# Patient Record
Sex: Female | Born: 1955 | Race: White | Hispanic: No | Smoking: Never smoker
Health system: Southern US, Community
[De-identification: ages and names within clinical notes are randomized; demographics above are authoritative.]

## PROBLEM LIST (undated history)

## (undated) DIAGNOSIS — G5600 Carpal tunnel syndrome, unspecified upper limb: Secondary | ICD-10-CM

## (undated) DIAGNOSIS — K760 Fatty (change of) liver, not elsewhere classified: Secondary | ICD-10-CM

## (undated) DIAGNOSIS — F32A Depression, unspecified: Secondary | ICD-10-CM

## (undated) DIAGNOSIS — K648 Other hemorrhoids: Secondary | ICD-10-CM

## (undated) DIAGNOSIS — I82409 Acute embolism and thrombosis of unspecified deep veins of unspecified lower extremity: Secondary | ICD-10-CM

## (undated) DIAGNOSIS — K219 Gastro-esophageal reflux disease without esophagitis: Secondary | ICD-10-CM

## (undated) DIAGNOSIS — E119 Type 2 diabetes mellitus without complications: Secondary | ICD-10-CM

## (undated) DIAGNOSIS — K222 Esophageal obstruction: Secondary | ICD-10-CM

## (undated) DIAGNOSIS — B029 Zoster without complications: Secondary | ICD-10-CM

## (undated) DIAGNOSIS — D126 Benign neoplasm of colon, unspecified: Secondary | ICD-10-CM

## (undated) DIAGNOSIS — F419 Anxiety disorder, unspecified: Secondary | ICD-10-CM

## (undated) DIAGNOSIS — D689 Coagulation defect, unspecified: Secondary | ICD-10-CM

## (undated) DIAGNOSIS — Z9889 Other specified postprocedural states: Secondary | ICD-10-CM

## (undated) DIAGNOSIS — K439 Ventral hernia without obstruction or gangrene: Secondary | ICD-10-CM

## (undated) DIAGNOSIS — I2510 Atherosclerotic heart disease of native coronary artery without angina pectoris: Secondary | ICD-10-CM

## (undated) DIAGNOSIS — J189 Pneumonia, unspecified organism: Secondary | ICD-10-CM

## (undated) DIAGNOSIS — K5790 Diverticulosis of intestine, part unspecified, without perforation or abscess without bleeding: Secondary | ICD-10-CM

## (undated) DIAGNOSIS — I10 Essential (primary) hypertension: Secondary | ICD-10-CM

## (undated) DIAGNOSIS — G43909 Migraine, unspecified, not intractable, without status migrainosus: Secondary | ICD-10-CM

## (undated) DIAGNOSIS — E785 Hyperlipidemia, unspecified: Secondary | ICD-10-CM

## (undated) DIAGNOSIS — M1990 Unspecified osteoarthritis, unspecified site: Secondary | ICD-10-CM

## (undated) DIAGNOSIS — N83209 Unspecified ovarian cyst, unspecified side: Secondary | ICD-10-CM

## (undated) HISTORY — DX: Migraine, unspecified, not intractable, without status migrainosus: G43.909

## (undated) HISTORY — PX: TRIGGER FINGER RELEASE: SHX641

## (undated) HISTORY — PX: OTHER SURGICAL HISTORY: SHX169

## (undated) HISTORY — DX: Benign neoplasm of colon, unspecified: D12.6

## (undated) HISTORY — DX: Gastro-esophageal reflux disease without esophagitis: K21.9

## (undated) HISTORY — PX: BREAST SURGERY: SHX581

## (undated) HISTORY — DX: Ventral hernia without obstruction or gangrene: K43.9

## (undated) HISTORY — DX: Carpal tunnel syndrome, unspecified upper limb: G56.00

## (undated) HISTORY — DX: Atherosclerotic heart disease of native coronary artery without angina pectoris: I25.10

## (undated) HISTORY — DX: Diverticulosis of intestine, part unspecified, without perforation or abscess without bleeding: K57.90

## (undated) HISTORY — PX: CHOLECYSTECTOMY: SHX55

## (undated) HISTORY — PX: ROTATOR CUFF REPAIR: SHX139

## (undated) HISTORY — DX: Zoster without complications: B02.9

## (undated) HISTORY — DX: Fatty (change of) liver, not elsewhere classified: K76.0

## (undated) HISTORY — DX: Esophageal obstruction: K22.2

## (undated) HISTORY — PX: ANKLE SURGERY: SHX546

## (undated) HISTORY — DX: Hyperlipidemia, unspecified: E78.5

## (undated) HISTORY — PX: CARDIAC CATHETERIZATION: SHX172

## (undated) HISTORY — DX: Other hemorrhoids: K64.8

## (undated) HISTORY — DX: Unspecified ovarian cyst, unspecified side: N83.209

## (undated) HISTORY — DX: Coagulation defect, unspecified: D68.9

## (undated) HISTORY — PX: CARPAL TUNNEL RELEASE: SHX101

## (undated) HISTORY — DX: Acute embolism and thrombosis of unspecified deep veins of unspecified lower extremity: I82.409

## (undated) HISTORY — DX: Anxiety disorder, unspecified: F41.9

---

## 1997-08-27 ENCOUNTER — Ambulatory Visit (HOSPITAL_BASED_OUTPATIENT_CLINIC_OR_DEPARTMENT_OTHER): Admission: RE | Admit: 1997-08-27 | Discharge: 1997-08-27 | Payer: Self-pay | Admitting: Orthopedic Surgery

## 1997-09-21 ENCOUNTER — Ambulatory Visit (HOSPITAL_COMMUNITY): Admission: RE | Admit: 1997-09-21 | Discharge: 1997-09-21 | Payer: Self-pay | Admitting: Pediatrics

## 1998-02-05 ENCOUNTER — Ambulatory Visit (HOSPITAL_COMMUNITY): Admission: RE | Admit: 1998-02-05 | Discharge: 1998-02-05 | Payer: Self-pay | Admitting: Gastroenterology

## 1998-09-14 ENCOUNTER — Ambulatory Visit (HOSPITAL_BASED_OUTPATIENT_CLINIC_OR_DEPARTMENT_OTHER): Admission: RE | Admit: 1998-09-14 | Discharge: 1998-09-14 | Payer: Self-pay | Admitting: Orthopedic Surgery

## 1998-12-05 ENCOUNTER — Inpatient Hospital Stay (HOSPITAL_COMMUNITY): Admission: EM | Admit: 1998-12-05 | Discharge: 1998-12-07 | Payer: Self-pay | Admitting: Emergency Medicine

## 1998-12-05 ENCOUNTER — Encounter: Payer: Self-pay | Admitting: Emergency Medicine

## 1999-01-01 ENCOUNTER — Encounter: Payer: Self-pay | Admitting: *Deleted

## 1999-01-01 ENCOUNTER — Emergency Department (HOSPITAL_COMMUNITY): Admission: EM | Admit: 1999-01-01 | Discharge: 1999-01-01 | Payer: Self-pay | Admitting: Emergency Medicine

## 1999-01-10 ENCOUNTER — Observation Stay (HOSPITAL_COMMUNITY): Admission: RE | Admit: 1999-01-10 | Discharge: 1999-01-12 | Payer: Self-pay | Admitting: General Surgery

## 1999-01-10 ENCOUNTER — Encounter: Payer: Self-pay | Admitting: General Surgery

## 1999-01-10 ENCOUNTER — Encounter (INDEPENDENT_AMBULATORY_CARE_PROVIDER_SITE_OTHER): Payer: Self-pay

## 1999-02-06 ENCOUNTER — Emergency Department (HOSPITAL_COMMUNITY): Admission: EM | Admit: 1999-02-06 | Discharge: 1999-02-06 | Payer: Self-pay | Admitting: Emergency Medicine

## 1999-03-11 ENCOUNTER — Ambulatory Visit (HOSPITAL_COMMUNITY): Admission: RE | Admit: 1999-03-11 | Discharge: 1999-03-11 | Payer: Self-pay | Admitting: Gastroenterology

## 1999-03-11 ENCOUNTER — Encounter: Payer: Self-pay | Admitting: Gastroenterology

## 1999-03-13 ENCOUNTER — Encounter: Payer: Self-pay | Admitting: Emergency Medicine

## 1999-03-13 ENCOUNTER — Encounter: Payer: Self-pay | Admitting: Family Medicine

## 1999-03-14 ENCOUNTER — Inpatient Hospital Stay (HOSPITAL_COMMUNITY): Admission: EM | Admit: 1999-03-14 | Discharge: 1999-03-15 | Payer: Self-pay | Admitting: Emergency Medicine

## 1999-03-15 ENCOUNTER — Encounter: Payer: Self-pay | Admitting: Internal Medicine

## 1999-03-17 ENCOUNTER — Encounter: Payer: Self-pay | Admitting: Internal Medicine

## 1999-03-17 ENCOUNTER — Ambulatory Visit (HOSPITAL_COMMUNITY): Admission: RE | Admit: 1999-03-17 | Discharge: 1999-03-17 | Payer: Self-pay | Admitting: Internal Medicine

## 1999-04-05 ENCOUNTER — Encounter: Admission: RE | Admit: 1999-04-05 | Discharge: 1999-04-05 | Payer: Self-pay | Admitting: Family Medicine

## 1999-04-05 ENCOUNTER — Encounter: Payer: Self-pay | Admitting: Family Medicine

## 1999-04-06 ENCOUNTER — Emergency Department (HOSPITAL_COMMUNITY): Admission: EM | Admit: 1999-04-06 | Discharge: 1999-04-06 | Payer: Self-pay | Admitting: Emergency Medicine

## 1999-04-07 ENCOUNTER — Encounter: Admission: RE | Admit: 1999-04-07 | Discharge: 1999-04-07 | Payer: Self-pay | Admitting: Gastroenterology

## 1999-04-07 ENCOUNTER — Encounter: Payer: Self-pay | Admitting: Gastroenterology

## 1999-06-03 ENCOUNTER — Other Ambulatory Visit: Admission: RE | Admit: 1999-06-03 | Discharge: 1999-06-03 | Payer: Self-pay | Admitting: *Deleted

## 1999-09-03 ENCOUNTER — Encounter: Payer: Self-pay | Admitting: Emergency Medicine

## 1999-09-03 ENCOUNTER — Emergency Department (HOSPITAL_COMMUNITY): Admission: EM | Admit: 1999-09-03 | Discharge: 1999-09-03 | Payer: Self-pay

## 1999-10-07 ENCOUNTER — Ambulatory Visit (HOSPITAL_COMMUNITY): Admission: RE | Admit: 1999-10-07 | Discharge: 1999-10-07 | Payer: Self-pay | Admitting: Gastroenterology

## 1999-10-07 ENCOUNTER — Encounter (INDEPENDENT_AMBULATORY_CARE_PROVIDER_SITE_OTHER): Payer: Self-pay | Admitting: *Deleted

## 1999-10-07 ENCOUNTER — Encounter: Payer: Self-pay | Admitting: Gastroenterology

## 1999-12-06 ENCOUNTER — Ambulatory Visit (HOSPITAL_BASED_OUTPATIENT_CLINIC_OR_DEPARTMENT_OTHER): Admission: RE | Admit: 1999-12-06 | Discharge: 1999-12-06 | Payer: Self-pay | Admitting: Orthopedic Surgery

## 1999-12-21 ENCOUNTER — Encounter: Admission: RE | Admit: 1999-12-21 | Discharge: 2000-03-20 | Payer: Self-pay | Admitting: Anesthesiology

## 2000-04-23 ENCOUNTER — Ambulatory Visit (HOSPITAL_COMMUNITY): Admission: RE | Admit: 2000-04-23 | Discharge: 2000-04-23 | Payer: Self-pay | Admitting: Gastroenterology

## 2000-07-25 ENCOUNTER — Other Ambulatory Visit: Admission: RE | Admit: 2000-07-25 | Discharge: 2000-07-25 | Payer: Self-pay | Admitting: Internal Medicine

## 2000-07-29 ENCOUNTER — Encounter (INDEPENDENT_AMBULATORY_CARE_PROVIDER_SITE_OTHER): Payer: Self-pay

## 2000-07-29 ENCOUNTER — Encounter: Payer: Self-pay | Admitting: Emergency Medicine

## 2000-07-29 ENCOUNTER — Observation Stay (HOSPITAL_COMMUNITY): Admission: EM | Admit: 2000-07-29 | Discharge: 2000-07-31 | Payer: Self-pay | Admitting: Emergency Medicine

## 2001-01-10 ENCOUNTER — Encounter: Payer: Self-pay | Admitting: Emergency Medicine

## 2001-01-10 ENCOUNTER — Emergency Department (HOSPITAL_COMMUNITY): Admission: EM | Admit: 2001-01-10 | Discharge: 2001-01-11 | Payer: Self-pay | Admitting: Emergency Medicine

## 2001-03-01 ENCOUNTER — Encounter: Admission: RE | Admit: 2001-03-01 | Discharge: 2001-03-01 | Payer: Self-pay | Admitting: *Deleted

## 2001-04-18 ENCOUNTER — Encounter: Payer: Self-pay | Admitting: Emergency Medicine

## 2001-04-18 ENCOUNTER — Observation Stay (HOSPITAL_COMMUNITY): Admission: EM | Admit: 2001-04-18 | Discharge: 2001-04-19 | Payer: Self-pay | Admitting: Emergency Medicine

## 2001-08-28 ENCOUNTER — Inpatient Hospital Stay (HOSPITAL_COMMUNITY): Admission: EM | Admit: 2001-08-28 | Discharge: 2001-09-07 | Payer: Self-pay | Admitting: Emergency Medicine

## 2001-08-29 ENCOUNTER — Encounter: Payer: Self-pay | Admitting: Internal Medicine

## 2001-08-30 ENCOUNTER — Encounter: Payer: Self-pay | Admitting: Internal Medicine

## 2001-09-03 ENCOUNTER — Encounter: Payer: Self-pay | Admitting: Internal Medicine

## 2001-09-09 ENCOUNTER — Other Ambulatory Visit: Admission: RE | Admit: 2001-09-09 | Discharge: 2001-09-09 | Payer: Self-pay | Admitting: *Deleted

## 2001-09-26 ENCOUNTER — Other Ambulatory Visit: Admission: RE | Admit: 2001-09-26 | Discharge: 2001-09-26 | Payer: Self-pay | Admitting: Obstetrics and Gynecology

## 2001-09-30 ENCOUNTER — Encounter: Payer: Self-pay | Admitting: *Deleted

## 2001-09-30 ENCOUNTER — Encounter: Admission: RE | Admit: 2001-09-30 | Discharge: 2001-09-30 | Payer: Self-pay | Admitting: *Deleted

## 2001-10-12 ENCOUNTER — Encounter: Payer: Self-pay | Admitting: Emergency Medicine

## 2001-10-12 ENCOUNTER — Inpatient Hospital Stay (HOSPITAL_COMMUNITY): Admission: EM | Admit: 2001-10-12 | Discharge: 2001-10-15 | Payer: Self-pay | Admitting: Internal Medicine

## 2001-10-15 ENCOUNTER — Encounter: Payer: Self-pay | Admitting: Internal Medicine

## 2001-12-25 ENCOUNTER — Ambulatory Visit (HOSPITAL_COMMUNITY): Admission: RE | Admit: 2001-12-25 | Discharge: 2001-12-25 | Payer: Self-pay | Admitting: *Deleted

## 2002-01-01 ENCOUNTER — Encounter: Payer: Self-pay | Admitting: Orthopedic Surgery

## 2002-01-07 ENCOUNTER — Inpatient Hospital Stay (HOSPITAL_COMMUNITY): Admission: RE | Admit: 2002-01-07 | Discharge: 2002-01-13 | Payer: Self-pay | Admitting: Orthopedic Surgery

## 2002-04-15 ENCOUNTER — Other Ambulatory Visit: Admission: RE | Admit: 2002-04-15 | Discharge: 2002-04-15 | Payer: Self-pay | Admitting: Obstetrics and Gynecology

## 2002-06-25 ENCOUNTER — Ambulatory Visit (HOSPITAL_COMMUNITY): Admission: RE | Admit: 2002-06-25 | Discharge: 2002-06-25 | Payer: Self-pay | Admitting: Orthopedic Surgery

## 2002-08-13 ENCOUNTER — Other Ambulatory Visit: Admission: RE | Admit: 2002-08-13 | Discharge: 2002-08-13 | Payer: Self-pay | Admitting: Obstetrics and Gynecology

## 2002-08-21 ENCOUNTER — Emergency Department (HOSPITAL_COMMUNITY): Admission: EM | Admit: 2002-08-21 | Discharge: 2002-08-21 | Payer: Self-pay | Admitting: Emergency Medicine

## 2002-08-21 ENCOUNTER — Encounter: Payer: Self-pay | Admitting: Emergency Medicine

## 2002-10-15 ENCOUNTER — Encounter: Payer: Self-pay | Admitting: Orthopedic Surgery

## 2002-10-15 ENCOUNTER — Ambulatory Visit (HOSPITAL_COMMUNITY): Admission: RE | Admit: 2002-10-15 | Discharge: 2002-10-15 | Payer: Self-pay | Admitting: Orthopedic Surgery

## 2002-11-26 ENCOUNTER — Encounter: Payer: Self-pay | Admitting: Internal Medicine

## 2002-11-26 ENCOUNTER — Encounter: Admission: RE | Admit: 2002-11-26 | Discharge: 2002-11-26 | Payer: Self-pay | Admitting: Internal Medicine

## 2002-12-03 ENCOUNTER — Encounter (INDEPENDENT_AMBULATORY_CARE_PROVIDER_SITE_OTHER): Payer: Self-pay

## 2002-12-03 ENCOUNTER — Inpatient Hospital Stay (HOSPITAL_COMMUNITY): Admission: RE | Admit: 2002-12-03 | Discharge: 2002-12-09 | Payer: Self-pay | Admitting: Orthopedic Surgery

## 2002-12-03 ENCOUNTER — Encounter: Payer: Self-pay | Admitting: Orthopedic Surgery

## 2002-12-04 ENCOUNTER — Encounter: Payer: Self-pay | Admitting: Orthopedic Surgery

## 2002-12-04 ENCOUNTER — Encounter: Payer: Self-pay | Admitting: Endocrinology

## 2002-12-05 ENCOUNTER — Encounter: Payer: Self-pay | Admitting: Orthopedic Surgery

## 2003-02-26 ENCOUNTER — Ambulatory Visit (HOSPITAL_COMMUNITY): Admission: RE | Admit: 2003-02-26 | Discharge: 2003-02-26 | Payer: Self-pay | Admitting: Orthopedic Surgery

## 2003-03-20 ENCOUNTER — Encounter: Payer: Self-pay | Admitting: Emergency Medicine

## 2003-03-20 ENCOUNTER — Emergency Department (HOSPITAL_COMMUNITY): Admission: EM | Admit: 2003-03-20 | Discharge: 2003-03-20 | Payer: Self-pay | Admitting: Emergency Medicine

## 2003-09-30 ENCOUNTER — Other Ambulatory Visit: Admission: RE | Admit: 2003-09-30 | Discharge: 2003-09-30 | Payer: Self-pay | Admitting: Obstetrics and Gynecology

## 2003-10-05 ENCOUNTER — Encounter: Admission: RE | Admit: 2003-10-05 | Discharge: 2003-10-05 | Payer: Self-pay | Admitting: Obstetrics and Gynecology

## 2003-11-02 ENCOUNTER — Emergency Department (HOSPITAL_COMMUNITY): Admission: EM | Admit: 2003-11-02 | Discharge: 2003-11-03 | Payer: Self-pay | Admitting: Emergency Medicine

## 2004-03-04 ENCOUNTER — Emergency Department (HOSPITAL_COMMUNITY): Admission: EM | Admit: 2004-03-04 | Discharge: 2004-03-04 | Payer: Self-pay | Admitting: Emergency Medicine

## 2004-03-10 ENCOUNTER — Emergency Department (HOSPITAL_COMMUNITY): Admission: EM | Admit: 2004-03-10 | Discharge: 2004-03-11 | Payer: Self-pay | Admitting: Emergency Medicine

## 2004-03-11 ENCOUNTER — Emergency Department (HOSPITAL_COMMUNITY): Admission: EM | Admit: 2004-03-11 | Discharge: 2004-03-11 | Payer: Self-pay | Admitting: Emergency Medicine

## 2004-08-27 ENCOUNTER — Emergency Department (HOSPITAL_COMMUNITY): Admission: EM | Admit: 2004-08-27 | Discharge: 2004-08-27 | Payer: Self-pay | Admitting: Emergency Medicine

## 2005-09-06 ENCOUNTER — Ambulatory Visit (HOSPITAL_COMMUNITY): Admission: RE | Admit: 2005-09-06 | Discharge: 2005-09-06 | Payer: Self-pay | Admitting: *Deleted

## 2005-09-06 ENCOUNTER — Encounter (INDEPENDENT_AMBULATORY_CARE_PROVIDER_SITE_OTHER): Payer: Self-pay | Admitting: *Deleted

## 2005-10-08 ENCOUNTER — Emergency Department (HOSPITAL_COMMUNITY): Admission: EM | Admit: 2005-10-08 | Discharge: 2005-10-08 | Payer: Self-pay | Admitting: Family Medicine

## 2005-10-14 ENCOUNTER — Ambulatory Visit (HOSPITAL_COMMUNITY): Admission: RE | Admit: 2005-10-14 | Discharge: 2005-10-14 | Payer: Self-pay | Admitting: Orthopedic Surgery

## 2005-11-23 ENCOUNTER — Ambulatory Visit (HOSPITAL_BASED_OUTPATIENT_CLINIC_OR_DEPARTMENT_OTHER): Admission: RE | Admit: 2005-11-23 | Discharge: 2005-11-23 | Payer: Self-pay | Admitting: Orthopedic Surgery

## 2006-01-31 ENCOUNTER — Emergency Department (HOSPITAL_COMMUNITY): Admission: EM | Admit: 2006-01-31 | Discharge: 2006-02-01 | Payer: Self-pay | Admitting: Emergency Medicine

## 2006-02-10 ENCOUNTER — Ambulatory Visit (HOSPITAL_COMMUNITY): Admission: RE | Admit: 2006-02-10 | Discharge: 2006-02-10 | Payer: Self-pay | Admitting: Orthopedic Surgery

## 2006-05-29 HISTORY — PX: JOINT REPLACEMENT: SHX530

## 2006-10-31 ENCOUNTER — Emergency Department (HOSPITAL_COMMUNITY): Admission: EM | Admit: 2006-10-31 | Discharge: 2006-10-31 | Payer: Self-pay | Admitting: Emergency Medicine

## 2007-06-13 ENCOUNTER — Emergency Department (HOSPITAL_COMMUNITY): Admission: EM | Admit: 2007-06-13 | Discharge: 2007-06-13 | Payer: Self-pay | Admitting: Emergency Medicine

## 2007-08-08 ENCOUNTER — Emergency Department (HOSPITAL_COMMUNITY): Admission: EM | Admit: 2007-08-08 | Discharge: 2007-08-08 | Payer: Self-pay | Admitting: Emergency Medicine

## 2007-08-15 ENCOUNTER — Ambulatory Visit (HOSPITAL_COMMUNITY): Admission: RE | Admit: 2007-08-15 | Discharge: 2007-08-15 | Payer: Self-pay | Admitting: *Deleted

## 2007-08-15 ENCOUNTER — Encounter (INDEPENDENT_AMBULATORY_CARE_PROVIDER_SITE_OTHER): Payer: Self-pay | Admitting: *Deleted

## 2007-12-18 ENCOUNTER — Emergency Department (HOSPITAL_COMMUNITY): Admission: EM | Admit: 2007-12-18 | Discharge: 2007-12-19 | Payer: Self-pay | Admitting: Family Medicine

## 2008-01-03 ENCOUNTER — Encounter: Admission: RE | Admit: 2008-01-03 | Discharge: 2008-01-03 | Payer: Self-pay | Admitting: Internal Medicine

## 2008-01-16 ENCOUNTER — Encounter (INDEPENDENT_AMBULATORY_CARE_PROVIDER_SITE_OTHER): Payer: Self-pay | Admitting: *Deleted

## 2008-01-16 ENCOUNTER — Ambulatory Visit (HOSPITAL_COMMUNITY): Admission: RE | Admit: 2008-01-16 | Discharge: 2008-01-16 | Payer: Self-pay | Admitting: *Deleted

## 2008-05-13 ENCOUNTER — Ambulatory Visit (HOSPITAL_COMMUNITY): Admission: RE | Admit: 2008-05-13 | Discharge: 2008-05-13 | Payer: Self-pay | Admitting: Internal Medicine

## 2008-05-14 ENCOUNTER — Inpatient Hospital Stay (HOSPITAL_COMMUNITY): Admission: AD | Admit: 2008-05-14 | Discharge: 2008-05-17 | Payer: Self-pay | Admitting: Internal Medicine

## 2008-05-23 ENCOUNTER — Inpatient Hospital Stay (HOSPITAL_COMMUNITY): Admission: EM | Admit: 2008-05-23 | Discharge: 2008-05-26 | Payer: Self-pay | Admitting: Emergency Medicine

## 2008-09-05 ENCOUNTER — Emergency Department (HOSPITAL_COMMUNITY): Admission: EM | Admit: 2008-09-05 | Discharge: 2008-09-05 | Payer: Self-pay | Admitting: Family Medicine

## 2008-11-25 ENCOUNTER — Ambulatory Visit (HOSPITAL_COMMUNITY): Admission: RE | Admit: 2008-11-25 | Discharge: 2008-11-25 | Payer: Self-pay | Admitting: *Deleted

## 2009-03-29 DEATH — deceased

## 2009-04-01 ENCOUNTER — Emergency Department (HOSPITAL_COMMUNITY): Admission: EM | Admit: 2009-04-01 | Discharge: 2009-04-02 | Payer: Self-pay | Admitting: Emergency Medicine

## 2009-05-06 ENCOUNTER — Inpatient Hospital Stay (HOSPITAL_COMMUNITY): Admission: EM | Admit: 2009-05-06 | Discharge: 2009-05-08 | Payer: Self-pay | Admitting: Emergency Medicine

## 2009-10-17 ENCOUNTER — Emergency Department (HOSPITAL_COMMUNITY): Admission: EM | Admit: 2009-10-17 | Discharge: 2009-10-18 | Payer: Self-pay | Admitting: Emergency Medicine

## 2010-06-19 ENCOUNTER — Encounter: Payer: Self-pay | Admitting: *Deleted

## 2010-06-19 ENCOUNTER — Encounter: Payer: Self-pay | Admitting: Sports Medicine

## 2010-08-30 LAB — DIFFERENTIAL
Eosinophils Absolute: 0.1 10*3/uL (ref 0.0–0.7)
Lymphocytes Relative: 35 % (ref 12–46)
Lymphs Abs: 2.6 10*3/uL (ref 0.7–4.0)
Monocytes Relative: 8 % (ref 3–12)
Neutro Abs: 4.3 10*3/uL (ref 1.7–7.7)
Neutrophils Relative %: 56 % (ref 43–77)

## 2010-08-30 LAB — PROTIME-INR
INR: 1.1 (ref 0.00–1.49)
Prothrombin Time: 14.1 seconds (ref 11.6–15.2)

## 2010-08-30 LAB — LIPID PANEL
Cholesterol: 81 mg/dL (ref 0–200)
HDL: 37 mg/dL — ABNORMAL LOW (ref >39)
Triglycerides: 145 mg/dL (ref <150)

## 2010-08-30 LAB — BASIC METABOLIC PANEL
CO2: 31 mEq/L (ref 19–32)
Calcium: 8.9 mg/dL (ref 8.4–10.5)
Chloride: 100 mEq/L (ref 96–112)
Chloride: 104 mEq/L (ref 96–112)
Creatinine, Ser: 0.99 mg/dL (ref 0.4–1.2)
GFR calc Af Amer: >60 mL/min (ref >60)
GFR calc Af Amer: >60 mL/min (ref >60)
GFR calc non Af Amer: 58 mL/min — ABNORMAL LOW (ref >60)
Glucose, Bld: 146 mg/dL — ABNORMAL HIGH (ref 70–99)
Sodium: 138 mEq/L (ref 135–145)

## 2010-08-30 LAB — POCT CARDIAC MARKERS
CKMB, poc: <1 ng/mL — ABNORMAL LOW (ref 1.0–8.0)
Myoglobin, poc: 78.8 ng/mL (ref 12–200)
Troponin i, poc: <0.05 ng/mL (ref 0.00–0.09)

## 2010-08-30 LAB — HEPARIN LEVEL (UNFRACTIONATED): Heparin Unfractionated: 0.44 IU/mL (ref 0.30–0.70)

## 2010-08-30 LAB — CBC
Hemoglobin: 10.9 g/dL — ABNORMAL LOW (ref 12.0–15.0)
MCHC: 34.4 g/dL (ref 30.0–36.0)
MCV: 85.9 fL (ref 78.0–100.0)
MCV: 86.9 fL (ref 78.0–100.0)
Platelets: 189 10*3/uL (ref 150–400)
RBC: 3.63 MIL/uL — ABNORMAL LOW (ref 3.87–5.11)
RBC: 4.02 MIL/uL (ref 3.87–5.11)
RBC: 4.09 MIL/uL (ref 3.87–5.11)
RDW: 13.9 % (ref 11.5–15.5)
WBC: 7.2 10*3/uL (ref 4.0–10.5)
WBC: 7.6 10*3/uL (ref 4.0–10.5)

## 2010-08-30 LAB — TROPONIN I: Troponin I: 0.02 ng/mL (ref 0.00–0.06)

## 2010-08-30 LAB — TSH: TSH: 1.228 u[IU]/mL (ref 0.350–4.500)

## 2010-08-30 LAB — CARDIAC PANEL(CRET KIN+CKTOT+MB+TROPI)
CK, MB: 1.5 ng/mL (ref 0.3–4.0)
Total CK: 100 U/L (ref 7–177)
Troponin I: 0.01 ng/mL (ref 0.00–0.06)

## 2010-08-30 LAB — APTT: aPTT: >200 seconds (ref 24–37)

## 2010-08-30 LAB — CK TOTAL AND CKMB (NOT AT ARMC)
Relative Index: INVALID (ref 0.0–2.5)
Total CK: 96 U/L (ref 7–177)

## 2010-08-31 LAB — URINALYSIS, ROUTINE W REFLEX MICROSCOPIC
Glucose, UA: NEGATIVE mg/dL
pH: 6.5 (ref 5.0–8.0)

## 2010-08-31 LAB — URINE MICROSCOPIC-ADD ON

## 2010-08-31 LAB — CBC
MCHC: 34.4 g/dL (ref 30.0–36.0)
Platelets: 211 10*3/uL (ref 150–400)
RDW: 13.5 % (ref 11.5–15.5)

## 2010-08-31 LAB — BASIC METABOLIC PANEL
BUN: 18 mg/dL (ref 6–23)
CO2: 27 mEq/L (ref 19–32)
Calcium: 9.1 mg/dL (ref 8.4–10.5)
Creatinine, Ser: 1.19 mg/dL (ref 0.4–1.2)
GFR calc non Af Amer: 47 mL/min — ABNORMAL LOW (ref >60)
Glucose, Bld: 99 mg/dL (ref 70–99)
Sodium: 139 mEq/L (ref 135–145)

## 2010-08-31 LAB — DIFFERENTIAL
Basophils Absolute: 0 10*3/uL (ref 0.0–0.1)
Basophils Relative: 1 % (ref 0–1)
Lymphocytes Relative: 37 % (ref 12–46)
Monocytes Absolute: 0.6 10*3/uL (ref 0.1–1.0)
Neutro Abs: 3.7 10*3/uL (ref 1.7–7.7)
Neutrophils Relative %: 53 % (ref 43–77)

## 2010-09-07 LAB — POCT RAPID STREP A (OFFICE): Streptococcus, Group A Screen (Direct): NEGATIVE

## 2010-10-11 NOTE — Cardiovascular Report (Signed)
Karen Scott, AUGELLO NO.:  [id number]   MEDICAL RECORD NO.:  [id number]          PATIENT TYPE:  INP   LOCATION:  2021                         FACILITY:  [facility]   PHYSICIAN:  [name], M.D.     DATE OF BIRTH:  [birth year]   DATE OF PROCEDURE:  DATE OF DISCHARGE:                            CARDIAC CATHETERIZATION   INDICATIONS:  Karen Scott. Karen Scott is a 55 year old female who has a  history of hypertension, as well as diverticular disease.  She admits to  being under increased stress recently.  She has noticed episodes of  chest pressure which she states is like a band squeezing her chest.  She  was admitted to [facility] on May 23, 2008.  Cardiac  enzymes were negative.  Total cholesterol was 124; however,  triglycerides were elevated and HDL was low.  There were worrisome  symptomatology, cardiac catheterization was recommended.   PROCEDURE:  After premedication with Versed 2 mg intravenously, the  patient was prepped and draped in usual fashion.  She was still nervous  and consequently, she received 50 mg of additional fentanyl.  The right  femoral artery was punctured anteriorly and a 5-French sheath was  inserted.  Diagnostic cardiac catheterization was done utilizing 5-  J[place]ica Judkins for a left catheter.  A 200 mcg intracoronary  nitroglycerin was selectively administered down the left coronary system  to evaluate her LAD spasm/muscle bridging.  A 5-French FR-4 catheter was  used for selective angiography into the right coronary system.  A 5-  French pigtail catheter was used for biplane cine left ventriculography.  With the patient's hypertensive history, distal aortography was also  performed to make sure she did not have any renovascular etiology.  Hemostasis was obtained by direct manual pressure.  She tolerated the  procedure well.   Hemodynamic data:  Central aortic pressure is 125/80.  Left ventricular  pressure 125/11.   Angiographic data:  Left main coronary artery was angiographically  normal and bifurcated into an LAD and left circumflex system.   The LAD gave rise to a prominent first diagonal vessel which had 50%  ostial narrowing in the diagonal vessel.  The midportion of the LAD had  segmental smooth appearing narrowing of 50% and then seemed to narrow at  80% with muscle bridging.  However, following administration of 200 mcg  of intracoronary nitroglycerin, the LAD was widely opened with complete  resolution of spasm.  There was no change in the ostial diagonal lesion  of 50%.   The circumflex vessel gave rise to a high marginal small almost ramus  intermediate-like vessel and then gave rise to two marginal vessels.  The circumflex was free of significant disease.   The right coronary artery was a large caliber, normal dominant vessel,  which gave rise to a prominent PDA, 2 inferior LV branches, and ended in  a large posterolateral vessel.  The RCA and its branches were normal.   Biplane cine left ventriculography revealed normal LV contractility  without focal segmental wall motion abnormality.   Distal aortography revealed a tortuous  infrarenal aorta that was smooth  in contour.  The renal arteries were widely patent.  There did not  appear to be significant aortoiliac disease.   IMPRESSION:  1. Normal left ventricular function.  2. A 50% ostial narrowing of the first diagonal branch of the left      anterior descending with evidence for 50-80% mid left anterior      descending narrowing with spasm/muscle bridging, which normalized      in this mid left anterior descending segment following IC      nitroglycerin administration without change in the diagonal ostial      lesion.  3. Normal circumflex.  4. Normal right coronary artery.   RECOMMENDATIONS:  Medical therapy.  Due to the patient's hypertensive  history and spasm, amlodipine will be added to her medical regimen and  she will  be given a prescription for sublingual nitroglycerin.  Aggressive lipid intervention will be necessary with increasing her HDL  cholesterol.           ______________________________  [name], M.D.     TK/MEDQ  D:  05/25/2008  T:  05/26/2008  Job:  [id number]   cc:   Cristy Hilts. Jacinto Halim, MD  Soyla Murphy Renne Crigler, M.D.  [name], M.D.

## 2010-10-11 NOTE — Op Note (Signed)
NAME:  Karen Scott, Karen Scott           ACCOUNT NO.:  [id number]   MEDICAL RECORD NO.:  [id number]          PATIENT TYPE:  AMB   LOCATION:  ENDO                         FACILITY:  [facility]   PHYSICIAN:  Georgiana Spinner, M.D.    DATE OF BIRTH:  12-09-1955   DATE OF PROCEDURE:  01/16/2008  DATE OF DISCHARGE:                               OPERATIVE REPORT   PROCEDURE:  Colonoscopy.   INDICATIONS:  Diarrhea, colon polyps.   ANESTHESIA:  Demerol 100 mg, Versed 10 mg.   PROCEDURE:  With the patient mildly sedated in the left lateral  decubitus position, the Pentax videoscopic colonoscope was inserted in  the rectum and passed under direct vision through a rather poor prep,  especially in the left colon, to reach the cecum identified by crow's  foot of the cecum, ileocecal valve, both of which were photographed  after suctioning and clearing of the cecum.  The colonoscope was slowly  withdrawn taking circumferential views of colonic mucosa, stopping to  take random biopsies along the layer of normal-appearing mucosa and also  stopping at the hepatic flexure area where a small polyp was seen,  photographed and removed using the biopsy forceps.  As noted, the prep  was poor, especially in the left side.  We washed and suctioned as best  we could but there was vegetable material that precluded adequate  suctioning but the endoscope was withdrawn all the way to the rectum  which appeared normal on direct and showed normal on retroflex view.  The endoscope was straightened and withdrawn.  The patient's vital  signs, pulse oximeter remained stable.  The patient tolerated the  procedure well without apparent complication.   FINDINGS:  Small polyp of the hepatic flexure, otherwise unremarkable  examination.  Await biopsy report.  The patient will call me for results  and follow up with me as an outpatient.           ______________________________  Georgiana Spinner, M.D.     GMO/MEDQ  D:  01/16/2008   T:  01/16/2008  Job:  [id number]

## 2010-10-11 NOTE — Discharge Summary (Signed)
Karen Scott, Karen Scott                 ACCOUNT NO.:  [id number]   MEDICAL RECORD NO.:  [id number]          PATIENT TYPE:  INP   LOCATION:  5158                         FACILITY:  [facility]   PHYSICIAN:  Theodosia Paling, MD    DATE OF BIRTH:  05/12/1956   DATE OF ADMISSION:  05/14/2008  DATE OF DISCHARGE:  05/17/2008                               DISCHARGE SUMMARY   PRIMARY CARE PHYSICIAN:  Dr. Soyla Murphy. Pharr.   ADMITTING HISTORY:  Please refer to the excellent admission note  dictated by Dr. Pollie Friar history of present illness.   DISCHARGE DIAGNOSES:  1. Diverticulitis.  2. History of hypertension.  3. History of gastroesophageal reflux disease.  4. History of depression.  5. History of anxiety.  6. History of irritable bowel syndrome.   DISCHARGE MEDICATIONS:  1. New medication added p.o. ciprofloxacin 100 mg p.o. q.12 hours for      7 more days.  2. P.o. Flagyl 100 mg p.o. q.8 hours times 7 days.  3. Home medication to be continued, Diovan 320/25 mg daily.  4. Ambien 10 mg daily.  5. Detrol LA 4 mg p.o. daily.  6. Nexium 40 mg p.o. q.12 hours.  7. Xanax 0.5 mg once a day.  8. Zoloft 25 mg once a day.   HOSPITAL COURSE:  Following issues were addressed during the  hospitalization.  1. Diverticulitis.  The patient received IV antibiotics, IV pain      medication and IV fluids while she was admitted to hospital.      Slowly her pain got under control.  Also, she was able to tolerate      diet and was started on clear liquid diet and it was advanced      according to her tolerance.  The patient will be following up with      her primary care physician within 1 week's time.  2. History of hypertension.  Diovan was continued.  3. History of anxiety and depression.  Zoloft was continued.  4. History of GERD.  Nexium was continued during the hospitalization.   PROCEDURE PERFORMED:  None.   IMAGING PERFORMED:  CT scan of the abdomen and pelvis was performed on  May 13, 2008 which showed mild diverticulitis involving proximal  sigmoid colon, stable.  Left periumbilical abdominal wall hernia  containing only fat.   CONSULTATION PERFORMED:  None.   PROCEDURE PERFORMED:  None.   DISPOSITION:  The patient will follow up with Dr. Renne Crigler within 1 week  time for further evaluation and management.  Total time spent in  discharge of this patient 45 minutes.      Theodosia Paling, MD  Electronically Signed     NP/MEDQ  D:  05/17/2008  T:  05/17/2008  Job:  [id number]   cc:   Soyla Murphy. Renne Crigler, M.D.

## 2010-10-11 NOTE — Op Note (Signed)
NAME:  Karen Scott, SHEALY NO.:  [id number]   MEDICAL RECORD NO.:  [id number]          PATIENT TYPE:  AMB   LOCATION:  ENDO                         [facility]ACILITY:  [facility]   PHYSICIAN:  Georgiana Spinner, M.D.    DATE OF BIRTH:  15-Aug-1955   DATE OF PROCEDURE:  DATE OF DISCHARGE:                               OPERATIVE REPORT   PROCEDURE:  Upper endoscopy with biopsy and dilation.   INDICATIONS:  Dysphagia.   ANESTHESIA:  Demerol 50 mg, Versed 5 mg, Phenergan 25 mg was given prior  to the procedure.   With the patient mildly sedated in the left lateral decubitus position,  the Pentax videoscopic endoscope was inserted in the mouth, passed under  direct vision through the esophagus which appeared normal until we  reached the distal esophagus and there was an early stricture noted at  the squamocolumnar junction.  This was photographed.  We advanced the  endoscope past this rather easily into the stomach. The fundus, body,  antrum, duodenal bulb and second portion of duodenum were visualized.  From this point, the endoscope was slowly withdrawn taking  circumferential views of the duodenal mucosa until the endoscope was  then pulled back into the stomach placed in retroflexion to view the  stomach from below. The endoscope was straightened and biopsy of some  erythematous changes seen in the antrum were taken and also we biopsied  samples of fundic gland polyps in the fundus of the stomach.  The  endoscope was then advanced to the antrum and a guidewire was passed.  The endoscope was removed and under fluoroscopic control Savary dilators  of 15 and 17 were passed rather easily.  No blood was seen on either  dilator and with the latter the guidewire was removed. The endoscope was  reinserted into the esophagus and stomach.  No abnormalities or changes  were noted.  The endoscope was withdrawn.  The patient's vital signs and  pulse oximeter remained stable.  The patient tolerated  the procedure  well without apparent complication.   FINDINGS:  Esophageal stricture is noted. Fundic gland polyps biopsied,  antral erythema consistent with a localized gastritis biopsied.   PLAN:  Await clinical response and biopsy report.  The patient will call  me for results and follow-up with me as needed as an outpatient.           ______________________________  Georgiana Spinner, M.D.     GMO/MEDQ  D:  08/15/2007  T:  08/15/2007  Job:  [id number]

## 2010-10-11 NOTE — Op Note (Signed)
NAME:  Karen Scott, Karen Scott                 ACCOUNT NO.:  [id number]   MEDICAL RECORD NO.:  [id number]          PATIENT TYPE:  AMB   LOCATION:  ENDO                         [facility]ACILITY:  [facility]   PHYSICIAN:  Georgiana Spinner, M.D.    DATE OF BIRTH:  [birth year]   DATE OF PROCEDURE:  DATE OF DISCHARGE:                               OPERATIVE REPORT   PROCEDURE:  Colonoscopy.   INDICATIONS:  Hemoccult positivity, diverticulitis.   ANESTHESIA:  Fentanyl 60 mcg, Versed 5 mg.   PROCEDURE:  With the patient mildly sedated in the left lateral  decubitus position, the Pentax videoscopic pediatric colonoscope was  inserted in the rectum, passed through a somewhat tight tortuous sigmoid  colon to reach the cecum, identified by the ileocecal valve and base of  the cecum.  The prep was suboptimal in that there were areas of liquid  stool admixed with solid material that could not be suctioned because to  clogged the endoscope suction port, but from this point the colonoscope  was slowly withdrawn, taking circumferential views of the colonic  mucosa.  The left colon at this time was better cleared and no  abnormalities were seen as we withdrew all the way to the rectum, which  appeared normal on direct and showed hemorrhoids on retroflexed view.  The endoscope was straightened and withdrawn.  The patient's vital  signs, pulse oximeter remained stable.  The patient tolerated the  procedure well without apparent complications.   FINDINGS:  Occasional diverticula seen, thickened tortuous left colon  but no abnormalities seen other than internal hemorrhoids, other than  the diverticula.   PLAN:  To have patient follow up with me on as-needed basis and repeat  colonoscopy in 5-10 years as necessary for screening purposes.           ______________________________  Georgiana Spinner, M.D.     GMO/MEDQ  D:  11/25/2008  T:  11/25/2008  Job:  [id number]

## 2010-10-11 NOTE — H&P (Signed)
Karen Scott, Karen Scott                 ACCOUNT NO.:  [id number]   MEDICAL RECORD NO.:  [id number]          PATIENT TYPE:  INP   LOCATION:  5158                         FACILITY:  [facility]   PHYSICIAN:  Michelene Gardener, MD    DATE OF BIRTH:  [birth year]   DATE OF ADMISSION:  05/14/2008  DATE OF DISCHARGE:                              HISTORY & PHYSICAL   PRIMARY PHYSICIAN:  Soyla Murphy. Renne Crigler, MD   CHIEF COMPLAINT:  Increasing abdominal pain for the last 4 days  associated with diarrhea for 4 days and nausea and vomiting for 4 days.   HISTORY OF PRESENT ILLNESS:  This is a 55 year old Caucasian female with  past medical history of multiple problems who presented with the above-  mentioned complaint.  Condition started 4 days ago with abdominal pain  in the lower part of her abdomen.  Her pain was described as crampy pain  with episodes of a stabbing pain in the left lower quadrant.  She has  been having diarrhea which is watery without evidence of blood or mucus.  She had bowel movement around 6-10 per day and her last bowel movement  was this morning.  She is also complaining of nausea and vomiting where  she vomited twice yesterday and she vomited once today.  She does not  have any hematemesis.  She saw her doctor yesterday where CAT scan of  her abdomen was done and showed evidence of diverticulitis.  The patient  was prescribed Flagyl and Cipro as an outpatient.  The patient was not  able to take her medicine because she continued to vomit.  She was not  able to keep anything down so she was back to her doctor today who sent  her to the hospital for further evaluation.   PAST MEDICAL HISTORY:  Significant for:  1. Hypertension.  2. Irritable bowel syndrome.  3. 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  She used      to be in Coumadin and that has been stopped.  4. GERD.  5. History of dysphagia secondary to esophageal stricture.  6. Depression.  7. Anxiety.  8.  History of colonic polyps status post removal.  9. History of diverticulosis.  10.Internal hemorrhoids.  11.History of ovarian cyst.  12.Overactive bladder syndrome.  13.History of carpal tunnel syndrome.   PAST SURGICAL HISTORY:  1. Bilateral carpal tunnel release.  2. Right rotator cuff repair.  3. Bilateral ulnar release.  4. Right trigger finger and left finger cyst removal.  5. Cholecystectomy in August 2002.  6. Bilateral knee arthroscopy.  7. Lumpectomy of the left breast that came to be benign without      evidence of malignant tumor.  8. Tendon repair of the left ankle.   ALLERGIES:  The patient is allergic to PENICILLIN that gave uncertain  reaction and may seem to give uncertain reaction.   CURRENT MEDICATIONS:  1. Diovan 320/25 mg once a day.  2. Ambien 10 mg once a day.  3. Detrol LA 4 mg once a day.  4. Nexium 40 mg twice  daily.  5. Xanax 0.5 mg once a day.  6. Zoloft 25 mg once a day that was started yesterday.   SOCIAL HISTORY:  The patient has three marriages.  She became a widow  and then she is divorced and now in her third marriage.  Her daughter is  32 year old and her son is 57 year old.  She works as a Engineer, site for the day.  She denied using alcohol or tobacco.  She is  member of the [employer] but she has not been going for exercise  regularly.   FAMILY HISTORY:  Her father is alive and is 80 years old, had history of  glaucoma and a borderline diabetes and borderline hypertension.  Her  mother is 23 years old, history of hypertension.  Her mother also had  history of stroke and diabetes.  She has one sister who is alive and 10  years old with history of diabetes.   REVIEW OF SYSTEMS:  CONSTITUTIONAL:  Positive for fatigability.  There  is no fever.  EYES:  No blurred vision.  ENT:  No tinnitus.  Respiratory:  No cough or wheezes.  CARDIOVASCULAR:  No chest pain, no  shortness of breath.  GI:  Positive for nausea, vomiting,  abdominal pain  and diarrhea.  GU:  No dysuria, no hematuria.  ENDOCRINE:  No polyuria,  no nocturia.  HEMATOLOGY:  No bruises, no bleeding.  ID:  No rash, no  lesions.  NEURO:  No numbness, no tingling.  The rest of the systems  were reviewed and they were negative.   PHYSICAL EXAMINATION:  VITAL SIGNS:  Her vital signs from the primary  office is 98.4, blood pressure 122/90, pulse is 82.  Vitals were not  done in the hospital.  HEENT:  Her conjunctivae are pink.  Pupils are equal and reactive to  light.  There is no ptosis.  Hearing is intact.  There is no ear  discharge or infection.  There is no nose infection or bleeding.  Oral  mucosa is dry.  No pharyngeal erythema.  NECK:  Supple.  No JVD, no carotid bruit, no lymphadenopathy, no thyroid  enlargement or thyroid tenderness.  CARDIOVASCULAR:  S1 and S2 are  regular.  There are no murmurs, no gallops and no thrills.  RESPIRATORY:  The patient is breathing between 16-18.  There is no  rales, no rhonchi, no wheezes.  ABDOMEN:  Soft, not distended.  There is diffuse tenderness especially  in her left lower quadrant associated with some guarding.  There is no  rebound tenderness.  LOWER EXTREMITIES:  No edema, no rash and no varicose veins.  SKIN:  No rash and erythema.  NEURO:  Cranial nerves are intact from II through XII.  There is no  motor or sensory deficits.   LABORATORY RESULTS:  No labs were done in the hospital so far but the  labs that were done in her primary care office on May 13, 2008  showed WBC 8.7, hemoglobin 13.5, hematocrit 40.0, platelet count 220,  BUN 12, creatinine 1.2.  Sodium 136, potassium 4.4, chloride 95, bicarb  31, AST 21, ALT 38, alkaline phosphatase 99.  CT scan of the abdomen and  pelvis showed evidence of acute diverticulitis.   IMPRESSION:  1. Acute diverticulitis.  2. Hypertension.  3. Irritable bowel syndrome.  4. Gastroesophageal reflux disease.  5. Anxiety and depression.   PLAN:   The patient is diagnosed with diverticulitis.  She was started on  outpatient treatment and she failed outpatient treatment because of her  excessive nausea and her inability to keep down medications and food.  We will admit her to the floor.  I will not repeat her CAT scan since it  was done yesterday.  I will keep her n.p.o.  We will start her on IV  fluids normal saline at 100 mL an hour.  We will put her in antiemetics.  We will start pain medications as needed.  I will start her on Cipro 400  mg IV twice daily and Flagyl 500 mg IV three times daily.  I will get  basic labs including her CBC, BMET, LFTs, amylase and lipase.  We will  evaluate in the morning to restart high in liquid diet if she is  improving.   Otherwise, other medical conditions remained stable.  She will be  maintained in pre-admit medications and further management will depend  on her hospital course.   Total assessment time is 1 hour.      Michelene Gardener, MD  Electronically Signed     NAE/MEDQ  D:  05/14/2008  T:  05/14/2008  Job:  [id number]   cc:   Soyla Murphy. Renne Crigler, M.D.

## 2010-10-14 NOTE — Discharge Summary (Signed)
NAMESHAREEKA, YIM NO.:  [id number]   MEDICAL RECORD NO.:  [id number]          PATIENT TYPE:  INP   LOCATION:  2021                         FACILITY:  [facility]   PHYSICIAN:  Cristy Hilts. Jacinto Halim, MD       DATE OF BIRTH:  06-22-1955   DATE OF ADMISSION:  05/23/2008  DATE OF DISCHARGE:  05/26/2008                               DISCHARGE SUMMARY   DISCHARGE DIAGNOSES:  1. Unstable angina, secondary to coronary spasm.  2. Coronary spasm.  2A.  Minimal rise in troponin, negative myocardial infarction.  1. Hypertension.  2. Diverticulitis.  3. Ulcerative colitis.  4. Normal ejection fraction.  5. Dyslipidemia.   DISCHARGE CONDITION:  Improved and stable.   PROCEDURES:  On May 25, 2008, left heart cath by Dr. [name].   DISCHARGE MEDICATIONS:  1. Coreg 3.125 mg 1 twice a day.  2. Exforge 5/160 one daily.  3. Crestor 10 mg daily.  4. Nexium 40 mg daily.  5. Niaspan 500 mg 1 every evening 30 minutes after aspirin and with a      low-fat snack.  6. Aspirin 81 mg daily.  7. Nitroglycerin 1/150, 1 under the tongue if needed for chest pain      while sitting, 1 every 5 minutes up to 3/15.  If pain continues,      call 911.  8. Ambien 5 mg at bedtime as before.  9. Xanax 0.5 mg 1 if needed as before.  10.Percocet 5/325 one daily up to 4 times a day as needed as before.  11.Detrol 4 mg daily.   Stop Diovan HCT.  You have completed your Cipro and Flagyl.   DISCHARGE INSTRUCTIONS:  1. Low-sodium, heart-healthy diet.  2. Increase activity slowly.  May shower.  No lifting for 2 days.  No      driving for 2 days.  3. Wash right groin cath site with soap and water.  Call if any      bleeding, swelling, or drainage.  4. Follow up with Dr. Tresa Endo, June 08, 2008, at 3:30 p.m.   HISTORY OF PRESENT ILLNESS:  A 55 year old female patient was seen for  chest pain and mildly elevated troponin.  She was going to get into the  bathroom, had onset of squeezing chest pain,  took 2 aspirins, had  clamminess, nausea, and diarrhea.  Chest pain increased, became short of  breath and pale.  Sounds had radiation to her left arm, 911 was called,  nitroglycerin was given to the EMS with improvement from 12/1 pain to  6/10.  On exam, she was still having mild residual chest discomfort.   PAST MEDICAL HISTORY:  1. Hypertension.  2. Diverticulitis.  3. Irritable bowel syndrome.  4. Cholesterol.   FAMILY HISTORY:  Without coronary artery disease.  Mother was positive  for hypertension, diabetes, and hyperlipidemia.   SOCIAL HISTORY:  Married.  No exercise.  She does snore.  Her husband  has stage IV esophageal cancer and she uses primary support system.   REVIEW OF SYSTEMS:  See H and P.  PHYSICAL EXAMINATION AT DISCHARGE:  VITAL SIGNS:  Blood pressure 124/81,  sinus rhythm with rate of 70 and afebrile, and oxygen saturation 98%.  Hemoglobin was stable.  NECK:  No JVD.  LUNGS:  Clear without rales.  HEART:  Regular rate and rhythm without S4 gallop.  Right groin cath  site was stable.  EYES:  Sclerae are clear.   LABORATORY DATA:  Hemoglobin 12.5, hematocrit 37.3, WBC 4.6, platelets  245, neutrophils 69, lymphs 25, monos 6, eos 0, baso 1, and she has  remained stable.  D-dimer was low at 0.29.   Protime 12.8, INR of 1, and PTT of 35.   Sodium 137, potassium 3.6, chloride 104, CO2 of 26, glucose 134, BUN 14,  creatinine 0.96, not remained stable, total protein 6.1, albumin 3.3,  AST 20, ALT 19, alkaline phos 54, total bili 0.4, magnesium 2.4.  BNP  was 32 on admission.   Cardiac enzymes CK 81, 80, 66, 63, and 61.  MBs were all negative at  1.4, 1.5, and 1.1.  Troponin I is 0.08, 0.09, 0.07, 0.04, and 0.03.   Total cholesterol is 124, triglycerides were elevated at 236, HDL 26 and  LDL 51.  TSH 1.343.  Urine was cloudy with small amount of leukocyte  esterase, few bacteria, and many of epithelials.   EKG; sinus rhythm, left anterior fascicular block,  no significant change  and this remained stable.  At one point, she had PR of 212 msec, but at  discharge, it was 206 msec.   Chest x-ray revealed poor inspiration.  No definite active process, I  cannot rule out mild basilar atelectasis.  No effusions or chronic  scoliosis.   Cardiac catheterization, 50% ostial narrowing of the first diagonal  branch of the LAD with evidence for 60-80% mid-LAD narrowing with spasm  and muscle bridging, which normalized in the mid left anterior  descending segments only, intracoronary nitroglycerin without change in  the diagonal ostial lesion, normal circumflex, normal RCA, and normal LV  function.   HOSPITAL COURSE:  The patient was admitted from the emergency room, we  had not seen her previously secondary to chest pain, which relieved with  sublingual nitroglycerin.  She was admitted to monitor bed.  Normal rise  in troponin I, and plans were for cardiac catheterization.  She was  placed on subcu Lovenox 1 mg/kg and nitroglycerin sublingual on a p.r.n.  basis.   She was monitored.  Coreg was added to medical regimen.  Arranged for  cardiac catheterization.  She underwent cardiac cath May 25, 2008,  with results as previously stated.  On May 26, 2008, she was  stable.  No further chest pain.  We stopped her Diovan HCT and put her  on Exforge, secondary to coronary spasm.  Also, added Niaspan for her  dyslipidemia.  She will follow up in the office with Dr. Tresa Endo.  She was  seen and discharged by Dr. Tresa Endo.      Darcella Gasman. [place], N.P.      Cristy Hilts. Jacinto Halim, MD  Electronically Signed    LRI/MEDQ  D:  06/29/2008  T:  06/30/2008  Job:  [id number]   cc:   Soyla Murphy. Renne Crigler, M.D.  [name], M.D.  Cristy Hilts. Jacinto Halim, MD

## 2010-10-14 NOTE — Discharge Summary (Signed)
NAME:  Karen Scott, Karen Scott                           ACCOUNT NO.:  [id number]   MEDICAL RECORD NO.:  [id number]                   PATIENT TYPE:  INP   LOCATION:  5009                                 FACILITY:  [facility]   PHYSICIAN:  Deidre Ala, M.D.                 DATE OF BIRTH:  20-May-1956   DATE OF ADMISSION:  01/07/2002  DATE OF DISCHARGE:  01/13/2002                                 DISCHARGE SUMMARY   ADMISSION DIAGNOSIS:  End-stage degenerative joint disease right knee.   DISCHARGE DIAGNOSIS:  End-stage degenerative joint disease right knee,  status post right total knee arthroplasty.   HOSPITAL COURSE:  The patient was admitted to [facility], taken to the operating room on January 07, 2002, where she  underwent a right total knee arthroplasty which was performed by Dr. Renae Fickle,  with assistance by Dr. Jerl Santos and Anise Salvo. Clark, P.A.-C.  The knee was  replaced with a Dupuy LCS rotating stent cemented components done under  general anesthesia with postoperative femoral nerve block, a 150 cc blood  loss and a tourniquet time of one hour and 11 minutes.  She was taken to the  recovery room in stable condition with PCA morphine and both Coumadin and  Lovenox were restarted.   On postoperative day #1, January 08, 2002, she was resting in a chair in a  moderate amount of pain but well controlled with PCA.  Her vital signs were  stable with maximal temperature 98.3.  The right knee wound was benign.  The  dressings were clean and dry with no active drainage.  The calf was soft and  nontender.  The lower extremity was intact neurovascularly.  Her PT was 15.4  with an INR of 1.2.  Her white count was 5.9, hemoglobin 8.9 down from 13.2  preoperatively with hematocrit of 26.2, and a platelet count of 167.  Her  chemistry panel showed everything was normal except for her potassium was  low at 2.9, glucose elevated at 137 and calcium low at 7.5.  Plan was to put  her on  regular dose of Percocet 325/5 mg two p.o. q.4h, potassium chloride  20 mEq one p.o. q.d., Os-Cal D 600/200 three p.o. q.d., and rehab.  PT and  OT were consulted.   On postoperative day #2, January 09, 2002, she was complaining of moderate  pain.  She was occasionally using the PCA.  She had had some nausea the day  before and she had been ambulating a little bit in the hall.  Vital signs  were stable.  She was afebrile.  The wound was benign.  There was no  drainage and the drain was discontinued.  The calf was soft and nontender  and the lower extremity was intact neurovascularly.  PT was 15.0 and INR  1.2.  White count 6.4, hemoglobin 8.8, hematocrit 26.2, platelet  count 186.  Chemistry panel was normal except for a potassium which was low at 3.1 and a  glucose which was elevated at 125.  Ordered to attempt to wean her off the  PCA over the weekend, anticipating a discharge over the weekend versus rehab  placement.  She was approved for rehab bed, however, insurance refused to  pay for it.  Durable medical equipment, including rolling walker, three-in-  one, were ordered and delivered.   On January 10, 2002, postoperative day #3, she was resting comfortably in a  chair.  She was having no pain when she was at rest.  She was having a  little bit of pain with motion and ambulation.  She was still using PCA.  She was having people available to come in at various times but no one was  going to be around the clock.  Insurance was denying coverage of the rehab.  Vital signs were stable.  She was afebrile. The knee wound was benign.  Extremity was intact neurovascularly.  The calf was soft and nontender.  PT  was 18.0 and INR 1.6.  Hemoglobin 8.1 and hematocrit 24.1. Chemistry panel  still had potassium low at 3.1, BUN low at 5.  Decision was made to  transfuse her two units of packed red blood cells, repeat an H&H.  PCA was  discontinued.  The IV was discontinued down to saline after  transfusions.  She was given OxyContin 20 mg one p.o. b.i.d., Percocet one to two p.o.  q.4h. for breakthrough pain and morphine 2 to 3 mg IV q.3-4h. for severe  pain.  Anticipating we will discharge her Sunday or Monday pending home  coverage and her response to the transfusion.   Discharge planning was consulted on January 11, 2002, postoperative day #4.  She was having some right knee pain. She was doing well with PT.  She was  eating and voiding okay.  She was afebrile with stable vital signs.  The  knee dressing was clean and dry.  She had some mild ankle swelling.  The  calf was soft and nontender.  Post transfusion hemoglobin was 10.2,  hematocrit 30.4.  PT 18.4 with INR of 1.6.  Potassium was still a little bit  low at 3.2.  Her PT was continued.  Continued Coumadin for DVT.  Thigh high  TEDs to the right leg were ordered.  Pain control was continued as before.   On January 12, 2002, postoperative day #5, she was still complaining her knee  was hurting.  Vital signs were stable.  She was afebrile.  Wound was benign.  Calf was soft.  PT was 19.4 with an INR of 1.8.  Her OxyContin dose was  increased to 30 mg b.i.d.   On January 13, 2002, postoperative day #6, she still had some pain and  swelling of her right knee and the back of her knee with some numbness and a  lot of pressure in the back of her knee and down her calf at times. Her  vital signs were stable.  She was afebrile. The wound was benign.  Calf was  soft and nontender.  She had some slight ecchymosis around her knee wound  but no erythema, 2+ effusion.  Hemoglobin 10.2 with hematocrit 30.5 and PT  20.8 with INR of 2.0.  She did have a palpable Baker's cyst that was  nontender.  The lower extremity was intact neurovascularly.  We had decided  to discharge her home.  ACTIVITY:  Weightbearing as tolerated, ambulating with a walker and dressing  for comfort.   DIET:  Regular.  WOUND CARE:  Keep wound clean and dry.   She was allowed to shower as needed.   SPECIAL INSTRUCTIONS:  Call for increasing pain, increasing redness,  increasing drainage or discharge, severe calf pain, coughing, or shortness  of breath, fever greater than 100.5 or chills.   DISCHARGE MEDICATIONS:  1. Prevacid 30 mg on p.o. q.d.  2. Effexor 150 mg one p.o. q.d.  3. Norvasc 10 mg one p.o. q.d.  4. Hydrochlorothiazide 12.5 mg one p.o. q.d.  5. OxyContin 20 mg one p.o. q.d.  Prescription for #20 with no refills.  6. Percocet 325/5 mg one to two p.o. q.4-6h. p.r.n. breakthrough pain, #40     with no refills.  7. Phenergan 25 mg one p.o. q.6h. p.r.n. nausea, #30 with two refills.  8. Coumadin 2.5 mg three p.o. q.d.  until told otherwise by home health     nurse.   FOLLOW UP:  The patient will follow up with Dr. Renae Fickle on Wednesday, January 22, 2002.  She was instructed to call for an appointment.  She will follow  up with Dr. Jason Fila as soon as possible. She was to call for an appointment.   CONDITION ON DISCHARGE:  Stable.     Anise Salvo. Chestine Spore, P.A.-C.                    Deidre Ala, M.D.    ARC/MEDQ  D:  01/31/2002  T:  02/02/2002  Job:  [id number]

## 2010-10-14 NOTE — H&P (Signed)
[facility]  Patient:    Karen Scott, Karen Scott Visit Number: [id number] MRN: [id number]          Service Type: MED Location: 1800 1825 01 Attending Physician:  Sandi Raveling Dictated by:   Trudee Kuster, M.D. Admit Date:  04/17/2001                           History and Physical  ADMISSION DIAGNOSIS:  Intractable abdominal pain of the left lower quadrant.  HISTORY OF PRESENT ILLNESS:  This 55 year old woman with past history of abdominal problems presented to the ED with severe pain which she describes as a 9/10 which had started two days prior.  She states that initially she has a very severe pain after eating anything that lingers around like a sharp stabbing pain.  She denies any fever or chills or vomiting but she did have severe nausea.  Patient has noted frequency of stools and states that anything she eats runs right through her.  Patient thinks that her symptoms are worse when she is stressed.  PAST MEDICAL HISTORY:  Significant for per the patient ulcers, irritable bowel, hypertension, cholecystectomy.  MEDICATIONS: 1. Prevacid 30 mg q.d. 2. Effexor 75 mg q.d. 3. Norvasc 10 mg q.d. 4. HCTZ 12.5 mg q.d.  FAMILY HISTORY:  Mother with hypertension.  Dad with no medical problems.  SOCIAL HISTORY:  Patient is widowed, two children.  No tobacco, no alcohol.  REVIEW OF SYSTEMS:  As above.  No chest pain, no shortness of breath or dyspnea.  No myalgias or arthralgias.  OBJECTIVE DATA:  VITAL SIGNS:  Temperature 97.7, blood pressure 117/57, pulse 94, respiratory rate 18, O2 saturation 96%.  GENERAL:  Middle-aged woman in very little obvious distress.  HEENT:  Normocephalic.  Pupils reacted to light.  HEART:  Regular rhythm.  LUNGS:  Clear bilaterally.  ABDOMEN:  Soft.  Tenderness to palpation in the left lower quadrant.  Question mild rebound.  Positive bowel sounds.  EXTREMITIES:  No cyanosis, clubbing, or  edema.  ASSESSMENT AND PLAN:  Intractable abdominal pain.  It is difficult to say exactly what is going on with this patient.  Her exam seems to suggest some left lower quadrant tenderness.  However, patient when lying comfortably, does not seem to be in any obvious distress.  Patient was given morphine in the emergency department with minimal symptom relief.  Her laboratory data was unremarkable.  Comprehensive metabolic panel and CBC were within normal limits.  The patient is afebrile so I do not think this is acute perforation or appendicitis.  Seems that this is probably a severe case of irritable bowel.  Patient could have - but seems less likely to have - diverticular disease.  The patient may warrant GI evaluation or possibly a CT scan if symptoms do not improve. Dictated by:   Trudee Kuster, M.D. Attending Physician:  Sandi Raveling DD:  04/18/01 TD:  04/18/01 Job: [id number]

## 2010-10-14 NOTE — Procedures (Signed)
[facility]  Patient:    Karen Scott, Karen Scott                        MRN: [id number] Proc. Date: 10/07/99 Adm. Date:  [id number] Disc. Date: [id number] Attending:  Rich Brave CC:         Duncan Dull, M.D.                           Procedure Report  PROCEDURE:  Upper endoscopy with Savary dilatation of the esophagus and biopsies.  INDICATIONS:  A 55 year old female with recurrent dysphagia symptoms, approximately eight months status post previous esophageal dilatation, prior to which time it had been three years since her last dilatation.  FINDINGS:  Prominent esophageal ring dilated to 18 mm and injected with triamcinolone.  INFORMED CONSENT:  The nature, purpose, and risks of the procedure were familiar to the patient from prior procedures, and she provided written consent.  SEDATION:  Fentanyl 100 mcg and Versed 10 mg IV without arrhythmias, desaturation, or hypotension.  DESCRIPTION OF PROCEDURE:  The Olympus XQ-140 small caliber adult video endoscope was passed under direct vision without undo difficulty.  The vocal cords and larynx were not well seen, but the esophagus entered quite easily, and it had entirely normal mucosa without evidence of reflux esophagitis, Barretts esophagus, varices, infection, or neoplasia.  There was a well formed esophageal ring at the squamocolumnar junction, below which was a 2 cm hiatal hernia.  The stomach was entered.  It contained no significant residual but had multiple small (4-6 mm) sessile or semi-pedunculated polyps along the greater curve, several of which I biopsied.  A number of these occurred in clusters.  None of them looked worrisome.  No gastritis, erosions, ulcers, or large masses were seen.  Retroflexed viewing of the proximal stomach showed just a small hiatal hernia and slightly patulous, diaphragmatic hiatus.  The pylorus, duodenal bulb, and second duodenum looked  normal.  Biopsies were obtained of some of the gastric polyps as noted above, and then Savary dilatation was then performed in the standard fashion.  The spring-tipped guide wire was passed into the antrum of the stomach, and the scope was removed in an exchange fashion.  Sequential dilation was performed with Savary dilators, sizes 16 and 18 mm, after fluoroscopic confirmation of appropriate positioning of the wire.  There was smooth resistance with the passage of each dilator.  The guide wire and the larger dilator were removed, and the patient was re-endoscoped under direct vision, which showed no obvious fracture of the ring, and no evidence of undo trauma to the esophagus or stomach.  We then used a sclerotherapy needle to inject a total of 20 mg of triamcinolone at the squamocolumnar junction.  The scope was then removed from the patient, who tolerated the procedure well and without apparent complication.  IMPRESSION: 1. Esophageal ring, dilated to 18 mm and injected as described above. 2. Gastric polyps, which appear benign.  PLAN: 1. Await pathology on the polyps. 2. Clinical follow up of dysphagia symptoms. DD:  10/07/99 TD:  10/10/99 Job: [id number]

## 2010-10-14 NOTE — H&P (Signed)
[facility]  Patient:    Karen Scott, Karen Scott. Visit Number: [id number] MRN: [id number]          Service Type: Attending:  Soyla Murphy. Renne Crigler, M.D. Dictated by:   Soyla Murphy. Renne Crigler, M.D. Adm. Date:  08/28/01                           History and Physical  DATE OF BIRTH:  01/28/1956  HISTORY OF PRESENT ILLNESS:  The patient is a 55 year old widowed white resident of [facility] admitted with acute chief complaint of leg aching.  She says that she had some leg aching in both legs about a week ago.  It was intermittent.  On Sunday the right leg was aching still more.  By Monday she started having progressive swelling in her right leg, along with mild aching that worsened as the swelling got worse.  "It throbs and hurts deep inside." She had no fevers or chills.  She has been more short of breath lately.  She had had some intermittent left anterior chest pain lasting five seconds at most on Saturday.  Also some longer periods of upper back aching.  She does take Ortho-Cept, which is an oral contraceptive.  FAMILY HISTORY:  Mother has hypertension.  Father has glaucoma.  Two teenagers, ages 54 and 89, alive and well.  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  She walks some for exercise.  Does not use ethanol or tobacco.  She is a native of [address], went to [employer].  REVIEW OF SYSTEMS:  She has slight numbness in the right foot.  No weakness, seizures, blackouts, headaches, sinus pain, dizziness, ear pain, change in hearing, change in vision, runny nose, or sore throat.  She has no difficulty swallowing, nausea, vomiting.  She does have intermittent diarrhea and constipation.  Feels that she does have irritable bowel syndrome.  She has seen Dr. Virginia Rochester intermittently in the past.  Her last colonoscopy was okay, but that was a long time ago.  She is having no problems with fevers, chills, weight change.  She has had no other  chest pain than that described above.  No cough or shortness of breath.  No black stools have been noted, but she did have the one bloody stool mentioned above about two weeks ago.  No muscle or joint aches other than the deep aching in the right lower leg.  She has had no vaginal discharge or bleeding, no change in urinary habits or hematuria.  She has been able to walk fine, and her exercise tolerance ______ changed. States she is always hotter than the average person.  CURRENT MEDICATIONS: 1. Prevacid 30 mg p.o. daily. 2. Effexor XR 75 mg p.o. daily. 3. HCTZ 25 mg p.o. daily. 4. Norvasc 5 mg p.o. daily. 5. Vioxx occasionally.  ALLERGIES:  PENICILLIN - " ______ ."  NIACIN - rash and fever.  PHYSICAL EXAMINATION:  GENERAL:  Slightly overweight.  In no acute distress.  VITAL SIGNS:  BP 148/95, pulse 84, respirations 16, temperature 98.1, O2 saturation 98% on room air.  SKIN:  Within normal limits.  HEENT AND NECK:  No cervical, supraclavicular, axillary, or inguinal adenopathy.  Neck is supple, without thyromegaly.  No neck masses. Conjunctivae are normal.  PERRL.  EOMI.  Ears:  Canals are normal.  Throat and posterior pharynx are normal.  LUNGS:  Clear.  HEART:  Regular rate and rhythm.  No murmurs, rubs, or gallops.  No JVD.  She has 1+ right pretibial edema.  BREASTS:  Without masses or discharge.  ABDOMEN:  Nontender.  Bowel sounds present.  No organomegaly, no masses.  PELVIC:  On bimanual exam she has no masses or discharge.  No tenderness.  RECTAL:  Normal tone.  No masses.  Stool brown and heme-negative.  NEUROLOGIC:  Alert and oriented x3.  Cranial nerves II-XII intact.  EXTREMITIES:  Equal strength in hip flexors bilaterally, although it hurts to move the right leg.  There is no cyanosis and no clubbing.  LABORATORY DATA:  EKG:  Normal sinus rhythm, within normal limits.  IMPRESSION AND PLAN: 1. Acute deep vein thrombosis, posterior  tibial/popliteal/distal superficial    femoral on right.  The left leg has not been checked, but it has been    aching, and it possibly may have a small amount of clot also.  For    prognostic purposes, will check V/Q scan in the morning and, in the    meantime, start her on IV heparin and Coumadin.  We have discussed the    potential complications of these medications. 2. Gastroesophageal reflux disease:  On Prevacid. 3. Hypertension:  Good control. 4. Multiple allergies:  Please see above. 5. Family history of hypertension and glaucoma. 6. Oral contraceptive use:  We will discontinue this. 7. Recent rectal bleeding:  Now stool is heme-negative.  Will check further    stools for blood. Dictated by:   Soyla Murphy. Renne Crigler, M.D. Attending:  Soyla Murphy. Renne Crigler, M.D. DD:  08/29/01 TD:  08/29/01 Job: [id number]

## 2010-10-14 NOTE — Op Note (Signed)
   Karen Scott, Karen Scott                       ACCOUNT NO.:  [id number]   MEDICAL RECORD NO.:  [id number]                   PATIENT TYPE:  AMB   LOCATION:  ENDO                                 FACILITY:  [facility]   PHYSICIAN:  Georgiana Spinner, M.D.                 DATE OF BIRTH:  [birth year]   DATE OF PROCEDURE:  DATE OF DISCHARGE:  12/25/2001                                 OPERATIVE REPORT   PROCEDURE:  Colonoscopy.   INDICATIONS FOR PROCEDURE:  Questionable  colon mass or colon polyp.  Abnormal CT scan with abdominal pain.   ANESTHESIA:  Demerol 80 mg, Versed 9 mg.   DESCRIPTION OF PROCEDURE:  With the patient mildly sedated in the left  lateral decubitus position, the Olympus videoscopic colonoscope was inserted  in the rectum and passed under direct vision to the cecum, identified by the  ileocecal valve and appendiceal orifice.  From this point the colonoscope  was slowly withdrawn, taking circumferential views of the entire colonic  mucosa, stopping only in the rectum, which appeared normal  on direct and  showed hemorrhoids on retroflexed view.  The endoscope was straightened and  withdrawn.  The patient's vital signs and pulse oximetry remained stable.  The patient tolerated the procedure well.   IMPRESSION:  Internal hemorrhoids, otherwise unremarkable exam.   PLAN:  Have the patient follow up with me as an outpatient.                                               Georgiana Spinner, M.D.    GMO/MEDQ  D:  12/25/2001  T:  12/30/2001  Job:  [id number]

## 2010-10-14 NOTE — Op Note (Signed)
NAME:  KRITHI, BRAY                           ACCOUNT NO.:  [id number]   MEDICAL RECORD NO.:  [id number]                   PATIENT TYPE:  INP   LOCATION:  5009                                 FACILITY:  [facility]   PHYSICIAN:  Deidre Ala, M.D.                 DATE OF BIRTH:  [birth year]   DATE OF PROCEDURE:  01/07/2002  DATE OF DISCHARGE:                                 OPERATIVE REPORT   PREOPERATIVE DIAGNOSIS:  End-stage degenerative joint disease, right knee.   POSTOPERATIVE DIAGNOSIS:  End-stage degenerative joint disease, right knee.   PROCEDURE:  Right total knee arthroplasty using cemented DePuy components  with keeled tibia, LCS type rotating platform.   SURGEON:  Bradley Ferris, M.D.   ASSISTANTS:  Lubertha Basque. Jerl Santos, M.D., and Anise Salvo. Clark, P.A.-C.   ANESTHESIA:  General endotracheal.   CULTURES:  None.   DRAINS:  Hemovac.   ESTIMATED BLOOD LOSS:  150 cc.   BLOOD REPLACEMENT:  None.   TOURNIQUET TIME:  1 hour 11 minutes.   PATHOLOGIC FINDINGS AND HISTORY:  The patient in 08/27/97, four years ago, had  a right knee arthroscopy with DJD noted and a posterior rim meniscal tear.  Postoperatively she made satisfactory progress, although she had some  significant DJD on the medial femoral condyle and in the trochlea.  The knee  on and off bothered her but began to be symptomatic again, and this was done  on 11/15/00.  At that time we found again degenerative joint disease and did  a debridement procedure.  Ultimately the patient continued to have pain.  We  watched her over time and did a series of Hyalgan injections after she had  an episode of deep vein thrombosis.  We had seen her 06/07/01, and she was  recovering from her knee arthroscopy in June, but then the pain had come  back at that point.  She then had the DVT and she was hospitalized and  placed on Coumadin, but it was felt that she was far enough out from the DVT  that she could go off the Coumadin, go on  Lovenox, and have her surgery and  go back on Lovenox, Coumadin.  In any case, at surgery today, no untoward  features were noted regarding the DVT.  She did have significant DJD  throughout, and so we replaced her with a standard right femur, a 2.5 tibia,  standard 12.5 insert, and a 35 mm patella.  No need for lateral retinacular  release.  Range of motion was 0-125, stable to stressing in full extension  and flexion, and we used Zinacef in the cement with two batches used.   DESCRIPTION OF PROCEDURE:  With adequate anesthesia obtained using  endotracheal technique, 300 mg of Clindamycin given IV prophylaxis and  another dose at tourniquet letdown, the patient was placed in the supine  position.  The  right lower extremity was prepped from the toes to the  tourniquet in standard fashion.  After standard prepping and draping,  Esmarch exsanguination was used and the tourniquet was let up to 350 mmHg.  Incision deepened sharply with a knife and hemostasis obtained using the  Bovie electrocoagulator. Dissection was carried down through the medial  retinaculum.  The patella was then everted, fat pad excised, and medial  release carried out as well as excision of the cruciates.  I then removed  the tibial spines.  Extramedullary guide was then used to make the initial  cut with prosthesis in very slight varus to effect leg valgus.  The first  cut was made and we felt it was a little shy, so we made a second cut.  I  then sized to a standard femur, placed the anterior-posterior cutting jig in  place and with the intramedullary guide made the anterior-posterior cuts,  and fit a 12.5.  I then set it.  This is after we had set it.  We pinned it  and made the cuts.  I then fit the 12.5 lollipop.  We actually did cut a  little bit more tibia at this point and fit the 12.5 in flexion.  We then  placed a 4 degree distal femoral cutting jig in valgus and made the distal  femoral cut, and it fit the  12.5 in extension with only a slight posterior  sag.  We then placed the anterior-posterior, chamfer, and notch-cutting jig  in place and made those cuts.  We then exposed the tibia, sized it to a 2.5,  pinned it, made the keel, broach, and the central peg hole.  We then placed  on the 12.5 insert and the standard femur and articulated the knee through a  full range of motion.  The patella was then cut from a thickness of 21 down  to a 13 to replace 8.  A three-hole guide was put in place and the holes for  the patellar button made.  We then placed on the patella trial and tracked  it with good range of motion.  All trial components were then removed as we  mixed cement and checked components for sizing as they came on the field.  Jet lavage was used on the knee.  We then exposed the proximal tibia and  impacted the cement on the tibial component, impacted it, removed excess  cement, placed the 12.5 rotating platform, then cemented on the femoral  component, impacted it, removed excess cement, held it in hyperextension and  removed excess cement, then held it at about 45 degrees of flexion while the  cement cured.  We then cemented on the patellar component, impacted it,  removed excess cement, and held it with a clamp.  When the cement was hard,  the tourniquet was let down and bleeding points were cauterized.  Hemovac  drains were placed to the medial and lateral gutters and brought out the  superior lateral portal.  The wound was then closed in layers with #1 Vicryl  on the retinaculum, 0 and 2-0 Vicryl on the subcu, and skin staples.  A  bulky sterile compressive dressing was applied with Ace and knee  immobilizer.  The Hemovac hooked up to Autovac and a femoral nerve block  placed.  The patient, having tolerated the procedure well, was awakened and  taken to the recovery room in satisfactory condition for routine CPM and  analgesia.  Deidre Ala, M.D.    VEP/MEDQ  D:  01/07/2002  T:  01/09/2002  Job:  [id number]

## 2010-10-14 NOTE — Discharge Summary (Signed)
NAME:  Karen Scott, Karen Scott                           ACCOUNT NO.:  [id number]   MEDICAL RECORD NO.:  [id number]                   PATIENT TYPE:  INP   LOCATION:  0469                                 FACILITY:  [facility]   PHYSICIAN:  Ollen Gross, M.D.                 DATE OF BIRTH:  13-Mar-1956   DATE OF ADMISSION:  12/03/2002  DATE OF DISCHARGE:  12/09/2002                                 DISCHARGE SUMMARY   ADMISSION DIAGNOSES:  1. Painful right total knee.  2. History of deep vein thrombosis.  3. Hypertension.  4. Reflux disease.  5. History of peptic ulcer disease.  6. Hiatal hernia.  7. Irritable bowel syndrome.  8. Status post primary right total knee arthroplasty, August 2003.  9. Status post right peroneal nerve decompression, October 2003.   DISCHARGE DIAGNOSES:  1. Painful, stiff right total knee arthroplasty, status post right total     knee arthroplasty revision.  2. Postoperative hypoxia with positive fluid balance.  3. Postoperative mild hypokalemia.  4. Postoperative mild hyponatremia.  5. Postoperative blood loss anemia.   CONSULTATIONS:  Medical:  Dr. Juleen China.   PROCEDURE:  Date of surgery, December 03, 2002.  Right total knee arthroplasty,  revision.   SURGEON:  Ollen Gross, M.D.   ASSISTANT:  Alexzandrew L. Julien Girt, P.A.   ANESTHESIA:  General.   ESTIMATED BLOOD LOSS:  200 cc.   DRAINS:  Hemovac drain x1.   TOURNIQUET TIME:  16 minutes at 300 mmHg, then down for 10 minutes, then up  an additional 26 minutes at 300 mmHg.  Frozen sections and Gram's stains  were negative.   BRIEF HISTORY AND PHYSICAL:  Ms. Bunnell is a 55 year old female who had a  right total knee arthroplasty done about a year ago and has had  postoperative problems with stiffness and severe pain.  She has had physical  therapy but was unable to regain her motion.  She eventually had a peroneal  nerve neurolysis due to postoperative nerve problems.  She has not  complained of any  neurological symptoms except for some numbness in the  first dorsal web space.  Her main complaint is pain and stiffness.  Bone  scan showed increased activity around both components.  Infection workup was  negative.  She presented for a right total knee arthroplasty versus  resection arthroplasty.   LABORATORY DATA:  CBC on admission:  Hemoglobin 13.1, hematocrit 38, white  cell count 7.2, red cell count 4.6, differential all within normal limits.  Serial H&H's were followed.  Postoperative H&H 9.8 and 28.6, dropped down to  8.9 and 26, back up to 9 and 26.4.  PT/PTT on admission:  17.1 and 52  preoperative, with INR of 1.5.  Serial pro times and PTTs were also followed  for Coumadin and heparin.  Heparin was titrated up to a therapeutic level  and continued until the INR  was therapeutic.  PT/INRs were followed until up  to a therapeutic level.  Last noted PT/INR 18.3 and 1.8.  Chem panel on  admission:  Slightly low potassium at 3.4, elevated glucose 145, remaining  chem panel within normal limits.  Serial BMETs were followed.  Potassium  dropped a little further down to 3.4.  He was given potassium supplements,  went back up to 4.4.  Sodium started at 137, went down as low as 132, back  up to 137.  Glucose 145, got as high as 151, back down to 125.  BUN from 10  down to 5, back up to 9.  Calcium from 9.8 down to 8.3, back up to 9.1.  Cardiac enzymes on December 04, 2002:  CK normal at 190, CK-MB normal at 1.9,  normal relative index of 1, troponin I normal at 0.03.  Preoperative UA:  Trace hemoglobin, small leukocyte esterase, few epithelial cells, 3-6 white,  0-2 red cells, few bacteria.  Blood group type A positive.  Blood cultures  x2:  No growth after five days.  Gram's stain x2 taken at the time of  surgery, both from the right knee.  Both showed rare and few wbc's, no  organisms seen.  Wound cultures x2, no growth.  Anaerobic cultures x2, no  anaerobes isolated.   EKG, dated December 04, 2002:  Sinus tachycardia, low voltage QRS, abnormal EKG.  Decreased anterior R-wave progression confirmed by Dr. Nona Dell.  Right knee films taken on December 03, 2002:  Right knee arthroplasty without  fracture or apparent complication.  Portable chest taken on December 04, 2002:  Mild cardiomegaly, possibly a mild pulmonary vascular congestion, no overt  congestive heart failure.  CT of the chest taken on December 04, 2002:  Negative  for PE; ________ distal and pulmonary artery are somewhat less than optimal  which makes it somewhat difficult to evaluate for distal peripheral  pulmonary embolism.  No central or medium or large sized pulmonary branch  detected.  Bilateral lobar pneumonia mainly involves the superior segment of  left lower lobe.  Follow-up chest x-ray, December 05, 2002:  Poor inspiration,  pulmonary edema thought to be present.   HOSPITAL COURSE:  The patient was admitted to [facility] on December 03, 2002, taken to OR, and underwent the above stated procedure without  complication.  The patient tolerated the procedure well and later sent to  the recovery room, and then to the orthopedic floor for continued  postoperative care.  Hemovac drain placed on the surgery was pulled on  postoperative day #1.  The patient was placed on 48 hours of vancomycin  protocol.  She was started on Coumadin for DVT prophylaxis, also started on  Lovenox the morning following surgery due to her significant history in the  past, to keep the patient covered until she was therapeutic.  She has had  history of DVTs in the past.  Home medicines were restarted.  She was placed  on PCA and p.o. analgesic for pain control following surgery, plus weight  bearing as tolerated.  She had a pain infusion pump also placed at the time  of surgery.  The pain pump tube was included and had to be pulled on  postoperative day #1.  Hemovac was pulled also on postoperative day #1.  The patient had a fair amount of  pain on that evening.  We adjusted  medication for better control.  Labs were followed very closely.  Hemoglobin  on postoperative day #1 was 9.8.  Unfortunately later that afternoon around  5 o'clock the patient had an increase ni her pulse rate and a drop in her O2  saturations and became somewhat hypoxic, somewhat agitated.  She denied any  chest pain or acute shortness of breath, but due to the drop in her O2  saturations and her significant history, medical consult was called.   Labs including cardiac enzymes were ordered.  Chest x-ray was ordered which  did show some mild pulmonary vascular congestion but no overt heart failure.  Spiral CT scan was also ordered which was read as above.  Due to the  significant history and possible pulmonary embolism, the patient was placed  on heparin, even though the CT scan did not show any obvious PE, it was felt  it would be appropriate for the patient to be covered with heparin until at  which point her Coumadin was therapeutic.  She was started on heparin dosage  per Dr. Juleen China.   On the following morning, postoperative day #2, she was doing a little bit  better and had been seen in rounds by Dr. Lequita Halt.  She was noted to have  positive fluid balance and underwent some mild diuresis.  She was also noted  to have at the same time some low sodium and low potassium.  She was placed  on potassium supplements for the hypokalemia and we are going to monitor the  sodium levels with the positive fluid balance and diuresis and it was noted  that chest x-ray showed some mild congestion.  She was placed on Coumadin  protocol and continued the heparin, therefore, the Lovenox was not used.   By day #3 she was still using PCA and we were encouraging her to wean over  p.o. meds.  PT and OT were consulted to assist with gait training,  ambulation, and ADLs.  She had a slow start due to her hypoxic condition,  although once she was over this by day #2, she  was up ambulating  approximately 10 feet and then 50 feet later that evening, by day #3, up to  150 feet, even by day #5, over 200 feet.  She started progressing well with  her therapy as documented, much less discomfort by day #4 and day #5.  Dressing change was initiated on postoperative day #2.  Incision was healing  well.   As she continued to improve, discharge planning was arranged for post  hospital care.  She had been seen by rehab services and Dr. Ellwood Dense.  Due to her progress with her therapy it was felt that she would likely not  require inpatient stay, therefore, arrangements were made for the patient to  go home.  On December 09, 2002 the patient was doing much better.  INRs were  titrating up to a therapeutic range.  Continue her Coumadin and will  discontinue the heparin and it was decided she would be discharged home.   DISCHARGE PLAN:  Patient discharged home on December 09, 2002.   DISCHARGE DIAGNOSES:  Please see above.  DISCHARGE MEDICATIONS:  1. Coumadin.  2. Percocet.  3. Robaxin.   DIET:  Low sodium diet.   ACTIVITY:  She is weight bearing as tolerated.  Home health PT, home health  nursing, PT/OT healthcare.  Total knee protocol.   SPECIAL INSTRUCTIONS:  Coumadin for three total weeks and then resume her  preoperative dosing which will be monitored through her medical physician.  FOLLOW UP:  Friday after discharge.    DISPOSITION:  Home.   CONDITION ON DISCHARGE:  Improved.     Alexzandrew L. Julien Girt, P.A.              Ollen Gross, M.D.    ALP/MEDQ  D:  01/07/2003  T:  01/07/2003  Job:  [id number]   cc:   Brooke Bonito, M.D.  [address]  [place]  [place] [address]  Fax: [phone]   Soyla Murphy. Renne Crigler, M.D.  [address]  [place]  [place] [address]  Fax: [phone]

## 2010-10-14 NOTE — Op Note (Signed)
NAME:  LESHIA, KOPE                           ACCOUNT NO.:  [id number]   MEDICAL RECORD NO.:  [id number]                   PATIENT TYPE:  INP   LOCATION:  0004                                 FACILITY:  [facility]   PHYSICIAN:  Ollen Gross, M.D.                 DATE OF BIRTH:  1956-03-22   DATE OF PROCEDURE:  12/03/2002  DATE OF DISCHARGE:                                 OPERATIVE REPORT   PREOPERATIVE DIAGNOSIS:  Painful, stiff right total knee arthroplasty.   POSTOPERATIVE DIAGNOSIS:  Painful, stiff right total knee arthroplasty.   OPERATION/PROCEDURE:  Right total knee arthroplasty revision.   SURGEON:  Ollen Gross, M.D.   ASSISTANT:  Alexzandrew L. Julien Girt, P.A.   ANESTHESIA:  General.   ESTIMATED BLOOD LOSS:  Less than 200 mL.   DRAINS:  Hemovac x1.   TOURNIQUET TIME:  16 minutes at 300 mmHg, then down for 10 minutes, the up  for an additional 26 minutes at 300 mmHg.   COMPLICATIONS:  None.   CONDITION:  Stable to recovery.   PATHOLOGY:  All frozen sections and Gram stains negative.   CLINICAL NOTE:  Karen Scott is a 55 year old female who had a right total  knee arthroplasty done appropriately a year ago and has had postoperative  problems with stiffness and severe pain.  She had physical therapy but was  unable to regain her motion.  She eventually had a peroneal nerve  neurolysis.  She is not complaining of any neurologic symptoms now except  for some numbness in the first dorsal web space.  Her main complaint is pain  and stiffness.  Bone scan showed increased activity around both components.  Infection workup preoperatively is negative.  She presents now for right  total knee arthroplasty revision versus resection arthroplasty.   DESCRIPTION OF PROCEDURE:  After successful administration of general  anesthesia, a tourniquet was placed on the right thigh and right lower  extremity prepped and draped in the usual sterile fashion.  Her preoperative  range of  motion is approximately 10-65 degrees.  The extremities were  wrapped in Esmarch, knee flexed and tourniquet was inflated to 300 mmHg.  Previous medial parapatella incisions were utilized and cut with a 10-blade  through the subcutaneous tissue to the level of the extensor mechanism.  A  fresh blade was used to make a medial parapatellar laparotomy.  Upon  entering the joint, there was a very small amount of fluid and this is sent  for stat Gram stain, CNS which ended up coming back negative for organisms  or white cells.  There was extensive scarring in the suprapatellar area and  I excised all this scar.  There was some reddish synovium that I found and I  also sent that for culture which was negative.  Once I recreated this  suprapatella pouch, we were able to get her flexed about 100 degrees.  We  then elevated the soft tissue of the proximal medial tibia into the joint  line with a knife and the semimembranous was __________  curved osteotome.  I elevated the tissue on the proximal lateral tibia with attention being  paid to avoiding the patellar tendon on tibial tubercle.  I was then able to  evert the patella and flex over 90 degrees.  We subluxed the tibia forward  and removed the tibial polyethylene.  On inspecting the femoral component,  it appeared that the trochlea flange was elevated quite a bit off the  anterior cortex of the femur elevated proximally 2-3 mm.  We subsequently  easily disrupted the interface between the cementing bone and removed the  femoral component with ease and no bone was lost at all.  The kidney was  again subluxed forward and the tibia component was more well fixed.  We were  able to disrupt the interface between was metal and cement and then removed  the tibial component.  There was again minimal bone loss with removing the  tibial component.  We sent frozen section of soft tissue behind the femur  and behind and underneath tibia.  These both came back  negative for any  acute inflammation.  It was thus decided to go ahead and pursue the revision  surgery.   I reamed to femoral canal up the patient 16 mm and the tibial canal to 14 mm  at a depth of 125 on the femur and 75 on the tibia.  We were going to use 13  mm x 60 cemented stem and than a 16 x 125 Press-Fit stem on the femur.  We  placed the trial stem down into the tibial canal and then placed a 3-degree  cutting block to skim the surface of the tibia and resect down to a level  surface.  Once the resection was made, I placed the trial 2.5 tibial tray  with the 13 x 60 stem extension and it sat down flat on the tibia.  We then  removed the trial.   We placed the trial stem extension 16 mm for the femur and then placed the  distal femoral cutting block in five degrees of valgus.  We set it for 0 mm  cut and they skimmed the surface on the medial side but lateral no bone  could be resected so it went up to 4 mm on the lateral side and subsequently  used a 4 mm distal augment.  We then placed the spacer blocks and with the  knee in 90 degrees of flexion in order to gain a rectangular flexion space,  adjusted the AP cutting block on the femur.  It ended up corresponding with  the epicondylar axis full rotation.  I placed it in a +2 position.  I  suspect we will be raising the stem and lowering the anterior cutting block  so the anterior sleeve would fit directly on the anterior cortex of the  femur.  We trimmed the block and made the anterior posterior cuts.  We then  placed the revision block to make the intercondylar chamfer cuts.  When  these were completed, we placed a trial 2.5 tibia with a 13 x 60 stem  extension and then the trial 2.5 posterior stabilized the femur with the 16  mm x 125 stem extension in a four degree distal lateral augment.  We placed  a 20 mm stabilized plus trial insert after trialing with the  15 stem 2.5 and having some varus/valgus instability.  With the  26 full extension, excellent  varus and valgus balance throughout the full range of motion.  The tibia  rotation is marked and it does correspond with the second metatarsal axis.  We then finished preparing the proximal tibia with the keel punch.  She  already had a well fixed patella in there with no evidence of any wear.  Thus we checked the tracking with the trial and it tracked well. There was a  tiny amount of __________  so did a partial release and it tracked normally.  We then removed the trial insert and deflated the tourniquet, stopping the  line of bleeding with electrocautery.  Tourniquet was down for a total of 10  minutes.  In the interim, I was preparing the components on the back table,  once again utilizing the size 2.5 modular tibial tray with a 13 x 60  cemented stem extension and on the femoral side we used the size 2.5  posterior stabilized femoral component with a five degree offset bulb in the  +2 position and the 16 x 125 stem extension as well as a 4 mm distal lateral  augment.  Once the tourniquet was down for a total of 10 minutes, then we  rewrapped the extremity in Esmarch and reinflated to 300 mmHg.  We trialed  for the size of the cement plug on the tibia and placed a size 4.  We then  prepared the bone with pulsatile lavage and mixed the cement.  I did not  place any antibiotics into the cement because she has an allergy to the  mycins as well as penicillin.  We injected the cement into the canal and  then cemented the tibia component in place and subsequently the femoral  component was cemented on the distal aspect of the femur.  The stem was the  Press-Fit stem.  The stem on the tibial side is a cemented stem.  We placed  20 mm trial insert, have the knee in full extension and removed all excess  cement.  Once the cement fully hardened, then we put the 20 mm stabilized  plus tibial insert into the tibial tray.  We reduced the knee and once again  there was  excellent stability throughout full range of motion.  The wound  was then copiously irrigated with antibiotic solution and extensor mechanism  was closed over a Hemovac drain with interrupted ##1 PDS.  Tourniquet was  released after a total time of 26 minutes.  Subcu was closed with  interrupted 2-0 Vicryl and  skin with staples.  We then placed the catheter with a Marcaine pain pump  for postoperative use.  We hooked the catheter up to the pump and placed a  bulky sterile dressing, awakened her and transported her to recovery in  stable condition.                                               Ollen Gross, M.D.    FA/MEDQ  D:  12/03/2002  T:  12/03/2002  Job:  [id number]

## 2010-10-14 NOTE — Procedures (Signed)
[facility]  Patient:    Karen Scott, Karen Scott                        MRN: [id number] Proc. Date: 07/31/00 Adm. Date:  [id number] Disc. Date: [id number] Attending:  Jenel Lucks                           Procedure Report  PROCEDURE:  Upper endoscopy.  ENDOSCOPIST:  Sabino Gasser, M.D.  INDICATIONS:  Abdominal pain.  ANESTHESIA:  Demerol 70 mg, Versed 6 mg IV.  DESCRIPTION OF PROCEDURE:  With the patient mildly sedated in the left lateral decubitus position, the Olympus videoscopic endoscope was inserted into the mouth and passed under direct vision through the esophagus which appeared normal.  There was no evidence of Barretts.  The endoscope was advanced into the stomach.  The fundus, body, antrum, duodenal bulb, and second portion were visualized and photographs taken.  The endoscope was slowly withdrawn taking circumferential views of the entire duodenal mucosa and the endoscope then pulled back into the stomach and placed in retroflexion to view the stomach from below which appeared normal.  The endoscope was then straightened and withdrawn taking circumferential views of the entire gastric mucosa stopping to photograph and biopsy gastric polyps and a thickened antral fold along the way.  This was done until the endoscope was withdrawn taking circumferential views of the remaining gastric and esophageal mucosa.  The patients vital signs and pulse oximeter remained stable.  The patient tolerated the procedure well without apparent complications.  FINDINGS:  Gastric polyps, thickened antral fold.  Essentially unremarkable examination.  PLAN: 1. Await biopsy results. 2. The patient will call me for results and follow up with me as an    outpatient. 3. The patient is to be discharged with outpatient follow-up. 4. Will advance diet to regular at this time.  The patient has    experienced hunger.    The distal esophagus was approached and appeared normal as  photographed. The endoscope was advanced into the stomach.  The antrum was approached and appeared to be somewhat erythematous consistent with possible gastritis, and this was biopsied and photographed.  The endoscope was advanced to the duodenal bulb, and there was a question of a small ulcer seen.  This was photographed.  The second portion of the duodenum appeared normal from this point.  The endoscope was slowly withdrawn, taking circumferential views of the entire duodenal mucosa through the endoscope and pulled in the stomach. Once the CLOtest biopsy was taken, the endoscope was passed in retroflexion to view the stomach from the below.  This appeared normal and was photographed. The endoscope was straightened and pulled back from distal to proximal stomach taking circumferential views of the entire gastric mucosa until the endoscope had been pulled back into the esophagus.  It was slowly withdrawn taking circumferential views of the entire esophageal mucosa which otherwise appeared normal.  The patients vital signs and pulse oximeter remained stable.  The patient tolerated the procedure well with no apparent complications.  FINDINGS:  Question of a tiny ulcer in the duodenum with possible Helicobacter pylori gastritis.  PLAN: 1. Await biopsy report  the patient will call me on Friday for    results. 2. Proceed with colonoscopy. DD:  07/31/00 TD:  08/01/00 Job: [id number]

## 2010-10-14 NOTE — Op Note (Signed)
NAME:  Karen Scott, Karen Scott                 ACCOUNT NO.:  [id number]   MEDICAL RECORD NO.:  [id number]          PATIENT TYPE:  AMB   LOCATION:  [facility]                         FACILITY:  [facility]   PHYSICIAN:  Ollen Gross, M.D.    DATE OF BIRTH:  1956/02/19   DATE OF PROCEDURE:  11/23/2005  DATE OF DISCHARGE:  11/23/2005                                 OPERATIVE REPORT   PREOPERATIVE DIAGNOSIS:  Left knee medial meniscus tear.   POSTOPERATIVE DIAGNOSIS:  Left knee medial meniscus tear.   PROCEDURE:  Left knee arthroscopy with meniscal debridement.   SURGEON:  Ollen Gross, M.D.   ASSISTANT:  None.   ANESTHESIA:  General.   ESTIMATED BLOOD LOSS:  Minimal.   DRAINS:  None.   COMPLICATIONS:  None.   CONDITION:  Stable to recovery.   BRIEF CLINICAL NOTE:  Karen Scott is a 55 year old female who has a several-month  history of left knee pain and mechanical symptoms.  Examination and history  suggest meniscal tear confirmed by MRI.  She presented now for arthroscopy  and debridement.   PROCEDURE IN DETAIL:  After the successful administration of general  anesthetic, a tourniquet was placed high on the left thigh and the left  lower extremity prepped and draped in the usual sterile fashion.  Standard  superomedial and inferolateral incisions made, inflow cannula passed  superomedial and counter passed inferolateral.  Arthroscopic visualization  proceeded.  Undersurface of the patella and trochlea looked normal.  Medial  and lateral gutters were visualized and there were no loose bodies.  Flexion  valgus forces applied to the knee in the anterior compartments.  She has a  bad tear, body and posterior horn of the medial meniscus.  She has some  chondromalacia in the medial condyle, but no focal full-thickness defects.  A spinal needle was used to localize the inferomedial portal.  A small  incision made, dilator placed and then the meniscus was debrided back to a  stable base with baskets and a  4.2 shaver.  The edges were sealed with the  ArthroCare.  Condylar surfaces were inspected and there were no focal  defects.  There was no area that needed to be shaved.  The intercondylar  notch was visualized.  Anterior cruciate ligament was normal.  The lateral  compartments was entered and it was normal.  The arthroscopic equipment was  removed from the inferior portals, which were closed with interrupted 4-0  nylon.  Then 20 mL of 0.25% Marcaine with epinephrine injected through the  inflow cannula and that was removed and that portal closed with nylon.  Bulky sterile dressing was applied and she was awakened and transported to  recovery in stable condition.      Ollen Gross, M.D.  Electronically Signed     FA/MEDQ  D:  11/25/2005  T:  11/25/2005  Job:  [id number]

## 2010-10-14 NOTE — Consult Note (Signed)
[facility]  Patient:    Karen Scott, Karen Scott                        MRN: [id number] Adm. Date:  [id number] Attending:  Jenel Lucks                          Consultation Report  REASON FOR CONSULTATION:  The patient is a 55 year old lady who was admitted to the hospital because of abdominal pain.  Patient has been having abdominal pain she states in the left upper quadrant that feels like a knife stabbing her that eventually radiates down to the left lower quadrant of the abdomen. The pain is intermittent, but it has been every day now for three weeks and brought on with eating.  It may start with the first meal of the day and every meal thereafter; subsequently, she has also noticed cramping followed by diarrhea, with some relief of the cramping pain with passage of a bowel movement.  The diarrhea will occur approximately two to three times after every bowel movement; despite this, she has not lost weight.  There has been no fever, chills or night sweats.  She has been nauseated for about a week. Her weight, if anything, has gone up during this period of time and she has noticed some blood per rectum which has been bright red and she has attributed it to hemorrhoids.  There has been no vomiting until today, when she vomited some clear material.  The patient states that she has been evaluated in the past for diarrheal-type problems and has been told that she has irritable bowel syndrome by Dr. Katy Fitch Buccini, her previous gastroenterologist.  She does not smoke, does not drink and prior to the onset of this problem, her bowel movement pattern was normally once a day without abdominal pain.  MEDICATIONS:  Patient has been on Effexor 75 mg once a day, Norvasc, hydrochlorothiazide for hypertension and Prevacid for reflux symptomatology.  PAST SURGICAL HISTORY:  She has had multiple surgeries on her right wrist for carpal tunnel release and she has had a  cholecystectomy laparoscopically in the past.  ALLERGIES:  She has allergies to PENICILLIN and MYCINS.  FAMILY HISTORY:  Family history is negative; however, her husband, who was 4, died on 23-Feb-2024from a renal cell carcinoma that had been discovered at [facility] just before Thanksgiving of last year; he had been treated by Dr. Pierce Crane of oncology at [facility].  Patient has now come to be under the care of Dr. Jenel Lucks.  There is no family history of colon polyps or colon cancers.  PHYSICAL EXAMINATION  VITAL SIGNS:  On examination here today, the vital signs were temperature 97.9, pulse 80, respiratory rate 16, blood pressure 114/58.  GENERAL:  She is a well-developed, well-nourished, somewhat overweight female lying in bed who appears in no distress.  HEENT:  Unremarkable.  NECK:  Thyroid not enlarged.  No bruits in the neck.  LUNGS:  Clear.  HEART:  Regular rhythm without murmur, gallop or rub.  ABDOMEN:  Soft.  Bowel sounds are active and she has tenderness to minimal palpation on the left side of the abdomen.  IMPRESSION:  Given the fact that she has had a pain immediately postprandial and she has had a workup to include complete blood count, liver function tests and CT scan, I think that we can look at  her stomach to rule out ulcer disease.  She has certainly been under a lot of stress and that certainly could provoke abdominal pain related to ulcers, given the fact that she is nauseated and there has been some vomiting, that might be helpful.  We suggested endoscopy to her, explained the procedure, risks, benefits and rationale -- she has had it in the past -- but I think that most of her symptoms were probably related to irritable bowel syndrome relapse due to the excessive stress that she has been under.  She is somewhat tearful at this point and certainly this is understandable, given the recent death of her husband.  At this point, if we do  not find an ulcer, I would tend to treat all of her symptoms expectantly for irritable bowel and treat anxiety with anxiolytics and care for her psychological needs and emotional needs in the immediate future. DD:  07/30/00 TD:  07/31/00 Job: [id number]

## 2010-10-14 NOTE — Discharge Summary (Signed)
[facility]  Patient:    Karen Scott, Karen Scott Visit Number: [id number] MRN: [id number]          Service Type: MED Attending Physician:  Londell Moh Dictated by:   Soyla Murphy. Renne Crigler, M.D. Admit Date:  10/12/2001 Discharge Date: 10/15/2001   CC:         Jearld Adjutant, M.D.   Discharge Summary  LABORATORY DATA:  Sedimentation rate 16.  Initial INR 1.5.  By the 20th, it was only up to 1.4.  Initial potassium 3.0, repeat on the 19th was 3.3. Otherwise, BMET normal.  EKG normal sinus rhythm, poor R-wave progression.  Radiology report prominent degenerative changes noted of the right knee without acute abnormality and CT chest and extremities no evidence of pulmonary embolism or DVT, minimal bibasilar atelectasis.  HOSPITAL COURSE:  Please see admission history for details of [name] presentation.  Briefly, she had been upon her feet quite a bit during her wedding week.  Immediately after the ceremony, she came to the emergency room with severe leg swelling and pain in the same leg in which she had a recently had a DVT, although there was no evidence of DVT on exam.  Her INR was subtherapeutic.  She was kept in the hospital and treated for inadequate anticoagulation with IV heparin given the severity of her symptoms.  The orthopedic team also saw her during the hospital stay regarding her severe osteoarthritis of the knee.  By the time of discharge, she was felt up to therapeutic and therefore was discharged on Lovenox.  IMPRESSION: 1. Postphlebitic syndrome. 2. Inadequate anticoagulation. 3. Severe osteoarthrosis of the knee. 4. Hypertension. 5. Gastroesophageal reflux disease.  DISPOSITION:  She is discharged home on the following medications.  DISCHARGE MEDICATIONS: 1. Coumadin 15 mg p.o. on the 20th. 2. Lovenox 110 mg subcutaneously daily at 11 a.m. until the INR is    therapeutic. 3. Effexor XR 75 mg p.o. q.d. 4. Norvasc 10 mg p.o.  q.d. 5. Prevacid 30 mg p.o. q.d. 6. HCTZ 25 mg p.o. q.d. 7. Ambien 10 mg 1/2 to 1 at bed time as needed for sleep. 8. Ultracet 2 p.o. t.i.d. p.r.n. for severe pain not handled by Tylenol.  DISCHARGE INSTRUCTIONS:  She is to avoid excessive standing and will stay on her no added salt, Coumadin diet.  She will follow up in the [facility]madin Clinic at our office the date after discharge and see me in the office in 7 to 10 days. Dictated by:   Soyla Murphy. Renne Crigler, M.D. Attending Physician:  Londell Moh DD:  10/21/01 TD:  10/21/01 Job: [phone] [id number]

## 2010-10-14 NOTE — Op Note (Signed)
NAME:  Karen Scott, Karen Scott NO.:  [id number]   MEDICAL RECORD NO.:  [id number]          PATIENT TYPE:  AMB   LOCATION:  ENDO                         FACILITY:  [facility]   PHYSICIAN:  Georgiana Spinner, M.D.    DATE OF BIRTH:  Jan 09, 1956   DATE OF PROCEDURE:  DATE OF DISCHARGE:                                 OPERATIVE REPORT   PROCEDURES:  Upper endoscopy with dilation and biopsy.   INDICATIONS:  Dysphagia.   ANESTHESIA:  Fentanyl 75 mcg, Versed 5 mg.   PROCEDURE:  With the patient mildly sedated in the left lateral decubitus  position, the Olympus videoscopic endoscope was inserted in the mouth and  passed under direct vision through the esophagus which appeared normal until  we reached distal esophagus and a stricture was noted photographed.  The  endoscope was advanced through this area into the stomach; fundus, body,  antrum, duodenal bulb, second portion duodenum were visualized. From this  point the endoscope was slowly withdrawn taking circumferential views of  duodenal mucosa until the endoscope then pulled back into the stomach placed  in retroflexion to view the stomach from below.  The endoscope was then  straightened and pulled back from distal to proximal stomach, taking  circumferential views of gastric mucosa stopping to photograph the fundus  and multiple polyps that were seen there.  Biopsies were taken.  The  endoscope was then advanced to the distal stomach and a guidewire was  passed.  The endoscope was withdrawn.  Subsequently a 17 Savary dilator was  passed rather easily and with that the guidewire was removed.  The endoscope  was reinserted to this level and withdrawn taking circumferential views of  remaining gastric and esophageal mucosa.  The patient's vital signs, pulse  oximeter remained stable.  The patient tolerated procedure well without  apparent complication.   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  The patient will call  me for results and follow-up with me as an outpatient.           ______________________________  Georgiana Spinner, M.D.     GMO/MEDQ  D:  09/06/2005  T:  09/06/2005  Job:  [id number]

## 2010-10-14 NOTE — Discharge Summary (Signed)
[facility]  Patient:    Karen Scott, Karen Scott Visit Number: [id number] MRN: [id number]          Service Type: MED Location: 3W [phone] Attending Physician:  Londell Moh Dictated by:   Soyla Murphy. Renne Crigler, M.D. Admit Date:  08/28/2001 Discharge Date: 09/07/2001   CC:         Sung Amabile. Roslyn Smiling, M.D.  Charlaine Dalton. Wert, M.D. [facility]   Discharge Summary  VENOUS EVALUATION:  Extensive DVT coursing from the posterior tibial vein through the popliteal and into the distal superficial femoral vein. Thrombosis of problem anterior tibial vein noted. Profunda and common femoral veins appear patent. There is no evidence of superficial thrombosis or Bakers cyst.  ECHOCARDIOGRAM:  Overall LV systolic function normal. LVES estimated 55-65%. No regional wall motion abnormalities. No obvious source of embolus.  EKG:  Normal sinus rhythm on August 29, 2001.  RADIOLOGY REPORTS:  Chest x-ray negative for active disease, with borderline heart size. CT chest and extremities some interval on April 8, some decrease in size of previously identified pulmonary emboli. No new PE or other apparent complications are evident. Persistent right lower extremity DVT. CT of the head partial polypoid opacification of right axillary sinus, no evidence of intracranial hemorrhage. CT chest bilateral pulmonary emboli greatest in left lower lobe.  LABORATORY REPORTS:  Electrolytes were normal, glucose 114, albumin 3.3, otherwise within normal limits. CK and CK-MBs were low. Initial white count 9.2, hemoglobin 12.2.  HOSPITAL COURSE:  Please see admission history and physical for details of Susans presentation. Briefly, she had one week of leg aching with progressing swelling. She came to the emergency room where she was found to have significant DVT and she was admitted for IV heparin therapy as well as Coumadin. Her course was complicated by a headache for which she received a head CT. This  showed no evidence of intracranial bleed. By April 8, she was having continued intermittent chest pain in various locations; therefore, pulmonary consultation was obtained with Dr. Sherene Sires. He felt that she needed a full 10 days on heparin and a follow-up CT chest which was performed with results as above. He recommended avoiding all forms of hormone replacement in the future if possible.  She had extensive menstrual bleeding during the latter part of her stay. This was treated with Provera 10 mg p.o. daily for 10 days with cessation of the bleeding in several days afterwards.  Dr. Sherene Sires felt that the further chest pain was related to smaller clots migrating distally over time and that no further treatment was needed. By September 05, 2000, she felt well, had transient slight chest pain, no leg edema and was discharged to home.  DISPOSITION:  She was discharged home on the following medications:  1. Coumadin 7.5 mg p.o. daily.  2. Norvasc 5 mg p.o. daily.  3. Prevacid 30 mg p.o. daily.  4. HCTZ 25 mg p.o. daily.  5. Effexor XR 75 mg p.o. daily.  6. Provera 10 mg p.o. daily for the remainder of the 10 days.  She is to see Dr. Roslyn Smiling the following Monday and will discuss with her at that time treatment to prevent further menstrual periods while on Coumadin. She will see me in follow-up in three weeks. Instructions were given and a diet appropriate for using Coumadin. No dangerous physical activities. Dictated by:   Soyla Murphy. Renne Crigler, M.D. Attending Physician:  Londell Moh DD:  09/21/01 TD:  09/23/01 Job: [id number]

## 2010-10-14 NOTE — H&P (Signed)
NAME:  Karen Scott, Karen Scott                           ACCOUNT NO.:  [id number]   MEDICAL RECORD NO.:  [id number]                   PATIENT TYPE:  INP   LOCATION:  NA                                   FACILITY:  [facility]   PHYSICIAN:  Ollen Gross, M.D.                 DATE OF BIRTH:  1955/06/16   DATE OF ADMISSION:  12/03/2002  DATE OF DISCHARGE:                                HISTORY & PHYSICAL   CHIEF COMPLAINT:  Right knee pain.   HISTORY OF PRESENT ILLNESS:  The patient is a 55 year old female who has  been seen for a second opinion by Dr. Ollen Gross for right knee pain and  stiffness.  She has a significant history dating back to 2003 when she  underwent a right total knee replacement arthroplasty by Dr. Renae Fickle.  Postoperatively, she did not have any problems with wound drainage, fevers,  or chills.  The patient states, though, she did develop a lot of redness and  swelling in the knee initially.  Apparently she had some type of nerve-  related problem in the lower leg and underwent a peroneal nerve release in  October of 2003.  This was done through a separate lateral arthrotomy and  also had removal of bone spurs from the lateral side.  She said she had  gotten progressively worse since her surgery.  While she just had pain prior  to the total knee, she now was developing pain, significant stiffness, and a  tremendous amount of dysfunction with the right leg.  She had a difficult  time walking.  She had pain both with activity and at rest.  She was quite  discouraged and was seen by Dr. Lequita Halt in consultation for a second  opinion.  She was found to have a significant painful, stiff, total knee  nine months postoperative with progressive worsening function.  There were  no outward signs of infection.  There was some concern of possible  infection, possible loosening, or even RSD, or a component of all three.  X-  rays in the office showed that the alignment appeared to be adequate.   On  the lateral view, there was some slight overhang of the phalange posterior  and inferior.  The tubular component looked to be in good position.  There  appeared to be some hederotropic bone or retained cement posterior to the  polyethylene.  She did have a small focus of hederotropic bone in the  suprapatellar area.  She did have a significant problem and increasing  dysfunction and pain.  After a long discussion between Dr. Lequita Halt and the  patient, it was felt the patient could benefit from undergoing possible  revision of the right total knee versus resection if there was found to have  any infection with implantation of the antibiotic impregnated cement spacer  for multiple stage procedures.  Risks and benefits of  these procedures were  discussed with the patient at length, and she elected to proceed with  surgery.   ALLERGIES:  1. MYCINS.  2. PENICILLIN cause a bad reaction in childhood years.   CURRENT MEDICATIONS:  1. Effexor.  2. Hydrochlorothiazide.  3. Metamucil.  4. Norvasc.  5. Coumadin, which was stopped preoperatively.   PAST MEDICAL HISTORY:  Deep vein thrombosis, hypertension, reflex disease,  history of peptic ulcer disease, hiatal hernia, irritable bowel syndrome.   PAST SURGICAL HISTORY:  Right total knee replacement arthroplasty in August  of 2003.  Had a peroneal nerve release in October of 2003.   SOCIAL HISTORY:  Married, two children.  Denies use of tobacco produces or  alcohol products.   FAMILY HISTORY:  Father, age 55, with a history of glaucoma.  Mother aged 67  with a history of hypertension.   REVIEW OF SYSTEMS:  GENERAL:   No fevers, chills, night sweats.  NEUROLOGIC:  No seizures seen in paralysis.  RESPIRATORY:  No shortness of breath,  productive  could, or hemoptysis.  CARDIOVASCULAR:  Angina, orthopnea.  GI:  She does have some intermittent diarrhea, which she attributes to her  irritable bowel syndrome.  No nausea, vomiting,  constipation.  No bloody  mucous in the stool.  GU:  No dysuria, hematuria, or discharge.  MUSCULOSKELETAL:  Pertinent to  that of the knee.   FAMILY HISTORY:  As per present illness.   PHYSICAL EXAMINATION:  VITAL SIGNS:  Pulse 74, respirations 16, blood  pressure 128/92.  GENERAL:  The patient is a 55 year old white female, well-nourished, well-  developed.  The patient in no acute distress.  She is alert, oriented, and  cooperative.  Appears to be a good historian.  HEENT:  Normocephalic and atraumatic.  Pupils equal, round and reactive.  Extraocular movements are intact.  Oropharynx clear.  NECK:  Supple.  CHEST:  Clear to auscultation, anterior and posterior chest.  No rhonchi,  rales, or wheezing.  HEART:  Regular rate and rhythm.  No murmur.  S1 and S2 noted.  ABDOMEN:  Slightly round abdomen, soft, nontender.  Bowel sounds present.  RECTAL/BREASTS/GENITALIA:  Not done.  Not pertinent to present illness.  EXTREMITIES:  Significant in that of the right lower extremity.  There  appear to be three different incisions.  She has one medial incision, one  lateral incision.  There is also a posterolateral incision which was felt to  be the peroneal nerve decompression incision.  She does have some  significant soft tissue thickening.  Range of motion is very limited with 0  to 60 degrees.  She does have some weakness with dorsiflexion and also EHL.  Sensation is slightly diminished over the first dorsal web space.   IMPRESSION:  1. Painful right total knee.  2. History of deep vein thrombosis.  3. Hypertension.  4. Reflux disease.  5. History of peptic ulcer disease.  6. Hiatal hernia.  7. Irritable bowel syndrome.  8. Status post primary right total knee replacement arthroplasty, August of     2003.  9. Status post right peroneal nerve decompression in October of 2003.  PLAN:  The patient admitted to [facility].  Will undergone  revision of the right total knee  replacement arthroplasty versus resection  arthroplasty.  Surgery will be performed by Dr. Ollen Gross.  The  patient's medical decision is Dr. Renne Crigler.  Dr. Renne Crigler will be notified of the  room number and to be consulted if needed  for any medical assistance with  this patient throughout the hospital course.     Alexzandrew L. Julien Girt, P.A.              Ollen Gross, M.D.   ALP/MEDQ  D:  12/02/2002  T:  12/03/2002  Job:  [id number]

## 2011-02-20 LAB — POCT CARDIAC MARKERS
Myoglobin, poc: 57.8
Myoglobin, poc: 62.2
Operator id: 270651

## 2011-02-20 LAB — I-STAT 8, (EC8 V) (CONVERTED LAB)
Acid-Base Excess: 1
BUN: 17
Bicarbonate: 22.5
HCT: 43
Operator id: 270651
pCO2, Ven: 27.3 — ABNORMAL LOW

## 2011-02-20 LAB — CBC
Platelets: 243
RDW: 14.2

## 2011-02-20 LAB — DIFFERENTIAL
Basophils Absolute: 0
Lymphocytes Relative: 34
Neutro Abs: 4.4

## 2011-02-20 LAB — D-DIMER, QUANTITATIVE: D-Dimer, Quant: 0.28

## 2011-02-20 LAB — POCT I-STAT CREATININE: Creatinine, Ser: 1.4 — ABNORMAL HIGH

## 2011-02-24 LAB — COMPREHENSIVE METABOLIC PANEL
Albumin: 3.5
BUN: 10
Calcium: 9
Creatinine, Ser: 0.99
Total Protein: 6.5

## 2011-02-24 LAB — URINALYSIS, ROUTINE W REFLEX MICROSCOPIC
Bilirubin Urine: NEGATIVE
Glucose, UA: NEGATIVE
Hgb urine dipstick: NEGATIVE
Ketones, ur: NEGATIVE
Protein, ur: NEGATIVE

## 2011-02-24 LAB — CBC
HCT: 37.1
MCHC: 33.3
MCV: 83.8
Platelets: 234
RDW: 14.1

## 2011-02-24 LAB — DIFFERENTIAL
Lymphocytes Relative: 25
Monocytes Absolute: 0.9
Monocytes Relative: 10
Neutro Abs: 5.5
Neutrophils Relative %: 64

## 2011-03-03 LAB — CBC
HCT: 36.9 % (ref 36.0–46.0)
HCT: 37 % (ref 36.0–46.0)
HCT: 37.3 % (ref 36.0–46.0)
HCT: 38.3 % (ref 36.0–46.0)
Hemoglobin: 12.3 g/dL (ref 12.0–15.0)
Hemoglobin: 12.5 g/dL (ref 12.0–15.0)
Hemoglobin: 12.8 g/dL (ref 12.0–15.0)
MCHC: 33.1 g/dL (ref 30.0–36.0)
MCHC: 33.3 g/dL (ref 30.0–36.0)
MCHC: 33.4 g/dL (ref 30.0–36.0)
MCV: 84.6 fL (ref 78.0–100.0)
MCV: 85.3 fL (ref 78.0–100.0)
MCV: 85.3 fL (ref 78.0–100.0)
MCV: 85.6 fL (ref 78.0–100.0)
Platelets: 208 K/uL (ref 150–400)
Platelets: 252 10*3/uL (ref 150–400)
RBC: 4.33 MIL/uL (ref 3.87–5.11)
RBC: 4.38 MIL/uL (ref 3.87–5.11)
RBC: 4.39 MIL/uL (ref 3.87–5.11)
RBC: 4.4 MIL/uL (ref 3.87–5.11)
RBC: 4.49 MIL/uL (ref 3.87–5.11)
RBC: 4.62 MIL/uL (ref 3.87–5.11)
RDW: 13.4 % (ref 11.5–15.5)
RDW: 13.9 % (ref 11.5–15.5)
RDW: 13.9 % (ref 11.5–15.5)
WBC: 5.6 10*3/uL (ref 4.0–10.5)
WBC: 6 K/uL (ref 4.0–10.5)
WBC: 6.5 10*3/uL (ref 4.0–10.5)
WBC: 6.5 10*3/uL (ref 4.0–10.5)
WBC: 6.8 10*3/uL (ref 4.0–10.5)

## 2011-03-03 LAB — DIFFERENTIAL
Basophils Absolute: 0 10*3/uL (ref 0.0–0.1)
Basophils Absolute: 0 K/uL (ref 0.0–0.1)
Basophils Relative: 0 % (ref 0–1)
Basophils Relative: 1 % (ref 0–1)
Eosinophils Absolute: 0 10*3/uL (ref 0.0–0.7)
Eosinophils Absolute: 0 10*3/uL (ref 0.0–0.7)
Eosinophils Absolute: 0.1 K/uL (ref 0.0–0.7)
Eosinophils Relative: 1 % (ref 0–5)
Lymphocytes Relative: 25 % (ref 12–46)
Lymphocytes Relative: 29 % (ref 12–46)
Lymphs Abs: 1.6 10*3/uL (ref 0.7–4.0)
Lymphs Abs: 1.7 K/uL (ref 0.7–4.0)
Monocytes Absolute: 0.3 10*3/uL (ref 0.1–1.0)
Monocytes Absolute: 0.5 K/uL (ref 0.1–1.0)
Monocytes Relative: 8 % (ref 3–12)
Monocytes Relative: 9 % (ref 3–12)
Neutro Abs: 3.6 K/uL (ref 1.7–7.7)
Neutro Abs: 4.2 10*3/uL (ref 1.7–7.7)
Neutrophils Relative %: 61 % (ref 43–77)
Neutrophils Relative %: 66 % (ref 43–77)
Neutrophils Relative %: 69 % (ref 43–77)

## 2011-03-03 LAB — URINALYSIS, ROUTINE W REFLEX MICROSCOPIC
Glucose, UA: NEGATIVE mg/dL
Glucose, UA: NEGATIVE mg/dL
Nitrite: NEGATIVE
Protein, ur: NEGATIVE mg/dL
Protein, ur: NEGATIVE mg/dL
Urobilinogen, UA: 0.2 mg/dL (ref 0.0–1.0)
Urobilinogen, UA: 0.2 mg/dL (ref 0.0–1.0)

## 2011-03-03 LAB — COMPREHENSIVE METABOLIC PANEL
ALT: 19 U/L (ref 0–35)
ALT: 25 U/L (ref 0–35)
AST: 22 U/L (ref 0–37)
Alkaline Phosphatase: 54 U/L (ref 39–117)
BUN: 14 mg/dL (ref 6–23)
CO2: 30 mEq/L (ref 19–32)
Calcium: 9.3 mg/dL (ref 8.4–10.5)
Chloride: 104 mEq/L (ref 96–112)
Chloride: 99 mEq/L (ref 96–112)
Creatinine, Ser: 1.01 mg/dL (ref 0.4–1.2)
GFR calc non Af Amer: 57 mL/min — ABNORMAL LOW (ref >60)
Glucose, Bld: 121 mg/dL — ABNORMAL HIGH (ref 70–99)
Glucose, Bld: 134 mg/dL — ABNORMAL HIGH (ref 70–99)
Potassium: 3.6 mEq/L (ref 3.5–5.1)
Sodium: 137 mEq/L (ref 135–145)
Sodium: 137 mEq/L (ref 135–145)
Total Bilirubin: 0.4 mg/dL (ref 0.3–1.2)
Total Bilirubin: 0.7 mg/dL (ref 0.3–1.2)

## 2011-03-03 LAB — PROTIME-INR
INR: 1 (ref 0.00–1.49)
Prothrombin Time: 12.8 seconds (ref 11.6–15.2)
Prothrombin Time: 13.1 seconds (ref 11.6–15.2)

## 2011-03-03 LAB — CK TOTAL AND CKMB (NOT AT ARMC)
Relative Index: INVALID (ref 0.0–2.5)
Relative Index: INVALID (ref 0.0–2.5)
Relative Index: INVALID (ref 0.0–2.5)
Total CK: 66 U/L (ref 7–177)
Total CK: 80 U/L (ref 7–177)

## 2011-03-03 LAB — OVA AND PARASITE EXAMINATION

## 2011-03-03 LAB — TROPONIN I
Troponin I: 0.03 ng/mL (ref 0.00–0.06)
Troponin I: 0.07 ng/mL — ABNORMAL HIGH (ref 0.00–0.06)

## 2011-03-03 LAB — B-NATRIURETIC PEPTIDE (CONVERTED LAB): Pro B Natriuretic peptide (BNP): 32 pg/mL (ref 0.0–100.0)

## 2011-03-03 LAB — CLOSTRIDIUM DIFFICILE EIA

## 2011-03-03 LAB — BASIC METABOLIC PANEL
Calcium: 9.1 mg/dL (ref 8.4–10.5)
Calcium: 9.5 mg/dL (ref 8.4–10.5)
Chloride: 106 mEq/L (ref 96–112)
Creatinine, Ser: 1.04 mg/dL (ref 0.4–1.2)
Creatinine, Ser: 1.18 mg/dL (ref 0.4–1.2)
Creatinine, Ser: 1.24 mg/dL — ABNORMAL HIGH (ref 0.4–1.2)
GFR calc Af Amer: 55 mL/min — ABNORMAL LOW (ref >60)
GFR calc Af Amer: 58 mL/min — ABNORMAL LOW (ref >60)
GFR calc Af Amer: >60 mL/min (ref >60)
Potassium: 3.9 mEq/L (ref 3.5–5.1)

## 2011-03-03 LAB — MAGNESIUM
Magnesium: 2.2 mg/dL (ref 1.5–2.5)
Magnesium: 2.4 mg/dL (ref 1.5–2.5)

## 2011-03-03 LAB — AMYLASE: Amylase: 39 U/L (ref 27–131)

## 2011-03-03 LAB — PHOSPHORUS: Phosphorus: 3.9 mg/dL (ref 2.3–4.6)

## 2011-03-03 LAB — URINE MICROSCOPIC-ADD ON

## 2011-03-03 LAB — LIPID PANEL
Cholesterol: 124 mg/dL (ref 0–200)
LDL Cholesterol: 51 mg/dL (ref 0–99)
Total CHOL/HDL Ratio: 4.8 RATIO
VLDL: 47 mg/dL — ABNORMAL HIGH (ref 0–40)

## 2011-03-03 LAB — STOOL CULTURE

## 2011-03-03 LAB — APTT: aPTT: 35 seconds (ref 24–37)

## 2011-03-03 LAB — TSH: TSH: 1.343 u[IU]/mL (ref 0.350–4.500)

## 2011-03-03 LAB — POCT CARDIAC MARKERS: CKMB, poc: <1 ng/mL — ABNORMAL LOW (ref 1.0–8.0)

## 2011-03-03 LAB — D-DIMER, QUANTITATIVE: D-Dimer, Quant: 0.29 ug/mL-FEU (ref 0.00–0.48)

## 2012-09-07 ENCOUNTER — Emergency Department (HOSPITAL_COMMUNITY): Payer: No Typology Code available for payment source

## 2012-09-07 ENCOUNTER — Emergency Department (HOSPITAL_COMMUNITY)
Admission: EM | Admit: 2012-09-07 | Discharge: 2012-09-07 | Disposition: A | Discharge status: Home / Self Care | Payer: No Typology Code available for payment source | Attending: Emergency Medicine | Admitting: Emergency Medicine

## 2012-09-07 DIAGNOSIS — S62233A Other displaced fracture of base of first metacarpal bone, unspecified hand, initial encounter for closed fracture: Secondary | ICD-10-CM | POA: Insufficient documentation

## 2012-09-07 DIAGNOSIS — Y92410 Unspecified street and highway as the place of occurrence of the external cause: Secondary | ICD-10-CM | POA: Insufficient documentation

## 2012-09-07 DIAGNOSIS — S199XXA Unspecified injury of neck, initial encounter: Secondary | ICD-10-CM | POA: Insufficient documentation

## 2012-09-07 DIAGNOSIS — IMO0002 Reserved for concepts with insufficient information to code with codable children: Secondary | ICD-10-CM | POA: Insufficient documentation

## 2012-09-07 DIAGNOSIS — S62319A Displaced fracture of base of unspecified metacarpal bone, initial encounter for closed fracture: Secondary | ICD-10-CM | POA: Insufficient documentation

## 2012-09-07 DIAGNOSIS — S62201A Unspecified fracture of first metacarpal bone, right hand, initial encounter for closed fracture: Secondary | ICD-10-CM

## 2012-09-07 DIAGNOSIS — S0993XA Unspecified injury of face, initial encounter: Secondary | ICD-10-CM | POA: Insufficient documentation

## 2012-09-07 DIAGNOSIS — T148XXA Other injury of unspecified body region, initial encounter: Secondary | ICD-10-CM | POA: Insufficient documentation

## 2012-09-07 DIAGNOSIS — Y9389 Activity, other specified: Secondary | ICD-10-CM | POA: Insufficient documentation

## 2012-09-07 MED ORDER — CYCLOBENZAPRINE HCL 5 MG PO TABS: 5.0000 mg | ORAL_TABLET | Freq: Two times a day (BID) | ORAL | Status: DC | PRN

## 2012-09-07 MED ORDER — DIAZEPAM 5 MG PO TABS
5.0000 mg | ORAL_TABLET | Freq: Once | ORAL | Status: AC
  Administered 2012-09-07: 5 mg via ORAL
  Filled 2012-09-07: qty 1

## 2012-09-07 MED ORDER — HYDROCODONE-ACETAMINOPHEN 5-325 MG PO TABS
1.0000 | ORAL_TABLET | Freq: Once | ORAL | Status: AC
  Administered 2012-09-07: 1 via ORAL
  Filled 2012-09-07: qty 1

## 2012-09-07 MED ORDER — HYDROCODONE-ACETAMINOPHEN 5-325 MG PO TABS: 1.0000 | ORAL_TABLET | Freq: Four times a day (QID) | ORAL | Status: DC | PRN

## 2012-09-07 NOTE — ED Notes (Signed)
Pt discharged.Vital signs stable and GCS 15 

## 2012-09-07 NOTE — ED Provider Notes (Signed)
Medical screening examination/treatment/procedure(s) were performed by non-physician practitioner and as supervising physician I was immediately available for consultation/collaboration.   Glynn Octave, MD 09/07/12 2233

## 2012-09-07 NOTE — Progress Notes (Signed)
Orthopedic Tech Progress Note Patient Details:  Karen Scott 24-Jan-1956 [id number]  Ortho Devices Type of Ortho Device: Arm sling;Thumb spica splint Ortho Device/Splint Location: right hand/arm Ortho Device/Splint Interventions: Application   Arantxa Piercey 09/07/2012, 10:48 PM
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
## 2012-11-01 ENCOUNTER — Encounter (HOSPITAL_COMMUNITY): Payer: Self-pay | Admitting: *Deleted

## 2012-11-01 ENCOUNTER — Emergency Department (HOSPITAL_COMMUNITY)
Admission: EM | Admit: 2012-11-01 | Discharge: 2012-11-01 | Disposition: A | Discharge status: Home / Self Care | Payer: Managed Care, Other (non HMO) | Attending: Emergency Medicine | Admitting: Emergency Medicine

## 2012-11-01 ENCOUNTER — Emergency Department (HOSPITAL_COMMUNITY): Payer: Managed Care, Other (non HMO)

## 2012-11-01 DIAGNOSIS — R51 Headache: Secondary | ICD-10-CM | POA: Insufficient documentation

## 2012-11-01 DIAGNOSIS — Z79899 Other long term (current) drug therapy: Secondary | ICD-10-CM | POA: Insufficient documentation

## 2012-11-01 DIAGNOSIS — I10 Essential (primary) hypertension: Secondary | ICD-10-CM | POA: Insufficient documentation

## 2012-11-01 DIAGNOSIS — R200 Anesthesia of skin: Secondary | ICD-10-CM

## 2012-11-01 DIAGNOSIS — E119 Type 2 diabetes mellitus without complications: Secondary | ICD-10-CM | POA: Insufficient documentation

## 2012-11-01 DIAGNOSIS — Z7982 Long term (current) use of aspirin: Secondary | ICD-10-CM | POA: Insufficient documentation

## 2012-11-01 DIAGNOSIS — Z880 Allergy status to penicillin: Secondary | ICD-10-CM | POA: Insufficient documentation

## 2012-11-01 DIAGNOSIS — Z8673 Personal history of transient ischemic attack (TIA), and cerebral infarction without residual deficits: Secondary | ICD-10-CM | POA: Insufficient documentation

## 2012-11-01 DIAGNOSIS — H538 Other visual disturbances: Secondary | ICD-10-CM | POA: Insufficient documentation

## 2012-11-01 DIAGNOSIS — R519 Headache, unspecified: Secondary | ICD-10-CM

## 2012-11-01 DIAGNOSIS — R209 Unspecified disturbances of skin sensation: Secondary | ICD-10-CM | POA: Insufficient documentation

## 2012-11-01 DIAGNOSIS — H539 Unspecified visual disturbance: Secondary | ICD-10-CM

## 2012-11-01 HISTORY — DX: Type 2 diabetes mellitus without complications: E11.9

## 2012-11-01 HISTORY — DX: Essential (primary) hypertension: I10

## 2012-11-01 LAB — URINALYSIS, ROUTINE W REFLEX MICROSCOPIC
Bilirubin Urine: NEGATIVE
Ketones, ur: NEGATIVE mg/dL
Nitrite: NEGATIVE
Protein, ur: NEGATIVE mg/dL
Specific Gravity, Urine: 1.015 (ref 1.005–1.030)
Urobilinogen, UA: 0.2 mg/dL (ref 0.0–1.0)

## 2012-11-01 LAB — CBC
MCHC: 34.5 g/dL (ref 30.0–36.0)
Platelets: 214 10*3/uL (ref 150–400)
RDW: 13.1 % (ref 11.5–15.5)
WBC: 6.7 10*3/uL (ref 4.0–10.5)

## 2012-11-01 LAB — POCT I-STAT, CHEM 8
Calcium, Ion: 1.2 mmol/L (ref 1.12–1.23)
Creatinine, Ser: 1.1 mg/dL (ref 0.50–1.10)
Glucose, Bld: 98 mg/dL (ref 70–99)
HCT: 40 % (ref 36.0–46.0)
Hemoglobin: 13.6 g/dL (ref 12.0–15.0)
Potassium: 3.3 mEq/L — ABNORMAL LOW (ref 3.5–5.1)
TCO2: 29 mmol/L (ref 0–100)

## 2012-11-01 LAB — COMPREHENSIVE METABOLIC PANEL
ALT: 18 U/L (ref 0–35)
AST: 21 U/L (ref 0–37)
Albumin: 4 g/dL (ref 3.5–5.2)
CO2: 28 mEq/L (ref 19–32)
Calcium: 9.8 mg/dL (ref 8.4–10.5)
Chloride: 100 mEq/L (ref 96–112)
Creatinine, Ser: 1.02 mg/dL (ref 0.50–1.10)
GFR calc non Af Amer: 59 mL/min — ABNORMAL LOW (ref >90)
Sodium: 137 mEq/L (ref 135–145)

## 2012-11-01 LAB — APTT: aPTT: 30 seconds (ref 24–37)

## 2012-11-01 LAB — ETHANOL: Alcohol, Ethyl (B): <11 mg/dL (ref 0–11)

## 2012-11-01 LAB — DIFFERENTIAL
Basophils Absolute: 0 10*3/uL (ref 0.0–0.1)
Basophils Relative: 0 % (ref 0–1)
Eosinophils Relative: 1 % (ref 0–5)
Lymphocytes Relative: 28 % (ref 12–46)
Neutro Abs: 4.2 10*3/uL (ref 1.7–7.7)

## 2012-11-01 LAB — RAPID URINE DRUG SCREEN, HOSP PERFORMED
Amphetamines: NOT DETECTED
Barbiturates: NOT DETECTED
Cocaine: NOT DETECTED
Opiates: NOT DETECTED
Tetrahydrocannabinol: NOT DETECTED

## 2012-11-01 LAB — GLUCOSE, CAPILLARY: Glucose-Capillary: 92 mg/dL (ref 70–99)

## 2012-11-01 LAB — PROTIME-INR: Prothrombin Time: 11.7 seconds (ref 11.6–15.2)

## 2012-11-01 LAB — URINE MICROSCOPIC-ADD ON

## 2012-11-01 MED ORDER — LORAZEPAM 2 MG/ML IJ SOLN
1.0000 mg | Freq: Once | INTRAMUSCULAR | Status: AC
  Administered 2012-11-01: 1 mg via INTRAVENOUS
  Filled 2012-11-01: qty 1

## 2012-11-01 MED ORDER — METOCLOPRAMIDE HCL 5 MG/ML IJ SOLN
20.0000 mg | Freq: Once | INTRAVENOUS | Status: DC
  Filled 2012-11-01: qty 4

## 2012-11-01 MED ORDER — ACETAMINOPHEN 500 MG PO TABS
1000.0000 mg | ORAL_TABLET | Freq: Once | ORAL | Status: AC
  Administered 2012-11-01: 1000 mg via ORAL
  Filled 2012-11-01: qty 2

## 2012-11-01 MED ORDER — DIPHENHYDRAMINE HCL 50 MG/ML IJ SOLN
25.0000 mg | Freq: Once | INTRAMUSCULAR | Status: AC
  Administered 2012-11-01: 25 mg via INTRAVENOUS
  Filled 2012-11-01: qty 1

## 2012-11-01 MED ORDER — KETOROLAC TROMETHAMINE 30 MG/ML IJ SOLN
30.0000 mg | Freq: Once | INTRAMUSCULAR | Status: AC
  Administered 2012-11-01: 30 mg via INTRAVENOUS
  Filled 2012-11-01: qty 1

## 2012-11-01 MED ORDER — BUTALBITAL-APAP-CAFFEINE 50-325-40 MG PO TABS: 1.0000 | ORAL_TABLET | Freq: Two times a day (BID) | ORAL | Status: DC | PRN

## 2012-11-01 MED ORDER — SODIUM CHLORIDE 0.9 % IV SOLN
Freq: Once | INTRAVENOUS | Status: AC
  Administered 2012-11-01: 19:00:00 via INTRAVENOUS

## 2012-11-01 MED ORDER — ACETAMINOPHEN 325 MG PO TABS: 650.0000 mg | ORAL_TABLET | Freq: Once | ORAL | Status: DC

## 2012-11-01 NOTE — ED Notes (Signed)
Patient transported to MRI 

## 2012-11-01 NOTE — ED Notes (Signed)
Patient is alert and oriented x3.  She was given DC instructions and follow up visit instructions.  Patient gave verbal understanding. She was DC ambulatory under her own power to home.  V/S stable.  He was not showing any signs of distress on DC 

## 2012-11-01 NOTE — ED Provider Notes (Signed)
History     CSN: [id number]  Arrival date & time 11/01/12  1445   First MD Initiated Contact with Patient 11/01/12 1508      Chief Complaint  Patient presents with  . Blurred Vision  . Numbness    back of head    HPI  Patient presents with concern of left facial dysesthesia, visual changes. She states that this episode began in earnest today, over the past 2 days she has had intermittent left eye visual blurriness.  As blurriness has been intermittent, without clear exacerbating or alleviating factors. Today, after an episode of blurry vision in the hours prior to arrival, the patient had left facial dysesthesia. This was throughout the left side of her face, without associated dysphagia, asymmetry, upper or lower extremity weakness. There is concurrent posterior left head pain, sore. No clear alleviating or exacerbating factors for these symptoms. The patient states that she had similar symptoms one year ago when she was diagnosed with stroke at another hospital. At that point she had CT scan which was negative. Subsequent followup with neurology was inconclusive. She has diabetes, hypertension.   Past Medical History  Diagnosis Date  . Stroke   . Hypertension   . Diabetes mellitus without complication     Past Surgical History  Procedure Laterality Date  . Cholecystectomy    . Breast surgery    . Ankle surgery      left ankle ligament repair    No family history on file.  History  Substance Use Topics  . Smoking status: Never Smoker   . Smokeless tobacco: Not on file  . Alcohol Use: Yes     Comment: socially    OB History   Grav Para Term Preterm Abortions TAB SAB Ect Mult Living                  Review of Systems  Constitutional:       Per HPI, otherwise negative  HENT:       Per HPI, otherwise negative  Respiratory:       Per HPI, otherwise negative  Cardiovascular:       Per HPI, otherwise negative  Gastrointestinal: Negative for vomiting.   Endocrine:       Negative aside from HPI  Genitourinary:       Neg aside from HPI   Musculoskeletal:       Per HPI, otherwise negative  Skin: Negative.   Neurological: Positive for numbness and headaches. Negative for syncope.    Allergies  Penicillins  Home Medications   Current Outpatient Rx  Name  Route  Sig  Dispense  Refill  . aspirin 81 MG chewable tablet   Oral   Chew 81 mg by mouth every morning.         Marland Kitchen atorvastatin (LIPITOR) 20 MG tablet   Oral   Take 20 mg by mouth daily.         Marland Kitchen losartan-hydrochlorothiazide (HYZAAR) 100-25 MG per tablet   Oral   Take 1 tablet by mouth daily.         . metFORMIN (GLUCOPHAGE-XR) 500 MG 24 hr tablet   Oral   Take 500 mg by mouth 2 (two) times daily.           BP 150/93  Pulse 107  Temp(Src) 98.4 F (36.9 C)  Resp 20  SpO2 97%  Physical Exam  Nursing note and vitals reviewed. Constitutional: She is oriented to person, place, and time. She  appears well-developed and well-nourished. No distress.  HENT:  Head: Normocephalic and atraumatic.  Eyes: Conjunctivae and EOM are normal.  Cardiovascular: Normal rate and regular rhythm.   Pulmonary/Chest: Effort normal and breath sounds normal. No stridor. No respiratory distress.  Abdominal: She exhibits no distension.  Musculoskeletal: She exhibits no edema.  Neurological: She is alert and oriented to person, place, and time. She displays no atrophy and no tremor. No cranial nerve deficit or sensory deficit. She exhibits normal muscle tone. She displays no seizure activity. Coordination normal.  Skin: Skin is warm and dry.  Psychiatric: She has a normal mood and affect.    ED Course  Procedures (including critical care time)  Labs Reviewed  COMPREHENSIVE METABOLIC PANEL - Abnormal; Notable for the following:    Potassium 3.4 (*)    Glucose, Bld 102 (*)    GFR calc non Af Amer 59 (*)    GFR calc Af Amer 69 (*)    All other components within normal limits   URINALYSIS, ROUTINE W REFLEX MICROSCOPIC - Abnormal; Notable for the following:    Hgb urine dipstick TRACE (*)    All other components within normal limits  POCT I-STAT, CHEM 8 - Abnormal; Notable for the following:    Potassium 3.3 (*)    All other components within normal limits  ETHANOL  PROTIME-INR  APTT  CBC  DIFFERENTIAL  TROPONIN I  URINE RAPID DRUG SCREEN (HOSP PERFORMED)  GLUCOSE, CAPILLARY  URINE MICROSCOPIC-ADD ON  POCT I-STAT TROPONIN I   Ct Head Wo Contrast  11/01/2012   *RADIOLOGY REPORT*  Clinical Data: Left-sided facial numbness and pain.  CT HEAD WITHOUT CONTRAST  Technique:  Contiguous axial images were obtained from the base of the skull through the vertex without contrast.  Comparison: 08/27/2004  Findings: There is no evidence for acute hemorrhage, hydrocephalus, mass lesion, or abnormal extra-axial fluid collection.  No definite CT evidence for acute infarction.  The visualized paranasal sinuses and mastoid air cells are clear.  IMPRESSION: Normal exam.   Original Report Authenticated By: Kennith Center, M.D.     No diagnosis found. Pulse ox 90% room air normal Cardiac 101 sinus tach abnormal    Date: 11/01/2012  Rate: 74  Rhythm: normal sinus rhythm  QRS Axis: left  Intervals: normal  ST/T Wave abnormalities: normal  Conduction Disutrbances:none  Narrative Interpretation:   Old EKG Reviewed: unchanged  ABNORMAL   Update: Patient has increasing pain in the posterior of her head.  She continues to describe dysesthesia about the face. Though the initial CT was negative, with her continued complaints, mild risk factors for CVA, his MRI was ordered. 6:56 PM Patient informed of all results.  She continues to have mild posterior head pain but no other new complaints.  7:51 PM Patient much better following initiation of fluids, migraine cocktail.  She is laughing, smiling.  Discuss results with the patient's family again.  She was discharged in stable  condition MDM  Patient presents with left facial dysesthesia, intermittent visual changes.  On exam she is awake alert, or vision intact, objectively there are no notable neurologic findings. Given the patient's description of diagnosis of stroke previously, her endorsement of these symptoms as being similar to that event, this is a consideration, though atypical migraine also seems possible. The patient's CT and MRI were both reassuring him a felt improvement with migraine cocktail, migraine seems a more likely etiology.  Patient has a ABCD 2 score of 2, and will require additional  evaluation with neurology an outpatient.  However, given the absence of objective findings, she was appropriate for discharge with further management as an outpatient.        Gerhard Munch, MD 11/01/12 [phone]

## 2012-11-01 NOTE — ED Notes (Signed)
Called CT to inquire about delay, they stated they will come to pick pt up in few minutes.

## 2012-11-01 NOTE — Progress Notes (Signed)
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

## 2012-11-01 NOTE — ED Notes (Signed)
Pt reports numbness in the back of head and blurred vision sts those are same symptoms she had last year when she had stroke. Pt denies slurred speech or one sided weakness. Dr Jeraldine Loots at bedside. Onset of symptoms was around 2:00 pm.

## 2013-10-12 ENCOUNTER — Ambulatory Visit (INDEPENDENT_AMBULATORY_CARE_PROVIDER_SITE_OTHER): Payer: Managed Care, Other (non HMO) | Admitting: Family Medicine

## 2013-10-12 ENCOUNTER — Ambulatory Visit: Payer: Managed Care, Other (non HMO)

## 2013-10-12 VITALS — BP 132/74 | HR 76 | Temp 97.7°F | Resp 16 | Ht 61.5 in | Wt 171.2 lb

## 2013-10-12 DIAGNOSIS — M4125 Other idiopathic scoliosis, thoracolumbar region: Secondary | ICD-10-CM

## 2013-10-12 DIAGNOSIS — E119 Type 2 diabetes mellitus without complications: Secondary | ICD-10-CM

## 2013-10-12 DIAGNOSIS — E1169 Type 2 diabetes mellitus with other specified complication: Secondary | ICD-10-CM | POA: Insufficient documentation

## 2013-10-12 DIAGNOSIS — M4120 Other idiopathic scoliosis, site unspecified: Secondary | ICD-10-CM

## 2013-10-12 DIAGNOSIS — M79601 Pain in right arm: Secondary | ICD-10-CM

## 2013-10-12 DIAGNOSIS — I10 Essential (primary) hypertension: Secondary | ICD-10-CM | POA: Insufficient documentation

## 2013-10-12 DIAGNOSIS — Z8673 Personal history of transient ischemic attack (TIA), and cerebral infarction without residual deficits: Secondary | ICD-10-CM | POA: Insufficient documentation

## 2013-10-12 DIAGNOSIS — M79609 Pain in unspecified limb: Secondary | ICD-10-CM

## 2013-10-12 LAB — GLUCOSE, POCT (MANUAL RESULT ENTRY): POC Glucose: 114 mg/dl — AB (ref 70–99)

## 2013-10-12 MED ORDER — MELOXICAM 7.5 MG PO TABS: ORAL_TABLET | ORAL | Status: DC

## 2013-10-12 MED ORDER — METHOCARBAMOL 500 MG PO TABS: 500.0000 mg | ORAL_TABLET | Freq: Three times a day (TID) | ORAL | Status: DC | PRN

## 2013-10-12 NOTE — Patient Instructions (Signed)
Use the robaxin (muscle relaxer) as needed for pain and spasm.  Remember it may make you feel a bit drowsy.  You can also use the mobic as needed for pain and inflammation- 1 or 2 daily.    Let me know if you are not feeling better in the next few days- Sooner if worse.   If you have any change in your sx also let me know

## 2013-10-12 NOTE — Progress Notes (Signed)
[facility] [address] [place] [address] [phone]  Date:  10/12/2013   Name:  PAULETT KAUFHOLD   DOB:  1956/05/18   MRN:  [id number]  PCP:  Horatio Pel, MD    Chief Complaint: Back Pain   History of Present Illness:  Karen Scott is a 58 y.o. very pleasant female patient who presents with the following:  History of stroke, HTN and DM.  Pt of Dr. Shelia Media, meds as per list.   About 3 days ago she awoke with a "crick" in her right shoulder.  She thought that perhaps she slept wrong.  She tried a massage- felt good during the massage but then her sx returned.  She is tender in an area over her right shoulderblade.  Now she has a tingling and numbness going down the right arm and into the ring and pinky fingers.  This feeling is intermittent. 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  She notes that her arms feel more tired than usual.  She has tried tylenol, aleve, topical patches.  Nothing is helping her.   She has never had this in the past; no particular history of back or neck problems except for the occasinal ache or pain No recent injury or accident. No CP or SOB.   There are no active problems to display for this patient.   Past Medical History  Diagnosis Date  . Stroke   . Hypertension   . Diabetes mellitus without complication     Past Surgical History  Procedure Laterality Date  . Cholecystectomy    . Breast surgery    . Ankle surgery      left ankle ligament repair  . Joint replacement  2008    History  Substance Use Topics  . Smoking status: Never Smoker   . Smokeless tobacco: Not on file  . Alcohol Use: No     Comment: socially    Family History  Problem Relation Age of Onset  . Diabetes Mother   . Hyperlipidemia Mother   . Hypertension Mother   . Stroke Mother   . Cancer Father   . Hearing loss Maternal Grandmother   . Hearing loss  Paternal Grandmother   . Hearing loss Paternal Grandfather     Allergies  Allergen Reactions  . Penicillins     All "myocins" : unknown    Medication list has been reviewed and updated.  Current Outpatient Prescriptions on File Prior to Visit  Medication Sig Dispense Refill  . aspirin 81 MG chewable tablet Chew 81 mg by mouth every morning.      Marland Kitchen atorvastatin (LIPITOR) 20 MG tablet Take 20 mg by mouth daily.      . butalbital-acetaminophen-caffeine (FIORICET, ESGIC) 50-325-40 MG per tablet Take 1 tablet by mouth 2 (two) times daily as needed for headache.  14 tablet  0  . losartan-hydrochlorothiazide (HYZAAR) 100-25 MG per tablet Take 1 tablet by mouth daily.      . metFORMIN (GLUCOPHAGE-XR) 500 MG 24 hr tablet Take 500 mg by mouth 2 (two) times daily.       No current facility-administered medications on file prior to visit.    Review of Systems:  As per HPI- otherwise negative.   Physical Examination: Filed Vitals:   10/12/13 1544  BP: 132/74  Pulse: 76  Temp: 97.7 F (36.5 C)  Resp:  [place]:   10/12/13 1544  Height: 5' 1.5" (1.562 m)  Weight: 171 lb 3.2 oz (77.656 kg)   Body mass index is 31.83 kg/(m^2). Ideal Body Weight: Weight in (lb) to have BMI = 25: 134.2  GEN: WDWN, NAD, Non-toxic, A & O x 3, obese, looks well HEENT: Atraumatic, Normocephalic. Neck supple. No masses, No LAD. Ears and Nose: No external deformity. CV: RRR, No M/G/R. No JVD. No thrill. No extra heart sounds. PULM: CTA B, no wheezes, crackles, rhonchi. No retractions. No resp. distress. No accessory muscle use. EXTR: No c/c/e NEURO Normal gait.  PSYCH: Normally interactive. Conversant. Not depressed or anxious appearing.  Calm demeanor.  Cervical spine is non- tender with normal ROM.  She is tender over the right Paraspinous muscles, right side thoracic area.  Pressing here reproduces the pain that she has noted. No redness or lesion on the skin.  She has equal strength of both  UE, normal bicep DTR, normal shoulder ROM  [facility] reading (PRIMARY) by  Dr. Lorelei Pont. C spine: moderate degenerative change T spine: moderate scoliosis and degenerative change  CERVICAL SPINE 4+ VIEWS  COMPARISON: None.  FINDINGS: There is no evidence of cervical spine fracture or prevertebral soft tissue swelling. Alignment is normal. Multiple level mild degenerative disc disease changes.  IMPRESSION: No acute osseous abnormalities. Multilevel mild degenerative disc disease changes.  THORACIC SPINE - 2 VIEW  COMPARISON: None.  FINDINGS: There is no evidence of thoracic spine fracture. There is S-shaped scoliosis of the thoracolumbar spine. Multilevel degenerative disc disease changes are appreciated within the thoracic spine. Areas of mild disc space narrowing.  IMPRESSION: Scoliosis and multilevel mild disc space narrowing.  Results for orders placed in visit on 10/12/13  GLUCOSE, POCT (MANUAL RESULT ENTRY)      Result Value Ref Range   POC Glucose 114 (*) 70 - 99 mg/dl   Assessment and Plan: Right arm pain - Plan: DG Cervical Spine Complete, DG Thoracic Spine 2 View, methocarbamol (ROBAXIN) 500 MG tablet, meloxicam (MOBIC) 7.5 MG tablet  Type II or unspecified type diabetes mellitus without mention of complication, not stated as uncontrolled - Plan: POCT glucose (manual entry)  Other idiopathic scoliosis, thoracolumbar region  Suspect pinched nerve due to muscle spasm Treat with mobic and robaxin.  Could consider prednisone as her DM appears to be under good control; she will let me know if not better.    Signed Lamar Blinks, MD

## 2013-11-17 ENCOUNTER — Other Ambulatory Visit: Payer: Self-pay | Admitting: Internal Medicine

## 2013-11-17 DIAGNOSIS — I83812 Varicose veins of left lower extremities with pain: Secondary | ICD-10-CM

## 2013-11-25 ENCOUNTER — Ambulatory Visit
Admission: RE | Admit: 2013-11-25 | Discharge: 2013-11-25 | Disposition: A | Discharge status: Home / Self Care | Payer: Managed Care, Other (non HMO) | Source: Ambulatory Visit | Attending: Internal Medicine | Admitting: Internal Medicine

## 2013-11-25 ENCOUNTER — Other Ambulatory Visit: Payer: Self-pay | Admitting: Internal Medicine

## 2013-11-25 ENCOUNTER — Encounter (INDEPENDENT_AMBULATORY_CARE_PROVIDER_SITE_OTHER): Payer: Self-pay

## 2013-11-25 DIAGNOSIS — I83812 Varicose veins of left lower extremities with pain: Secondary | ICD-10-CM

## 2013-12-17 ENCOUNTER — Other Ambulatory Visit (HOSPITAL_COMMUNITY): Payer: Self-pay | Admitting: Diagnostic Radiology

## 2013-12-17 DIAGNOSIS — I83812 Varicose veins of left lower extremities with pain: Secondary | ICD-10-CM

## 2014-05-06 ENCOUNTER — Telehealth: Payer: Self-pay | Admitting: Internal Medicine

## 2015-05-11 ENCOUNTER — Encounter: Payer: Self-pay | Admitting: *Deleted

## 2015-05-12 ENCOUNTER — Ambulatory Visit (INDEPENDENT_AMBULATORY_CARE_PROVIDER_SITE_OTHER): Payer: PRIVATE HEALTH INSURANCE | Admitting: Internal Medicine

## 2015-05-12 ENCOUNTER — Encounter: Payer: Self-pay | Admitting: Internal Medicine

## 2015-05-12 ENCOUNTER — Other Ambulatory Visit (INDEPENDENT_AMBULATORY_CARE_PROVIDER_SITE_OTHER): Payer: PRIVATE HEALTH INSURANCE

## 2015-05-12 VITALS — BP 120/80 | HR 80 | Ht 63.0 in | Wt 168.1 lb

## 2015-05-12 DIAGNOSIS — R1013 Epigastric pain: Secondary | ICD-10-CM | POA: Diagnosis not present

## 2015-05-12 DIAGNOSIS — K219 Gastro-esophageal reflux disease without esophagitis: Secondary | ICD-10-CM | POA: Diagnosis not present

## 2015-05-12 DIAGNOSIS — Z86010 Personal history of colonic polyps: Secondary | ICD-10-CM

## 2015-05-12 LAB — IGA: IGA: 193 mg/dL (ref 68–378)

## 2015-05-12 MED ORDER — NA SULFATE-K SULFATE-MG SULF 17.5-3.13-1.6 GM/177ML PO SOLN: ORAL | Status: DC

## 2015-05-12 MED ORDER — DEXLANSOPRAZOLE 60 MG PO CPDR
60.0000 mg | DELAYED_RELEASE_CAPSULE | Freq: Every day | ORAL | Status: DC
Start: 2015-05-12 — End: 2015-05-12

## 2015-05-12 MED ORDER — DEXLANSOPRAZOLE 60 MG PO CPDR: 60.0000 mg | DELAYED_RELEASE_CAPSULE | Freq: Every day | ORAL | Status: DC

## 2015-05-12 NOTE — Patient Instructions (Signed)
You have been scheduled for an endoscopy and colonoscopy. Please follow the written instructions given to you at your visit today. Please pick up your prep supplies at the pharmacy within the next 1-3 days. If you use inhalers (even only as needed), please bring them with you on the day of your procedure. Your physician has requested that you go to www.startemmi.com and enter the access code given to you at your visit today. This web site gives a general overview about your procedure. However, you should still follow specific instructions given to you by our office regarding your preparation for the procedure.  Your physician has requested that you go to the basement for the following lab work before leaving today: IgA, TTG  We have sent the following medications to your pharmacy for you to pick up at your convenience: Dexilant 60 mg daily  Discontinue pantoprazole.  Discontinue Fioricet.

## 2015-05-12 NOTE — Progress Notes (Signed)
Patient ID: [name], female   DOB: 12-26-1955, 59 y.o.   MRN: [id number] HPI: Karen Scott is a 59 year old female with past medical history of GERD, dyspepsia, adenomatous colon polyp who seen in consultation at the request of Dr. Shelia Scott to evaluate GERD and consider surveillance colonoscopy. She's here alone today. Previous GI history was with Dr. Lajoyce Scott. She reports that she's been having issues with flare of GERD and dyspeptic symptoms. She feels heartburn and water brash. No dysphagia or odynophagia. Some epigastric discomfort. Worse at night. She does often eat late at night due to her work schedule. During these flares symptoms are worse for 2-3 weeks and she feels full and abdominal bloating. She's been on pantoprazole 40 mg daily but 5 days ago primary care gave her samples of Dexilant. She's been using 60 mg daily and seems to feel this medicine is working better. Again no dysphagia or odynophagia. No weight loss. History of gallbladder surgery remotely. Bowel movements vary from loose to mostly formed occur 2-4 times daily. Seem to be more loose when her GERD and dyspepsia is "flaring". She denies blood in her stool or melena. No family history of GI malignancy. Her husband died of esophageal cancer in 09/11/2007. She works as a Copy.  See below for relevant GI procedures  Past Medical History  Diagnosis Date  . Stroke ([place])   . Hypertension   . Diabetes mellitus without complication ([place])   . Hyperlipidemia   . CAD (coronary artery disease)   . Hepatic steatosis   . Diverticulosis   . GERD (gastroesophageal reflux disease)   . DVT (deep venous thrombosis) ([place])   . History of CVA (cerebrovascular accident)   . Abdominal wall hernia   . Esophageal stricture   . Carpal tunnel syndrome   . Ovarian cyst   . Adenomatous colon polyp   . Shingles   . Anxiety   . Internal hemorrhoids     Past Surgical History  Procedure Laterality Date  . Cholecystectomy    . Breast  surgery      biopsy  . Ankle surgery      left ankle ligament repair  . Joint replacement  11-Sep-2006    Outpatient Prescriptions Prior to Visit  Medication Sig Dispense Refill  . aspirin 81 MG chewable tablet Chew 81 mg by mouth every morning.    Marland Kitchen atorvastatin (LIPITOR) 20 MG tablet Take 20 mg by mouth daily.    Marland Kitchen losartan-hydrochlorothiazide (HYZAAR) 100-25 MG per tablet Take 1 tablet by mouth daily.    . metFORMIN (GLUCOPHAGE-XR) 500 MG 24 hr tablet Take 500 mg by mouth 2 (two) times daily.    . butalbital-acetaminophen-caffeine (FIORICET, ESGIC) 50-325-40 MG per tablet Take 1 tablet by mouth 2 (two) times daily as needed for headache. 14 tablet 0  . meloxicam (MOBIC) 7.5 MG tablet Take 1 or 2 daily as needed for pain 40 tablet 0  . methocarbamol (ROBAXIN) 500 MG tablet Take 1 tablet (500 mg total) by mouth every 8 (eight) hours as needed for muscle spasms. 30 tablet 0  . pantoprazole (PROTONIX) 40 MG tablet Take 40 mg by mouth daily.     No facility-administered medications prior to visit.    Allergies  Allergen Reactions  . Erythromycin     All in this class  . Penicillins     All "myocins" : unknown    Family History  Problem Relation Age of Onset  . Diabetes Mother   . Hyperlipidemia  Mother   . Hypertension Mother   . Stroke Mother   . Lung cancer Father   . Hearing loss Maternal Grandmother   . Hearing loss Paternal Grandmother   . Hearing loss Paternal Grandfather     Social History  Substance Use Topics  . Smoking status: Never Smoker   . Smokeless tobacco: Never Used  . Alcohol Use: No     Comment: socially    ROS: As per history of present illness, otherwise negative  BP 120/80 mmHg  Pulse 80  Ht 5\' 3"  (1.6 m)  Wt 168 lb 2 oz (76.261 kg)  BMI 29.79 kg/m2 Constitutional: Well-developed and well-nourished. No distress. HEENT: Normocephalic and atraumatic. Oropharynx is clear and moist. No oropharyngeal exudate. Conjunctivae are normal.  No scleral  icterus. Neck: Neck supple. Trachea midline. Cardiovascular: Normal rate, regular rhythm and intact distal pulses. No M/R/G Pulmonary/chest: Effort normal and breath sounds normal. No wheezing, rales or rhonchi. Abdominal: Soft, nontender, nondistended. Bowel sounds active throughout.  Extremities: no clubbing, cyanosis, or edema Lymphadenopathy: No cervical adenopathy noted. Neurological: Alert and oriented to person place and time. Skin: Skin is warm and dry. No rashes noted. Psychiatric: Normal mood and affect. Behavior is normal.  Records reviewed from Dr. Lajoyce Scott Colonoscopy 11/25/2008 -- occasional diverticuli, thickened tortuous left colon, internal hemorrhoids Colonoscopy 01/16/2008 -- small polyp at hepatic flexure removed and found to be adenomatous. Random colon biopsies benign. EGD with dilation 08/15/2007 -- esophageal stricture at GE junction, fundic gland polyps biopsied antral gastritis biopsy. Pathology reactive gastropathy. Benign fundic gland polyps EGD 09/06/2005 -- distal esophageal stricture dilated to 17 mm with savory. Fundic gland polyps. No H. Pylori Colonoscopy 12/25/2001 internal hemorrhoids otherwise unremarkable exam Upper endoscopy 07/31/2000 -- gastric polyps, thickened antral fold. Essentially unremarkable exam Upper endoscopy 10/07/1999 -- prominent esophageal ring dilated to 18 mm and injected with triamcinolone. Benign-appearing gastric polyps  ASSESSMENT/PLAN: 59 year old female with past medical history of GERD, dyspepsia, adenomatous colon polyp who seen in consultation at the request of Dr. Shelia Scott to evaluate GERD and consider surveillance colonoscopy.   1. GERD/dyspepsia -- this is despite pantoprazole 40 mg daily. Ever view GERD diet and hygiene. I recommended she not eat or drink within 90 minutes of lying down. We will continue Dexilant as this seems to working better. Continue Dexilant 60 mg daily 30 minutes before breakfast. Check celiac panel. Given  persistent of symptoms, upper endoscopy recommended. We discussed the risks, benefits and alternatives and she is agreeable to proceed.  2. History of adenomatous colon polyp -- 6 years since last examination. Colonoscopy recommended at this time for polyp surveillance. We discussed the risks, benefits and alternatives and she is agreeable to proceed  3. Dyspepsia/bloating, loose stools -- likely component of irritable bowel. In the past this is improved PPI. Check celiac panel. If upper endoscopy unrevealing and no complete response to PPI consider rifaximin for IBS-D and possible overgrowth. Avoid NSAIDs.     [id number] [place], [place], [place] [id number]

## 2015-05-13 LAB — TISSUE TRANSGLUTAMINASE, IGA: Tissue Transglutaminase Ab, IgA: 1 U/mL (ref <4)

## 2015-05-17 ENCOUNTER — Telehealth: Payer: Self-pay | Admitting: Internal Medicine

## 2015-05-17 NOTE — Telephone Encounter (Signed)
Left message for patient to call back. She needs to contact her insurance company to find out which "proton pump inhibitors" are covered under her plan. Once options are received, she may relay those to [place] and we will send her a different rx.

## 2015-05-17 NOTE — Telephone Encounter (Signed)
Patient returned phone call and I informed her of this.

## 2015-07-01 ENCOUNTER — Telehealth: Payer: Self-pay | Admitting: Internal Medicine

## 2015-07-01 NOTE — Telephone Encounter (Signed)
Patient has already been rescheduled.

## 2015-07-01 NOTE — Telephone Encounter (Signed)
If she reschedules, no charge Dottie please ensure she reschedules

## 2015-07-02 ENCOUNTER — Encounter: Payer: PRIVATE HEALTH INSURANCE | Admitting: Internal Medicine

## 2015-07-12 ENCOUNTER — Encounter (HOSPITAL_BASED_OUTPATIENT_CLINIC_OR_DEPARTMENT_OTHER): Payer: Self-pay | Admitting: *Deleted

## 2015-07-12 ENCOUNTER — Emergency Department (HOSPITAL_BASED_OUTPATIENT_CLINIC_OR_DEPARTMENT_OTHER)
Admission: EM | Admit: 2015-07-12 | Discharge: 2015-07-12 | Disposition: A | Discharge status: Home / Self Care | Payer: PRIVATE HEALTH INSURANCE | Attending: Emergency Medicine | Admitting: Emergency Medicine

## 2015-07-12 ENCOUNTER — Emergency Department (HOSPITAL_BASED_OUTPATIENT_CLINIC_OR_DEPARTMENT_OTHER): Payer: PRIVATE HEALTH INSURANCE

## 2015-07-12 DIAGNOSIS — E785 Hyperlipidemia, unspecified: Secondary | ICD-10-CM | POA: Diagnosis not present

## 2015-07-12 DIAGNOSIS — Z86718 Personal history of other venous thrombosis and embolism: Secondary | ICD-10-CM | POA: Diagnosis not present

## 2015-07-12 DIAGNOSIS — R109 Unspecified abdominal pain: Secondary | ICD-10-CM | POA: Diagnosis present

## 2015-07-12 DIAGNOSIS — R1032 Left lower quadrant pain: Secondary | ICD-10-CM

## 2015-07-12 DIAGNOSIS — Z8673 Personal history of transient ischemic attack (TIA), and cerebral infarction without residual deficits: Secondary | ICD-10-CM | POA: Diagnosis not present

## 2015-07-12 DIAGNOSIS — Z8619 Personal history of other infectious and parasitic diseases: Secondary | ICD-10-CM | POA: Insufficient documentation

## 2015-07-12 DIAGNOSIS — E119 Type 2 diabetes mellitus without complications: Secondary | ICD-10-CM | POA: Diagnosis not present

## 2015-07-12 DIAGNOSIS — Z7984 Long term (current) use of oral hypoglycemic drugs: Secondary | ICD-10-CM | POA: Insufficient documentation

## 2015-07-12 DIAGNOSIS — I2510 Atherosclerotic heart disease of native coronary artery without angina pectoris: Secondary | ICD-10-CM | POA: Diagnosis not present

## 2015-07-12 DIAGNOSIS — K529 Noninfective gastroenteritis and colitis, unspecified: Secondary | ICD-10-CM | POA: Diagnosis not present

## 2015-07-12 DIAGNOSIS — K219 Gastro-esophageal reflux disease without esophagitis: Secondary | ICD-10-CM | POA: Insufficient documentation

## 2015-07-12 DIAGNOSIS — Z8742 Personal history of other diseases of the female genital tract: Secondary | ICD-10-CM | POA: Diagnosis not present

## 2015-07-12 DIAGNOSIS — Z880 Allergy status to penicillin: Secondary | ICD-10-CM | POA: Diagnosis not present

## 2015-07-12 DIAGNOSIS — Z86010 Personal history of colonic polyps: Secondary | ICD-10-CM | POA: Diagnosis not present

## 2015-07-12 DIAGNOSIS — Z7982 Long term (current) use of aspirin: Secondary | ICD-10-CM | POA: Diagnosis not present

## 2015-07-12 DIAGNOSIS — Z79899 Other long term (current) drug therapy: Secondary | ICD-10-CM | POA: Insufficient documentation

## 2015-07-12 DIAGNOSIS — I10 Essential (primary) hypertension: Secondary | ICD-10-CM | POA: Diagnosis not present

## 2015-07-12 LAB — URINALYSIS, ROUTINE W REFLEX MICROSCOPIC
BILIRUBIN URINE: NEGATIVE
GLUCOSE, UA: NEGATIVE mg/dL
KETONES UR: NEGATIVE mg/dL
Nitrite: NEGATIVE
PH: 5.5 (ref 5.0–8.0)
PROTEIN: NEGATIVE mg/dL
Specific Gravity, Urine: 1.03 (ref 1.005–1.030)

## 2015-07-12 LAB — COMPREHENSIVE METABOLIC PANEL
ALK PHOS: 65 U/L (ref 38–126)
ALT: 25 U/L (ref 14–54)
AST: 27 U/L (ref 15–41)
Albumin: 4 g/dL (ref 3.5–5.0)
Anion gap: 12 (ref 5–15)
BILIRUBIN TOTAL: 0.9 mg/dL (ref 0.3–1.2)
BUN: 22 mg/dL — AB (ref 6–20)
CALCIUM: 9.3 mg/dL (ref 8.9–10.3)
CO2: 27 mmol/L (ref 22–32)
CREATININE: 1.12 mg/dL — AB (ref 0.44–1.00)
Chloride: 100 mmol/L — ABNORMAL LOW (ref 101–111)
GFR calc Af Amer: >60 mL/min (ref >60)
GFR, EST NON AFRICAN AMERICAN: 52 mL/min — AB (ref >60)
Glucose, Bld: 201 mg/dL — ABNORMAL HIGH (ref 65–99)
POTASSIUM: 3.5 mmol/L (ref 3.5–5.1)
Sodium: 139 mmol/L (ref 135–145)
TOTAL PROTEIN: 7.6 g/dL (ref 6.5–8.1)

## 2015-07-12 LAB — CBC WITH DIFFERENTIAL/PLATELET
BASOS ABS: 0 10*3/uL (ref 0.0–0.1)
BASOS PCT: 0 %
EOS PCT: 0 %
Eosinophils Absolute: 0 10*3/uL (ref 0.0–0.7)
HEMATOCRIT: 43.3 % (ref 36.0–46.0)
Hemoglobin: 14.7 g/dL (ref 12.0–15.0)
LYMPHS PCT: 3 %
Lymphs Abs: 0.4 10*3/uL — ABNORMAL LOW (ref 0.7–4.0)
MCH: 28.3 pg (ref 26.0–34.0)
MCHC: 33.9 g/dL (ref 30.0–36.0)
MCV: 83.3 fL (ref 78.0–100.0)
MONO ABS: 0.4 10*3/uL (ref 0.1–1.0)
MONOS PCT: 3 %
Neutro Abs: 10.2 10*3/uL — ABNORMAL HIGH (ref 1.7–7.7)
Neutrophils Relative %: 94 %
PLATELETS: 232 10*3/uL (ref 150–400)
RBC: 5.2 MIL/uL — ABNORMAL HIGH (ref 3.87–5.11)
RDW: 13.8 % (ref 11.5–15.5)
WBC: 10.9 10*3/uL — ABNORMAL HIGH (ref 4.0–10.5)

## 2015-07-12 LAB — LIPASE, BLOOD: LIPASE: 22 U/L (ref 11–51)

## 2015-07-12 LAB — URINE MICROSCOPIC-ADD ON

## 2015-07-12 MED ORDER — MORPHINE SULFATE (PF) 4 MG/ML IV SOLN
4.0000 mg | Freq: Once | INTRAVENOUS | Status: AC
  Administered 2015-07-12: 4 mg via INTRAVENOUS
  Filled 2015-07-12: qty 1

## 2015-07-12 MED ORDER — ONDANSETRON 8 MG PO TBDP: ORAL_TABLET | ORAL | Status: DC

## 2015-07-12 MED ORDER — HYDROCODONE-ACETAMINOPHEN 5-325 MG PO TABS: 1.0000 | ORAL_TABLET | Freq: Four times a day (QID) | ORAL | Status: DC | PRN

## 2015-07-12 MED ORDER — IOHEXOL 300 MG/ML  SOLN
100.0000 mL | Freq: Once | INTRAMUSCULAR | Status: AC | PRN
  Administered 2015-07-12: 100 mL via INTRAVENOUS

## 2015-07-12 MED ORDER — ONDANSETRON HCL 4 MG/2ML IJ SOLN
4.0000 mg | Freq: Once | INTRAMUSCULAR | Status: AC
  Administered 2015-07-12: 4 mg via INTRAVENOUS
  Filled 2015-07-12: qty 2

## 2015-07-12 MED ORDER — IOHEXOL 300 MG/ML  SOLN
50.0000 mL | Freq: Once | INTRAMUSCULAR | Status: AC | PRN
  Administered 2015-07-12: 50 mL via ORAL

## 2015-07-12 MED ORDER — SODIUM CHLORIDE 0.9 % IV BOLUS (SEPSIS)
1000.0000 mL | Freq: Once | INTRAVENOUS | Status: AC
  Administered 2015-07-12: 1000 mL via INTRAVENOUS

## 2015-07-12 NOTE — ED Notes (Signed)
MD at bedside. 

## 2015-07-12 NOTE — ED Provider Notes (Signed)
CSN: [id number]     Arrival date & time 07/12/15  1829 History  By signing my name below, I, Doran Stabler, attest that this documentation has been prepared under the direction and in the presence of Veryl Speak, MD. Electronically Signed: Doran Stabler, ED Scribe. 07/12/2015. 7:26 PM.   Chief Complaint  Patient presents with  . Emesis   The history is provided by the patient. No language interpreter was used.   HPI Comments: Karen Scott is a 60 y.o. female with a PMHx of diverticulitis, HTN, DM, HLD, CAD, GERD, DVT, and CVA who presents to the Emergency Department complaining of constant vomiting that began 3 AM this morning. Pt also reports constant "stabbing" left sided abdominal pain, HA and diarrhea throughout the day today. Pt thinks her abdominal pain is similar to her pain associated with diverticulitis. Pt denies any blood in stools or any other symptoms at this time. Pt denies any sick contacts. Pt last urinated PTA.   Past Medical History  Diagnosis Date  . Stroke ([place])   . Hypertension   . Diabetes mellitus without complication ([place])   . Hyperlipidemia   . CAD (coronary artery disease)   . Hepatic steatosis   . Diverticulosis   . GERD (gastroesophageal reflux disease)   . DVT (deep venous thrombosis) ([place])   . History of CVA (cerebrovascular accident)   . Abdominal wall hernia   . Esophageal stricture   . Carpal tunnel syndrome   . Ovarian cyst   . Adenomatous colon polyp   . Shingles   . Anxiety   . Internal hemorrhoids    Past Surgical History  Procedure Laterality Date  . Cholecystectomy    . Breast surgery      biopsy  . Ankle surgery      left ankle ligament repair  . Joint replacement  2008   Family History  Problem Relation Age of Onset  . Diabetes Mother   . Hyperlipidemia Mother   . Hypertension Mother   . Stroke Mother   . Lung cancer Father   . Hearing loss Maternal Grandmother   . Hearing loss Paternal Grandmother   . Hearing loss  Paternal Grandfather    Social History  Substance Use Topics  . Smoking status: Never Smoker   . Smokeless tobacco: Never Used  . Alcohol Use: No     Comment: socially   OB History    No data available     Review of Systems  Gastrointestinal: Positive for vomiting, abdominal pain and diarrhea. Negative for blood in stool.  Neurological: Positive for headaches.  All other systems reviewed and are negative.  Allergies  Erythromycin and Penicillins  Home Medications   Prior to Admission medications   Medication Sig Start Date End Date Taking? Authorizing Provider  therapeutic multivitamin-minerals [facility]) tablet Take 1 tablet by mouth daily.   Yes Historical Provider, MD  aspirin 81 MG chewable tablet Chew 81 mg by mouth every morning.    Historical Provider, MD  atorvastatin (LIPITOR) 20 MG tablet Take 20 mg by mouth daily.    Historical Provider, MD  dexlansoprazole (DEXILANT) 60 MG capsule Take 1 capsule (60 mg total) by mouth daily. 05/12/15   Jerene Bears, MD  losartan-hydrochlorothiazide (HYZAAR) 100-25 MG per tablet Take 1 tablet by mouth daily.    Historical Provider, MD  metFORMIN (GLUCOPHAGE-XR) 500 MG 24 hr tablet Take 500 mg by mouth 2 (two) times daily.    Historical Provider, MD   BP  123/82 mmHg  Pulse 120  Temp(Src) 98.3 F (36.8 C) (Oral)  Resp 18  Ht 5\' 3"  (1.6 m)  Wt 164 lb (74.39 kg)  BMI 29.06 kg/m2  SpO2 98%   Physical Exam  Constitutional: She is oriented to person, place, and time. She appears well-developed and well-nourished. No distress.  HENT:  Head: Normocephalic and atraumatic.  Mouth/Throat: Oropharynx is clear and moist. No oropharyngeal exudate.  Eyes: Conjunctivae and EOM are normal. Pupils are equal, round, and reactive to light.  Neck: Normal range of motion. Neck supple.  No meningismus.  Cardiovascular: Normal rate, regular rhythm, normal heart sounds and intact distal pulses.   No murmur heard. Pulmonary/Chest: Effort normal  and breath sounds normal. No respiratory distress.  Abdominal: Soft. Bowel sounds are normal. There is no tenderness. There is no rebound and no guarding.  TTP of LUQ and LLQ   Musculoskeletal: Normal range of motion. She exhibits no edema or tenderness.  Neurological: She is alert and oriented to person, place, and time. No cranial nerve deficit. She exhibits normal muscle tone. Coordination normal.  No ataxia on finger to nose bilaterally. No pronator drift. 5/5 strength throughout. CN 2-12 intact.Equal grip strength. Sensation intact.   Skin: Skin is warm.  Psychiatric: She has a normal mood and affect. Her behavior is normal.  Nursing note and vitals reviewed.   ED Course  Procedures   DIAGNOSTIC STUDIES: Oxygen Saturation is 98% on room air, normal by my interpretation.    COORDINATION OF CARE: 7:21 PM Discussed treatment plan with pt at bedside and pt agreed to plan.  Labs Review Labs Reviewed - No data to display  Imaging Review No results found. I have personally reviewed and evaluated these images and lab results as part of my medical decision-making.   EKG Interpretation None      MDM   Final diagnoses:  None    Patient presents with complaints of abdominal pain, vomiting, and diarrhea since last night. She is tender to palpation in the left lower quadrant and left upper quadrant. She has a history of diverticulitis, however is uncertain as to whether this feels similar. She has a slight white count, however CT scan is unremarkable for diverticulitis or other acute process. It does reveal findings consistent with diarrhea. This appears to be a viral gastroenteritis. This will be treated with pain medication, nausea medication, and when necessary return.  I personally performed the services described in this documentation, which was scribed in my presence. The recorded information has been reviewed and is accurate.       Veryl Speak, MD 07/12/15 [phone]

## 2015-07-12 NOTE — ED Notes (Signed)
In CT

## 2015-07-12 NOTE — Discharge Instructions (Signed)
Hydrocodone as prescribed as needed for pain.  Zofran as prescribed as needed for nausea.  Return to the emergency department if you develop severe abdominal pain, bloody stools, or other new and concerning symptoms.

## 2015-07-12 NOTE — ED Notes (Signed)
Pt reports sudden onset of "projectile vomiting" at 4am which has continued all day. Pt started with liquid stools around 3pm. Pt has tried tylenol and pepto otc, "but it comes right back up."

## 2015-07-22 ENCOUNTER — Encounter: Payer: PRIVATE HEALTH INSURANCE | Admitting: Internal Medicine

## 2015-08-03 ENCOUNTER — Telehealth: Payer: Self-pay | Admitting: Internal Medicine

## 2015-08-03 MED ORDER — NA SULFATE-K SULFATE-MG SULF 17.5-3.13-1.6 GM/177ML PO SOLN: ORAL | Status: DC

## 2015-08-03 NOTE — Telephone Encounter (Signed)
Per [facility], they never received previous script sent in December for patient. Resent Rx.

## 2015-08-06 ENCOUNTER — Encounter: Payer: Self-pay | Admitting: Internal Medicine

## 2015-08-06 ENCOUNTER — Ambulatory Visit (AMBULATORY_SURGERY_CENTER): Payer: PRIVATE HEALTH INSURANCE | Admitting: Internal Medicine

## 2015-08-06 VITALS — BP 120/76 | HR 73 | Temp 98.6°F | Resp 12 | Ht 63.0 in | Wt 168.0 lb

## 2015-08-06 DIAGNOSIS — K219 Gastro-esophageal reflux disease without esophagitis: Secondary | ICD-10-CM | POA: Diagnosis not present

## 2015-08-06 DIAGNOSIS — R1310 Dysphagia, unspecified: Secondary | ICD-10-CM

## 2015-08-06 DIAGNOSIS — K317 Polyp of stomach and duodenum: Secondary | ICD-10-CM

## 2015-08-06 DIAGNOSIS — Z860100 Personal history of colon polyps, unspecified: Secondary | ICD-10-CM

## 2015-08-06 DIAGNOSIS — D122 Benign neoplasm of ascending colon: Secondary | ICD-10-CM | POA: Diagnosis not present

## 2015-08-06 DIAGNOSIS — K3189 Other diseases of stomach and duodenum: Secondary | ICD-10-CM | POA: Diagnosis not present

## 2015-08-06 DIAGNOSIS — R1013 Epigastric pain: Secondary | ICD-10-CM | POA: Diagnosis not present

## 2015-08-06 LAB — GLUCOSE, CAPILLARY
Glucose-Capillary: 121 mg/dL — ABNORMAL HIGH (ref 65–99)
Glucose-Capillary: 89 mg/dL (ref 65–99)

## 2015-08-06 MED ORDER — SODIUM CHLORIDE 0.9 % IV SOLN: 500.0000 mL | INTRAVENOUS | Status: DC

## 2015-08-06 NOTE — Progress Notes (Signed)
Report to PACU, RN, vss, BBS= Clear.  

## 2015-08-06 NOTE — Op Note (Signed)
[facility] Patient Name: Karen Scott Procedure Date: 08/06/2015 2:04 PM MRN: [id number] Endoscopist: Jerene Bears , MD Age: 60 Referring MD:  Date of Birth: [birth year] Gender: Female Procedure:                Upper GI endoscopy Indications:              Dyspepsia, Dysphagia (discussed pre-procedure,                            prior dilation with Dr. Lajoyce Corners proved helpful),                            Gastro-esophageal reflux disease Medicines:                Monitored Anesthesia Care Procedure:                Pre-Anesthesia Assessment:                           - Prior to the procedure, a History and Physical                            was performed, and patient medications and                            allergies were reviewed. The patient's tolerance of                            previous anesthesia was also reviewed. The risks                            and benefits of the procedure and the sedation                            options and risks were discussed with the patient.                            All questions were answered, and informed consent                            was obtained. Prior Anticoagulants: The patient has                            taken no previous anticoagulant or antiplatelet                            agents. ASA Grade Assessment: II - A patient with                            mild systemic disease. After reviewing the risks                            and benefits, the patient was deemed in  satisfactory condition to undergo the procedure.                           After obtaining informed consent, the endoscope was                            passed under direct vision. Throughout the                            procedure, the patient's blood pressure, pulse, and                            oxygen saturations were monitored continuously. The                            Model GIF-[id number]) scope was introduced                         through the mouth, and advanced to the second part                            of duodenum. The upper GI endoscopy was                            accomplished without difficulty. The patient                            tolerated the procedure well. Scope In: Scope Out: Findings:      The examined esophagus was normal. Given dysphagia symptoms empiric       dilation was performed. A guidewire was placed and the scope was       withdrawn. Dilation was performed with a Savary dilator with no       resistance at 17 mm.      Localized moderate inflammation characterized by linear erosions was       found in the gastric antrum. Biopsies were taken at antrum, incisura and       body with a cold forceps for Helicobacter pylori testing.      Multiple 3 to 10 mm semi-sessile polyps with no bleeding and no stigmata       of recent bleeding were found in the cardia, in the gastric fundus and       in the gastric body. Multiple sampling biopsies were obtained with cold       forceps for histology in the gastric fundus and in the gastric body       (likely fundic gland polyps).      The examined duodenum was normal. Complications:            No immediate complications. Estimated Blood Loss:     Estimated blood loss was minimal. Impression:               - Normal esophagus. Dilated to 17 mm with Savary.                           - Gastritis. Biopsied.                           -  Multiple gastric polyps; sampled.                           - Normal examined duodenum. Recommendation:           - Patient has a contact number available for                            emergencies. The signs and symptoms of potential                            delayed complications were discussed with the                            patient. Return to normal activities tomorrow.                            Written discharge instructions were provided to the                            patient.                            - Resume previous diet.                           - Continue present medications.                           - Await pathology results. Procedure Code(s):        --- Professional ---                           [phone], Esophagogastroduodenoscopy, flexible,                            transoral; with insertion of guide wire followed by                            passage of dilator(s) through esophagus over guide                            wire                           [id number], Esophagogastroduodenoscopy, flexible,                            transoral; with biopsy, single or multiple CPT copyright 2016 American [employer] Association. All rights reserved. Lajuan Lines. Hilarie Fredrickson, MD Jerene Bears, MD 08/06/2015 3:00:42 PM This report has been signed electronically. Number of Addenda: 0

## 2015-08-06 NOTE — Patient Instructions (Addendum)

## 2015-08-06 NOTE — Op Note (Signed)
[facility] Patient Name: Karen Scott Procedure Date: 08/06/2015 2:04 PM MRN: [id number] Endoscopist: Jerene Bears , MD Age: 60 Referring MD:  Date of Birth: 1955-06-18 Gender: Female Procedure:                Colonoscopy Indications:              Surveillance: Personal history of adenomatous                            polyps on last colonoscopy 5 years ago Medicines:                Monitored Anesthesia Care Procedure:                Pre-Anesthesia Assessment:                           - Prior to the procedure, a History and Physical                            was performed, and patient medications and                            allergies were reviewed. The patient's tolerance of                            previous anesthesia was also reviewed. The risks                            and benefits of the procedure and the sedation                            options and risks were discussed with the patient.                            All questions were answered, and informed consent                            was obtained. Prior Anticoagulants: The patient has                            taken no previous anticoagulant or antiplatelet                            agents. ASA Grade Assessment: II - A patient with                            mild systemic disease. After reviewing the risks                            and benefits, the patient was deemed in                            satisfactory condition to undergo the procedure.  After obtaining informed consent, the colonoscope                            was passed under direct vision. Throughout the                            procedure, the patient's blood pressure, pulse, and                            oxygen saturations were monitored continuously. The                            Model [id number]-[id number]) scope was introduced                            through the anus and advanced to the the cecum,                            identified by appendiceal orifice and ileocecal                            valve. The colonoscopy was performed without                            difficulty. The patient tolerated the procedure                            well. The quality of the bowel preparation was                            good. The ileocecal valve, appendiceal orifice, and                            rectum were photographed. Scope In: 2:30:43 PM Scope Out: 2:41:17 PM Scope Withdrawal Time: 0 hours 8 minutes 38 seconds  Total Procedure Duration: 0 hours 10 minutes 34 seconds  Findings:      The perianal exam findings include skin tags.      A 5 mm polyp was found in the ascending colon. The polyp was sessile.       The polyp was removed with a cold snare. Resection and retrieval were       complete.      Multiple small and large-mouthed diverticula were found in the left       colon.      Internal hemorrhoids were found during retroflexion. The hemorrhoids       were medium-sized. Complications:            No immediate complications. Estimated Blood Loss:     Estimated blood loss was minimal. Impression:               - Perianal skin tags found on perianal exam.                           - One 5 mm polyp in the ascending colon, removed  with a cold snare. Resected and retrieved.                           - Severe diverticulosis in the left colon.                           - Internal hemorrhoids. Recommendation:           - Patient has a contact number available for                            emergencies. The signs and symptoms of potential                            delayed complications were discussed with the                            patient. Return to normal activities tomorrow.                            Written discharge instructions were provided to the                            patient.                           - Resume previous diet.                            - Continue present medications.                           - Await pathology results.                           - Repeat colonoscopy is recommended for adenoma                            surveillance. The colonoscopy date will be                            determined after pathology results from today's                            exam become available for review. Procedure Code(s):        --- Professional ---                           [phone], Colonoscopy, flexible; with removal of                            tumor(s), polyp(s), or other lesion(s) by snare                            technique CPT copyright 2016 American Medical Association. All rights reserved. Lajuan Lines. Hilarie Fredrickson, MD Jerene Bears, MD 08/06/2015 3:05:19 PM This report  has been signed electronically. Number of Addenda: 0

## 2015-08-06 NOTE — Progress Notes (Signed)
Called to room to assist during endoscopic procedure.  Patient ID and intended procedure confirmed with present staff. Received instructions for my participation in the procedure from the performing physician.  

## 2015-08-09 ENCOUNTER — Telehealth: Payer: Self-pay

## 2015-08-09 NOTE — Telephone Encounter (Signed)
Left message on answering machine. 

## 2015-08-11 ENCOUNTER — Encounter: Payer: Self-pay | Admitting: Internal Medicine

## 2016-08-18 ENCOUNTER — Emergency Department (HOSPITAL_COMMUNITY): Payer: Self-pay

## 2016-08-18 ENCOUNTER — Emergency Department (HOSPITAL_COMMUNITY)
Admission: EM | Admit: 2016-08-18 | Discharge: 2016-08-19 | Disposition: A | Discharge status: Home / Self Care | Payer: Self-pay | Attending: Emergency Medicine | Admitting: Emergency Medicine

## 2016-08-18 ENCOUNTER — Encounter (HOSPITAL_COMMUNITY): Payer: Self-pay | Admitting: Oncology

## 2016-08-18 DIAGNOSIS — Z7984 Long term (current) use of oral hypoglycemic drugs: Secondary | ICD-10-CM | POA: Insufficient documentation

## 2016-08-18 DIAGNOSIS — Z5181 Encounter for therapeutic drug level monitoring: Secondary | ICD-10-CM | POA: Insufficient documentation

## 2016-08-18 DIAGNOSIS — G43809 Other migraine, not intractable, without status migrainosus: Secondary | ICD-10-CM | POA: Insufficient documentation

## 2016-08-18 DIAGNOSIS — Z9660 Presence of unspecified orthopedic joint implant: Secondary | ICD-10-CM | POA: Insufficient documentation

## 2016-08-18 DIAGNOSIS — I10 Essential (primary) hypertension: Secondary | ICD-10-CM | POA: Insufficient documentation

## 2016-08-18 DIAGNOSIS — Z8673 Personal history of transient ischemic attack (TIA), and cerebral infarction without residual deficits: Secondary | ICD-10-CM | POA: Insufficient documentation

## 2016-08-18 DIAGNOSIS — G43109 Migraine with aura, not intractable, without status migrainosus: Secondary | ICD-10-CM

## 2016-08-18 DIAGNOSIS — Z7982 Long term (current) use of aspirin: Secondary | ICD-10-CM | POA: Insufficient documentation

## 2016-08-18 DIAGNOSIS — I2510 Atherosclerotic heart disease of native coronary artery without angina pectoris: Secondary | ICD-10-CM | POA: Insufficient documentation

## 2016-08-18 DIAGNOSIS — Z79899 Other long term (current) drug therapy: Secondary | ICD-10-CM | POA: Insufficient documentation

## 2016-08-18 DIAGNOSIS — E119 Type 2 diabetes mellitus without complications: Secondary | ICD-10-CM | POA: Insufficient documentation

## 2016-08-18 LAB — COMPREHENSIVE METABOLIC PANEL
ALT: 20 U/L (ref 14–54)
ANION GAP: 7 (ref 5–15)
AST: 23 U/L (ref 15–41)
Albumin: 4.1 g/dL (ref 3.5–5.0)
Alkaline Phosphatase: 63 U/L (ref 38–126)
BUN: 18 mg/dL (ref 6–20)
CHLORIDE: 102 mmol/L (ref 101–111)
CO2: 28 mmol/L (ref 22–32)
Calcium: 9.5 mg/dL (ref 8.9–10.3)
Creatinine, Ser: 0.98 mg/dL (ref 0.44–1.00)
Glucose, Bld: 157 mg/dL — ABNORMAL HIGH (ref 65–99)
Potassium: 3.2 mmol/L — ABNORMAL LOW (ref 3.5–5.1)
SODIUM: 137 mmol/L (ref 135–145)
Total Bilirubin: 0.5 mg/dL (ref 0.3–1.2)
Total Protein: 7.4 g/dL (ref 6.5–8.1)

## 2016-08-18 LAB — CBC
HCT: 39.6 % (ref 36.0–46.0)
Hemoglobin: 13.4 g/dL (ref 12.0–15.0)
MCH: 28.3 pg (ref 26.0–34.0)
MCHC: 33.8 g/dL (ref 30.0–36.0)
MCV: 83.5 fL (ref 78.0–100.0)
PLATELETS: 229 10*3/uL (ref 150–400)
RBC: 4.74 MIL/uL (ref 3.87–5.11)
RDW: 13.3 % (ref 11.5–15.5)
WBC: 8.3 10*3/uL (ref 4.0–10.5)

## 2016-08-18 LAB — DIFFERENTIAL
BASOS PCT: 0 %
Basophils Absolute: 0 10*3/uL (ref 0.0–0.1)
EOS ABS: 0.1 10*3/uL (ref 0.0–0.7)
EOS PCT: 2 %
Lymphocytes Relative: 35 %
Lymphs Abs: 2.9 10*3/uL (ref 0.7–4.0)
MONO ABS: 0.6 10*3/uL (ref 0.1–1.0)
Monocytes Relative: 8 %
Neutro Abs: 4.6 10*3/uL (ref 1.7–7.7)
Neutrophils Relative %: 55 %

## 2016-08-18 LAB — CBG MONITORING, ED: GLUCOSE-CAPILLARY: 148 mg/dL — AB (ref 65–99)

## 2016-08-18 LAB — APTT: aPTT: 29 seconds (ref 24–36)

## 2016-08-18 LAB — PROTIME-INR
INR: 0.92
PROTHROMBIN TIME: 12.3 s (ref 11.4–15.2)

## 2016-08-18 LAB — I-STAT TROPONIN, ED: TROPONIN I, POC: 0 ng/mL (ref 0.00–0.08)

## 2016-08-18 LAB — SEDIMENTATION RATE: Sed Rate: 6 mm/hr (ref 0–22)

## 2016-08-18 MED ORDER — DIPHENHYDRAMINE HCL 50 MG/ML IJ SOLN
25.0000 mg | Freq: Once | INTRAMUSCULAR | Status: AC
  Administered 2016-08-18: 25 mg via INTRAVENOUS
  Filled 2016-08-18: qty 1

## 2016-08-18 MED ORDER — KETOROLAC TROMETHAMINE 30 MG/ML IJ SOLN
15.0000 mg | Freq: Once | INTRAMUSCULAR | Status: AC
  Administered 2016-08-18: 15 mg via INTRAVENOUS
  Filled 2016-08-18: qty 1

## 2016-08-18 MED ORDER — SODIUM CHLORIDE 0.9 % IV BOLUS (SEPSIS)
500.0000 mL | Freq: Once | INTRAVENOUS | Status: AC
  Administered 2016-08-18: 500 mL via INTRAVENOUS

## 2016-08-18 MED ORDER — PROMETHAZINE HCL 25 MG/ML IJ SOLN
25.0000 mg | Freq: Once | INTRAMUSCULAR | Status: AC
  Administered 2016-08-18: 25 mg via INTRAVENOUS
  Filled 2016-08-18: qty 1

## 2016-08-18 NOTE — Discharge Instructions (Signed)
Follow-up with either [facility] or [facility] neurology.

## 2016-08-18 NOTE — ED Notes (Signed)
Patient transported to CT 

## 2016-08-18 NOTE — ED Notes (Signed)
CBG 148 

## 2016-08-18 NOTE — ED Triage Notes (Addendum)
Pt reports that she developed sided facial numbness that has not subsided.  Pt states that this has happened one other time in the past and was evaluated w/ no evidence of CVA or TIA.  No facial asymmetry, no slurred speech.  Sensation is decreased on left side of face.  Mild difference is left sided grip. Pt is also c/o left sided HA. Pain behind left eye.  Pt rates HA 5/10, aching in nature  Pt informed this writer that she had an MRI that did show changes consistent w/ CVA however could not determine when it had happened.

## 2016-08-18 NOTE — ED Provider Notes (Signed)
[facility] DEPT Provider Note   CSN: [id number] Arrival date & time: 08/18/16  2015     History   Chief Complaint Chief Complaint  Patient presents with  . Numbness    HPI Karen Scott is a 61 y.o. female.  HPI Patient presents with left-sided facial numbness. It began last evening. States it improved a little last night but still this morning. Also has left-sided headache behind the eye. Pain is dull. Also has some weakness on the left side and some change in sensation on that side also. Has had episodes like this before. Has had at least 2 other episodes one of which was in [place]. One of which was seen at [facility]. States she was told she had an old stroke but did not have a stroke at that time. He states the vision is a little worse on the left side.   Past Medical History:  Diagnosis Date  . Abdominal wall hernia   . Adenomatous colon polyp   . Anxiety   . CAD (coronary artery disease)   . Carpal tunnel syndrome   . Diabetes mellitus without complication ([place])   . Diverticulosis   . DVT (deep venous thrombosis) ([place])   . Esophageal stricture   . GERD (gastroesophageal reflux disease)   . Hepatic steatosis   . History of CVA (cerebrovascular accident)   . Hyperlipidemia   . Hypertension   . Internal hemorrhoids   . Ovarian cyst   . Shingles   . Stroke [facility])     Patient Active Problem List   Diagnosis Date Noted  . HTN (hypertension) 10/12/2013  . Type II or unspecified type diabetes mellitus without mention of complication, not stated as uncontrolled 10/12/2013  . History of CVA (cerebrovascular accident) 10/12/2013    Past Surgical History:  Procedure Laterality Date  . ANKLE SURGERY     left ankle ligament repair  . BREAST SURGERY     biopsy  . CHOLECYSTECTOMY    . JOINT REPLACEMENT  2008    OB History    No data available       Home Medications    Prior to Admission medications   Medication Sig Start Date End Date Taking?  Authorizing Provider  aspirin 81 MG chewable tablet Chew 81 mg by mouth every morning.   Yes Historical Provider, MD  atorvastatin (LIPITOR) 20 MG tablet Take 20 mg by mouth every evening.    Yes Historical Provider, MD  losartan-hydrochlorothiazide (HYZAAR) 100-25 MG per tablet Take 1 tablet by mouth every morning.    Yes Historical Provider, MD  metFORMIN (GLUCOPHAGE-XR) 500 MG 24 hr tablet Take 500 mg by mouth 2 (two) times daily.   Yes Historical Provider, MD  therapeutic multivitamin-minerals (THERAGRAN-M) tablet Take 1 tablet by mouth daily.   Yes Historical Provider, MD  dexlansoprazole (DEXILANT) 60 MG capsule Take 1 capsule (60 mg total) by mouth daily. Patient not taking: Reported on 08/06/2015 05/12/15   Jerene Bears, MD  HYDROcodone-acetaminophen (NORCO) 5-325 MG tablet Take 1-2 tablets by mouth every 6 (six) hours as needed. Patient not taking: Reported on 08/06/2015 07/12/15   Veryl Speak, MD    Family History Family History  Problem Relation Age of Onset  . Diabetes Mother   . Hyperlipidemia Mother   . Hypertension Mother   . Stroke Mother   . Lung cancer Father   . Hearing loss Maternal Grandmother   . Hearing loss Paternal Grandmother   . Hearing loss  Paternal Grandfather     Social History Social History  Substance Use Topics  . Smoking status: Never Smoker  . Smokeless tobacco: Never Used  . Alcohol use No     Comment: socially     Allergies   Erythromycin and Penicillins   Review of Systems Review of Systems  Constitutional: Negative for appetite change.  HENT: Negative for congestion.   Eyes: Positive for visual disturbance.  Respiratory: Negative for chest tightness and shortness of breath.   Gastrointestinal: Negative for abdominal distention.  Endocrine: Negative for polyuria.  Genitourinary: Negative for flank pain.  Musculoskeletal: Negative for back pain.  Skin: Negative for pallor and wound.  Neurological: Positive for weakness, numbness and  headaches.  Psychiatric/Behavioral: Negative for confusion.     Physical Exam Updated Vital Signs BP 134/78 (BP Location: Left Arm)   Pulse 81   Temp 98 F (36.7 C) (Oral)   Resp 13   Ht 5' 2.5" (1.588 m)   Wt 168 lb 9.6 oz (76.5 kg)   SpO2 96%   BMI 30.35 kg/m   Physical Exam  Constitutional: She is oriented to person, place, and time. She appears well-developed and well-nourished.  HENT:  Head: Atraumatic.  Eyes: EOM are normal. Pupils are equal, round, and reactive to light.  Neck: Neck supple.  Cardiovascular: Normal rate.   Pulmonary/Chest: Effort normal.  Abdominal: Soft. Bowel sounds are normal.  Musculoskeletal: She exhibits no edema.  Neurological: She is alert and oriented to person, place, and time.  Extraocular movements intact. Pupils equal round reactive to light. Visual fields grossly intact by confrontation. Moves all extremities equally. Slightly decreased grip strength on left side compared to right. Paresthesia or slightly decreased sensation to left face and upper lower and mid fields. Also on left chest and upper extremity. Splits the midline. Normal sensation lower extremities. Finger to nose intact bilaterally. Awake and appropriate.  Skin: Skin is warm. Capillary refill takes less than 2 seconds.  Psychiatric: She has a normal mood and affect.     ED Treatments / Results  Labs (all labs ordered are listed, but only abnormal results are displayed) Labs Reviewed  COMPREHENSIVE METABOLIC PANEL - Abnormal; Notable for the following:       Result Value   Potassium 3.2 (*)    Glucose, Bld 157 (*)    All other components within normal limits  CBG MONITORING, ED - Abnormal; Notable for the following:    Glucose-Capillary 148 (*)    All other components within normal limits  PROTIME-INR  APTT  CBC  DIFFERENTIAL  SEDIMENTATION RATE  I-STAT TROPOININ, ED  I-STAT CHEM 8, ED    EKG  EKG Interpretation  Date/Time:  Friday August 18 2016 20:40:52  EDT Ventricular Rate:  85 PR Interval:    QRS Duration: 98 QT Interval:  390 QTC Calculation: 464 R Axis:   -45 Text Interpretation:  Sinus rhythm Inferior infarct, old Consider anterior infarct Confirmed by Randi College  MD, Chereese Cilento [phone]) on 08/18/2016 11:30:28 PM       Radiology Ct Head Wo Contrast  Result Date: 08/18/2016 CLINICAL DATA:  61 year old female with left side headache, decreased sensation of the left face, decreased left grip strength. Initial encounter. EXAM: CT HEAD WITHOUT CONTRAST TECHNIQUE: Contiguous axial images were obtained from the base of the skull through the vertex without intravenous contrast. COMPARISON:  Brain MRI and head CT without contrast 11/01/2012. FINDINGS: Brain: Stable cerebral volume since 2014, within normal limits for age. 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 No midline shift, ventriculomegaly, mass effect, evidence of mass lesion, intracranial hemorrhage or evidence of cortically based acute infarction. No cortical encephalomalacia identified. Vascular: No suspicious intracranial vascular hyperdensity. Skull: Negative.  No acute osseous abnormality identified. Sinuses/Orbits: Visualized paranasal sinuses and mastoids are stable and well pneumatized. Other: Visualized orbits and scalp soft tissues are within normal limits. IMPRESSION: Stable non contrast CT appearance of the brain, negative aside from chronic small right thalamic lacunar infarct. Electronically Signed   By: Genevie Ann M.D.   On: 08/18/2016 21:43    Procedures Procedures (including critical care time)  Medications Ordered in ED Medications  promethazine (PHENERGAN) injection 25 mg (25 mg Intravenous Given 08/18/16 2207)  ketorolac (TORADOL) 30 MG/ML injection 15 mg (15 mg Intravenous Given 08/18/16 2212)  sodium chloride 0.9 % bolus 500 mL (0 mLs Intravenous Stopped 08/18/16 2244)  diphenhydrAMINE (BENADRYL) injection 25 mg (25 mg Intravenous Given 08/18/16 2243)      Initial Impression / Assessment and Plan / ED Course  I have reviewed the triage vital signs and the nursing notes.  Pertinent labs & imaging results that were available during my care of the patient were reviewed by me and considered in my medical decision making (see chart for details).     Patient with headache and left-sided findings. Has had 2 previous episodes of the same. Has been worked up previously. 2 years ago had similar episode with negative MRI for infarct at that time. Symptoms improved somewhat with migraine treatment. I think it's likely, migraine. She did however have a somewhat akathisia versus dystonic reaction to Phenergan. To become little more anxious and started pacing the room a little bit. States tongue felt a little strange also. Given Benadryl. With previous workup I think this point she does not need a stroke workup in the hospital. Will have follow-up with neurology. Discharge home. Acute stroke felt less likely. Also not a TPA candidate due to time of onset of last night.  Final Clinical Impressions(s) / ED Diagnoses   Final diagnoses:  Complicated migraine    New Prescriptions New Prescriptions   No medications on file     Davonna Belling, MD 08/18/16 2332

## 2016-08-18 NOTE — ED Notes (Signed)
Pt c/o new onset restlessness. Alvino Chapel, MD notified. Informed to give benadryl IV.

## 2016-09-12 ENCOUNTER — Ambulatory Visit: Payer: Managed Care, Other (non HMO) | Admitting: Neurology

## 2016-09-26 ENCOUNTER — Ambulatory Visit (INDEPENDENT_AMBULATORY_CARE_PROVIDER_SITE_OTHER): Payer: PRIVATE HEALTH INSURANCE | Admitting: Neurology

## 2016-09-26 ENCOUNTER — Encounter (INDEPENDENT_AMBULATORY_CARE_PROVIDER_SITE_OTHER): Payer: Self-pay

## 2016-09-26 ENCOUNTER — Encounter: Payer: Self-pay | Admitting: Neurology

## 2016-09-26 VITALS — BP 143/93 | HR 76 | Ht 63.0 in | Wt 165.0 lb

## 2016-09-26 DIAGNOSIS — G43009 Migraine without aura, not intractable, without status migrainosus: Secondary | ICD-10-CM

## 2016-09-26 DIAGNOSIS — G43409 Hemiplegic migraine, not intractable, without status migrainosus: Secondary | ICD-10-CM | POA: Diagnosis not present

## 2016-09-26 DIAGNOSIS — G43109 Migraine with aura, not intractable, without status migrainosus: Secondary | ICD-10-CM

## 2016-09-26 MED ORDER — FLUOXETINE HCL 20 MG PO CAPS: 20.0000 mg | ORAL_CAPSULE | Freq: Every day | ORAL | 6 refills | Status: DC

## 2016-09-26 MED ORDER — SUMATRIPTAN SUCCINATE 100 MG PO TABS: 100.0000 mg | ORAL_TABLET | Freq: Once | ORAL | 12 refills | Status: DC | PRN

## 2016-09-26 NOTE — Progress Notes (Signed)
[facility]    Provider:  Dr Jaynee Eagles Referring Provider: ED Primary Care Physician:  Horatio Pel, MD  CC:  Migraine associated with left sided sensory changes and weakness.  HPI:  Karen Scott is a 61 y.o. female here as a referral from ED for Migraine associated with left sided sensory changes and weakness. . She has a hx of migraines since a teenager. She has a past medical history of hypertension, diabetes, hyperlipidemia, coronary artery disease. She's had several episodes of left sided symptoms initially thought to be CVA and now diagnosed as "complicated migraine". In 2014 the sun was glistening of the water and she was getting a headache and her face started twitching, they spent one day in [place] in the ED. On the CAT scan they may have seen a stroke in the past but subsequent workup with MRI was negative. In 2016 she had another episode that was similar. A few weeks ago had another episode and her face got numb and lips tingling. The next day symptoms were worse and she went to the ED. She had a paradoxical rxn to Phenergan. The headaches on the left side, throbbing, behind the left eye, painful, her face will go numb and her face will twitch with left-sided weakness. No aura. No medication overuse. Endorses nausea no vomiting. Stressful events are triggers. Dark room and quiet makes it better. These have been some stressful events. Mother with migraines. She has half the month of headaches(15/30) and. 8-9 are migrainous in the setting of recent stress. No other focal neurologic deficits, associated symptoms, inciting events or modifiable factors. No aura.     Reviewed notes, labs and imaging from outside physicians, which showed:  Reviewed emergency room visits. Patient was seen on March 23 presented with left-sided facial numbness. There was persistent since the previous night also left sided headache behind the eye with a dull pain. She also reported some  weakness in the left side and some changes in sensation. Has had episodes like this before. Review of exam showed that there was slightly decreased grip strength on the left compared to the right. There is also some slightly decreased in the left face and upper lower and mid fields. She did split in the midline. Otherwise exam unremarkable. Patient has similar symptoms 2 years previous and was worked up, negative MRI for infarct at that time. Her headaches improved with migraine treatment. She did have echo this he had versus dystonic reaction to Phenergan, became anxious and started pacing the room. Given Benadryl. Reviewed all past emergency room visits, significant for a visit in 2014 with similar symptoms left facial dysesthesias, visual changes in the setting of migraine with intermittent left eye visual blurriness, posterior left head pain. At that time patient said that she had a similar symptom a year previous and told she had a possible stroke however CT scan was negative and subsequent follow-up with neurology was inconclusive (so would not say that stroke is a diagnosis of this patient). Imaging at that time was unremarkable and she improved with migraine cocktail/migraine treatment.  Personally reviewed MRI images from June 2014 which showed normal for age brain.  Reviewed labs. CMP was remarkable only for elevated glucose with normal BUN and creatinine. CBC was normal. Labs were drawn during last emergency room visit 08/18/2016.  Review of Systems: Patient complains of symptoms per HPI as well as the following symptoms: no CP, no SOB. Pertinent negatives per HPI. All others negative.   Social History  Social History  . Marital status: Married    Spouse name: N/A  . Number of children: N/A  . Years of education: N/A   Occupational History  . [employer]    Social History Main Topics  . Smoking status: Never Smoker  . Smokeless tobacco: Never Used  . Alcohol use No       Comment: socially  . Drug use: No  . Sexual activity: Not on file   Other Topics Concern  . Not on file   Social History Narrative  . No narrative on file    Family History  Problem Relation Age of Onset  . Diabetes Mother   . Hyperlipidemia Mother   . Hypertension Mother   . Stroke Mother   . Lung cancer Father   . Hearing loss Maternal Grandmother   . Hearing loss Paternal Grandmother   . Hearing loss Paternal Grandfather     Past Medical History:  Diagnosis Date  . Abdominal wall hernia   . Adenomatous colon polyp   . Anxiety   . CAD (coronary artery disease)   . Carpal tunnel syndrome   . Diabetes mellitus without complication ([place])   . Diverticulosis   . DVT (deep venous thrombosis) ([place])   . Esophageal stricture   . GERD (gastroesophageal reflux disease)   . Hepatic steatosis   . Hyperlipidemia   . Hypertension   . Internal hemorrhoids   . Ovarian cyst   . Shingles     Past Surgical History:  Procedure Laterality Date  . ANKLE SURGERY     left ankle ligament repair  . BREAST SURGERY     biopsy  . CHOLECYSTECTOMY    . JOINT REPLACEMENT  2008    Current Outpatient Prescriptions  Medication Sig Dispense Refill  . aspirin 81 MG chewable tablet Chew 81 mg by mouth every morning.    Marland Kitchen atorvastatin (LIPITOR) 20 MG tablet Take 20 mg by mouth every evening.     Marland Kitchen losartan-hydrochlorothiazide (HYZAAR) 100-25 MG per tablet Take 1 tablet by mouth every morning.     . metFORMIN (GLUCOPHAGE-XR) 500 MG 24 hr tablet Take 500 mg by mouth 2 (two) times daily.    . Omega-3 Fatty Acids (FISH OIL PO) Take by mouth.    . Pantoprazole Sodium (PROTONIX PO) Take by mouth 2 (two) times daily.    Marland Kitchen therapeutic multivitamin-minerals (THERAGRAN-M) tablet Take 1 tablet by mouth daily.    Marland Kitchen FLUoxetine (PROZAC) 20 MG capsule Take 1 capsule (20 mg total) by mouth daily. 30 capsule 6  . SUMAtriptan (IMITREX) 100 MG tablet Take 1 tablet (100 mg total) by mouth once as needed.  May repeat in 2 hours if headache persists or recurs. 10 tablet 12   No current facility-administered medications for this visit.     Allergies as of 09/26/2016 - Review Complete 09/26/2016  Allergen Reaction Noted  . Erythromycin  10/12/2013  . Penicillins  09/07/2012  . Tramadol  09/26/2016    Vitals: BP (!) 143/93   Pulse 76   Ht 5\' 3"  (1.6 m)   Wt 165 lb (74.8 kg)   BMI 29.23 kg/m  Last Weight:  Wt Readings from Last 1 Encounters:  09/26/16 165 lb (74.8 kg)   Last Height:   Ht Readings from Last 1 Encounters:  09/26/16 5\' 3"  (1.6 m)   Physical exam: Exam: Gen: NAD, conversant, well nourised, well groomed  CV: RRR, no MRG. No Carotid Bruits. No peripheral edema, warm, nontender Eyes: Conjunctivae clear without exudates or hemorrhage  Neuro: Detailed Neurologic Exam  Speech:    Speech is normal; fluent and spontaneous with normal comprehension.  Cognition:    The patient is oriented to person, place, and time;     recent and remote memory intact;     language fluent;     normal attention, concentration,     fund of knowledge Cranial Nerves:    The pupils are equal, round, and reactive to light. The fundi are normal and spontaneous venous pulsations are present. Visual fields are full to finger confrontation. Esotropia left eye and impaired upgaze, otherwise Extraocular movements are intact. Trigeminal sensation is intact and the muscles of mastication are normal. The face is symmetric. The palate elevates in the midline. Hearing intact. Voice is normal. Shoulder shrug is normal. The tongue has normal motion without fasciculations.   Coordination:    Normal finger to nose and heel to shin. Normal rapid alternating movements.   Gait:    Heel-toe and tandem gait are normal.   Motor Observation:    No asymmetry, no atrophy, and no involuntary movements noted. Tone:    Normal muscle tone.    Posture:    Posture is normal. normal erect      Strength: Mild left proximal weakness and mild left arm drift. Otherwise strength is V/V in the upper and lower limbs.      Sensation: intact to LT     Reflex Exam:  DTR's:    Deep tendon reflexes in the upper and lower extremities are normal bilaterally.   Toes:    The toes are equivocal bilaterally.   Clonus:    Clonus is absent.       Assessment/Plan:  61 year old patient with migraines associated with left-sided weakness and sensory changes. Workup has been negative to date for stroke or other etiologies and MRI of the brain in the past normal for age.  Start Fluoxetine 20mg  daily which may help with her recent increases in stress as well Keep a headache diary and email it to me Keep me updated via email on progress Imitrex: Please take one tablet at the onset of your headache. If it does not improve the symptoms please take one additional tablet in 2 hours. Do not take more then 2 tablets in 24hrs. Do not take use more then 2 to 3 times in a week.   Discussed: To prevent or relieve headaches, try the following: Cool Compress. Lie down and place a cool compress on your head.  Avoid headache triggers. If certain foods or odors seem to have triggered your migraines in the past, avoid them. A headache diary might help you identify triggers.  Include physical activity in your daily routine. Try a daily walk or other moderate aerobic exercise.  Manage stress. Find healthy ways to cope with the stressors, such as delegating tasks on your to-do list.  Practice relaxation techniques. Try deep breathing, yoga, massage and visualization.  Eat regularly. Eating regularly scheduled meals and maintaining a healthy diet might help prevent headaches. Also, drink plenty of fluids.  Follow a regular sleep schedule. Sleep deprivation might contribute to headaches Consider biofeedback. With this mind-body technique, you learn to control certain bodily functions - such as muscle tension, heart  rate and blood pressure - to prevent headaches or reduce headache pain.    Proceed to emergency room if you experience new or worsening symptoms  or symptoms do not resolve, if you have new neurologic symptoms or if headache is severe, or for any concerning symptom.    Sarina Ill, MD  [facility] [address] [place], [place] [address]  Phone [phone] Fax [phone]

## 2016-09-26 NOTE — Patient Instructions (Addendum)
Remember to drink plenty of fluid, eat healthy meals and do not skip any meals. Try to eat protein with a every meal and eat a healthy snack such as fruit or nuts in between meals. Try to keep a regular sleep-wake schedule and try to exercise daily, particularly in the form of walking, 20-30 minutes a day, if you can.   As far as your medications are concerned, I would like to suggest:  Imitrex: Please take one tablet at the onset of your headache. If it does not improve the symptoms please take one additional tablet in 2 hours. Do not take more then 2 tablets in 24hrs. Do not take use more then 2 to 3 times in a week.  Keep a headache diary  As far as diagnostic testing: If no improvement suggest further workup  I would like to see you back in 3-4 months, sooner if we need to. Please call us with any interim questions, concerns, problems, updates or refill requests.   Our phone number is [phone]. We also have an after hours call service for urgent matters and there is a physician on-call for urgent questions. For any emergencies you know to call 911 or go to the nearest emergency room  To prevent or relieve headaches, try the following: Cool Compress. Lie down and place a cool compress on your head.  Avoid headache triggers. If certain foods or odors seem to have triggered your migraines in the past, avoid them. A headache diary might help you identify triggers.  Include physical activity in your daily routine. Try a daily walk or other moderate aerobic exercise.  Manage stress. Find healthy ways to cope with the stressors, such as delegating tasks on your to-do list.  Practice relaxation techniques. Try deep breathing, yoga, massage and visualization.  Eat regularly. Eating regularly scheduled meals and maintaining a healthy diet might help prevent headaches. Also, drink plenty of fluids.  Follow a regular sleep schedule. Sleep deprivation might contribute to headaches Consider  biofeedback. With this mind-body technique, you learn to control certain bodily functions - such as muscle tension, heart rate and blood pressure - to prevent headaches or reduce headache pain.    Proceed to emergency room if you experience new or worsening symptoms or symptoms do not resolve, if you have new neurologic symptoms or if headache is severe, or for any concerning symptom.    Fluoxetine capsules or tablets (Depression/Mood Disorders)  What should I tell my health care provider before I take this medicine? They need to know if you have any of these conditions: -bipolar disorder or a family history of bipolar disorder -bleeding disorders -glaucoma -heart disease -liver disease -low levels of sodium in the blood -seizures -suicidal thoughts, plans, or attempt; a previous suicide attempt by you or a family member -take MAOIs like Carbex, Eldepryl, Marplan, Nardil, and Parnate -take medicines that treat or prevent blood clots -thyroid disease -an unusual or allergic reaction to fluoxetine, other medicines, foods, dyes, or preservatives -pregnant or trying to get pregnant -breast-feeding How should I use this medicine? Take this medicine by mouth with a glass of water. Follow the directions on the prescription label. You can take this medicine with or without food. Take your medicine at regular intervals. Do not take it more often than directed. Do not stop taking this medicine suddenly except upon the advice of your doctor. Stopping this medicine too quickly may cause serious side effects or your condition may worsen. A special MedGuide will be  given to you by the pharmacist with each prescription and refill. Be sure to read this information carefully each time. Talk to your pediatrician regarding the use of this medicine in children. While this drug may be prescribed for children as young as 7 years for selected conditions, precautions do apply. Overdosage: If you think you have  taken too much of this medicine contact a poison control center or emergency room at once. NOTE: This medicine is only for you. Do not share this medicine with others. What if I miss a dose? If you miss a dose, skip the missed dose and go back to your regular dosing schedule. Do not take double or extra doses. What may interact with this medicine? Do not take this medicine with any of the following medications: -other medicines containing fluoxetine, like Sarafem or Symbyax -cisapride -linezolid -MAOIs like Carbex, Eldepryl, Marplan, Nardil, and Parnate -methylene blue (injected into a vein) -pimozide -thioridazine This medicine may also interact with the following medications: -alcohol -amphetamines -aspirin and aspirin-like medicines -carbamazepine -certain medicines for depression, anxiety, or psychotic disturbances -certain medicines for migraine headaches like almotriptan, eletriptan, frovatriptan, naratriptan, rizatriptan, sumatriptan, zolmitriptan -digoxin -diuretics -fentanyl -flecainide -furazolidone -isoniazid -lithium -medicines for sleep -medicines that treat or prevent blood clots like warfarin, enoxaparin, and dalteparin -NSAIDs, medicines for pain and inflammation, like ibuprofen or naproxen -phenytoin -procarbazine -propafenone -rasagiline -ritonavir -supplements like St. John's wort, kava kava, valerian -tramadol -tryptophan -vinblastine This list may not describe all possible interactions. Give your health care provider a list of all the medicines, herbs, non-prescription drugs, or dietary supplements you use. Also tell them if you smoke, drink alcohol, or use illegal drugs. Some items may interact with your medicine. What should I watch for while using this medicine? Tell your doctor if your symptoms do not get better or if they get worse. Visit your doctor or health care professional for regular checks on your progress. Because it may take several weeks to  see the full effects of this medicine, it is important to continue your treatment as prescribed by your doctor. Patients and their families should watch out for new or worsening thoughts of suicide or depression. Also watch out for sudden changes in feelings such as feeling anxious, agitated, panicky, irritable, hostile, aggressive, impulsive, severely restless, overly excited and hyperactive, or not being able to sleep. If this happens, especially at the beginning of treatment or after a change in dose, call your health care professional. Dennis Bast may get drowsy or dizzy. Do not drive, use machinery, or do anything that needs mental alertness until you know how this medicine affects you. Do not stand or sit up quickly, especially if you are an older patient. This reduces the risk of dizzy or fainting spells. Alcohol may interfere with the effect of this medicine. Avoid alcoholic drinks. Your mouth may get dry. Chewing sugarless gum or sucking hard candy, and drinking plenty of water may help. Contact your doctor if the problem does not go away or is severe. This medicine may affect blood sugar levels. If you have diabetes, check with your doctor or health care professional before you change your diet or the dose of your diabetic medicine. What side effects may I notice from receiving this medicine? Side effects that you should report to your doctor or health care professional as soon as possible: -allergic reactions like skin rash, itching or hives, swelling of the face, lips, or tongue -anxious -black, tarry stools -breathing problems -changes in vision -confusion -elevated mood,  decreased need for sleep, racing thoughts, impulsive behavior -eye pain -fast, irregular heartbeat -feeling faint or lightheaded, falls -feeling agitated, angry, or irritable -hallucination, loss of contact with reality -loss of balance or coordination -loss of memory -painful or prolonged erections -restlessness, pacing,  inability to keep still -seizures -stiff muscles -suicidal thoughts or other mood changes -trouble sleeping -unusual bleeding or bruising -unusually weak or tired -vomiting Side effects that usually do not require medical attention (report to your doctor or health care professional if they continue or are bothersome): -change in appetite or weight -change in sex drive or performance -diarrhea -dry mouth -headache -increased sweating -nausea -tremors This list may not describe all possible side effects. Call your doctor for medical advice about side effects. You may report side effects to FDA at 1-800-FDA-1088. Where should I keep my medicine? Keep out of the reach of children. Store at room temperature between 15 and 30 degrees C (59 and 86 degrees F). Throw away any unused medicine after the expiration date. NOTE: This sheet is a summary. It may not cover all possible information. If you have questions about this medicine, talk to your doctor, pharmacist, or health care provider.  2018 Elsevier/Gold Standard (2015-10-16 15:55:27)  Sumatriptan tablets What is this medicine? SUMATRIPTAN (soo ma TRIP tan) is used to treat migraines with or without aura. An aura is a strange feeling or visual disturbance that warns you of an attack. It is not used to prevent migraines. This medicine may be used for other purposes; ask your health care provider or pharmacist if you have questions. COMMON BRAND NAME(S): Imitrex, Migraine Pack What should I tell my health care provider before I take this medicine? They need to know if you have any of these conditions: -circulation problems in fingers and toes -diabetes -heart disease -high blood pressure -high cholesterol -history of irregular heartbeat -history of stroke -kidney disease -liver disease -postmenopausal or surgical removal of uterus and ovaries -seizures -smoke tobacco -stomach or intestine problems -an unusual or allergic  reaction to sumatriptan, other medicines, foods, dyes, or preservatives -pregnant or trying to get pregnant -breast-feeding How should I use this medicine? Take this medicine by mouth with a glass of water. Follow the directions on the prescription label. This medicine is taken at the first symptoms of a migraine. It is not for everyday use. If your migraine headache returns after one dose, you can take another dose as directed. You must leave at least 2 hours between doses, and do not take more than 100 mg as a single dose. Do not take more than 200 mg total in any 24 hour period. If there is no improvement at all after the first dose, do not take a second dose without talking to your doctor or health care professional. Do not take your medicine more often than directed. Talk to your pediatrician regarding the use of this medicine in children. Special care may be needed. Overdosage: If you think you have taken too much of this medicine contact a poison control center or emergency room at once. NOTE: This medicine is only for you. Do not share this medicine with others. What if I miss a dose? This does not apply; this medicine is not for regular use. What may interact with this medicine? Do not take this medicine with any of the following medicines: -cocaine -ergot alkaloids like dihydroergotamine, ergonovine, ergotamine, methylergonovine -feverfew -MAOIs like Carbex, Eldepryl, Marplan, Nardil, and Parnate -other medicines for migraine headache like almotriptan, eletriptan, frovatriptan, naratriptan,  rizatriptan, zolmitriptan -tryptophan This medicine may also interact with the following medications: -certain medicines for depression, anxiety, or psychotic disturbances This list may not describe all possible interactions. Give your health care provider a list of all the medicines, herbs, non-prescription drugs, or dietary supplements you use. Also tell them if you smoke, drink alcohol, or use  illegal drugs. Some items may interact with your medicine. What should I watch for while using this medicine? Only take this medicine for a migraine headache. Take it if you get warning symptoms or at the start of a migraine attack. It is not for regular use to prevent migraine attacks. You may get drowsy or dizzy. Do not drive, use machinery, or do anything that needs mental alertness until you know how this medicine affects you. To reduce dizzy or fainting spells, do not sit or stand up quickly, especially if you are an older patient. Alcohol can increase drowsiness, dizziness and flushing. Avoid alcoholic drinks. Smoking cigarettes may increase the risk of heart-related side effects from using this medicine. If you take migraine medicines for 10 or more days a month, your migraines may get worse. Keep a diary of headache days and medicine use. Contact your healthcare professional if your migraine attacks occur more frequently. What side effects may I notice from receiving this medicine? Side effects that you should report to your doctor or health care professional as soon as possible: -allergic reactions like skin rash, itching or hives, swelling of the face, lips, or tongue -bloody or watery diarrhea -hallucination, loss of contact with reality -pain, tingling, numbness in the face, hands, or feet -seizures -signs and symptoms of a blood clot such as breathing problems; changes in vision; chest pain; severe, sudden headache; pain, swelling, warmth in the leg; trouble speaking; sudden numbness or weakness of the face, arm, or leg -signs and symptoms of a dangerous change in heartbeat or heart rhythm like chest pain; dizziness; fast or irregular heartbeat; palpitations, feeling faint or lightheaded; falls; breathing problems -signs and symptoms of a stroke like changes in vision; confusion; trouble speaking or understanding; severe headaches; sudden numbness or weakness of the face, arm, or leg;  trouble walking; dizziness; loss of balance or coordination -stomach pain Side effects that usually do not require medical attention (report to your doctor or health care professional if they continue or are bothersome): -changes in taste -facial flushing -headache -muscle cramps -muscle pain -nausea, vomiting -weak or tired This list may not describe all possible side effects. Call your doctor for medical advice about side effects. You may report side effects to FDA at 1-800-FDA-1088. Where should I keep my medicine? Keep out of the reach of children. Store at room temperature between 2 and 30 degrees C (36 and 86 degrees F). Throw away any unused medicine after the expiration date. NOTE: This sheet is a summary. It may not cover all possible information. If you have questions about this medicine, talk to your doctor, pharmacist, or health care provider.  2018 Elsevier/Gold Standard (2015-06-17 12:38:23)

## 2017-01-30 ENCOUNTER — Ambulatory Visit (INDEPENDENT_AMBULATORY_CARE_PROVIDER_SITE_OTHER): Payer: PRIVATE HEALTH INSURANCE | Admitting: Neurology

## 2017-01-30 ENCOUNTER — Encounter: Payer: Self-pay | Admitting: Neurology

## 2017-01-30 VITALS — BP 110/84 | HR 80 | Ht 63.0 in | Wt 153.0 lb

## 2017-01-30 DIAGNOSIS — G43109 Migraine with aura, not intractable, without status migrainosus: Secondary | ICD-10-CM

## 2017-01-30 DIAGNOSIS — G43009 Migraine without aura, not intractable, without status migrainosus: Secondary | ICD-10-CM

## 2017-01-30 DIAGNOSIS — M542 Cervicalgia: Secondary | ICD-10-CM | POA: Diagnosis not present

## 2017-01-30 MED ORDER — TIZANIDINE HCL 4 MG PO TABS: 4.0000 mg | ORAL_TABLET | Freq: Three times a day (TID) | ORAL | 6 refills | Status: DC | PRN

## 2017-01-30 MED ORDER — FLUOXETINE HCL 40 MG PO CAPS: 40.0000 mg | ORAL_CAPSULE | Freq: Every day | ORAL | 6 refills | Status: DC

## 2017-01-30 MED ORDER — SUMATRIPTAN SUCCINATE 100 MG PO TABS: 100.0000 mg | ORAL_TABLET | Freq: Once | ORAL | 12 refills | Status: DC | PRN

## 2017-01-30 NOTE — Progress Notes (Signed)
[facility]    Provider:  Dr Jaynee Eagles Referring Provider: Deland Pretty, MD Primary Care Physician:  Deland Pretty, MD   CC:  Migraine associated with left sided sensory changes and weakness.  Interval history 01/30/2017: Patient returns for follow up, imitrex works very well. Discussed prozac which is helping, will increase dose. She has lost 12 pounds, is trying to de-stress and take better care of herself. Could add on Wellbutrin as well if needed. Much improved.  HPI:  Karen Scott is a 61 y.o. female here as a referral from ED for Migraine associated with left sided sensory changes and weakness. . She has a hx of migraines since a teenager. She has a past medical history of hypertension, diabetes, hyperlipidemia, coronary artery disease. She's had several episodes of left sided symptoms initially thought to be CVA and now diagnosed as "complicated migraine". In 2014 the sun was glistening of the water and she was getting a headache and her face started twitching, they spent one day in [place] in the ED. On the CAT scan they may have seen a stroke in the past but subsequent workup with MRI was negative. In 2016 she had another episode that was similar. A few weeks ago had another episode and her face got numb and lips tingling. The next day symptoms were worse and she went to the ED. She had a paradoxical rxn to Phenergan. The headaches on the left side, throbbing, behind the left eye, painful, her face will go numb and her face will twitch with left-sided weakness. No aura. No medication overuse. Endorses nausea no vomiting. Stressful events are triggers. Dark room and quiet makes it better. These have been some stressful events. Mother with migraines. She has half the month of headaches(15/30) and. 8-9 are migrainous in the setting of recent stress. No other focal neurologic deficits, associated symptoms, inciting events or modifiable factors. No aura.     Reviewed notes,  labs and imaging from outside physicians, which showed:  Reviewed emergency room visits. Patient was seen on March 23 presented with left-sided facial numbness. There was persistent since the previous night also left sided headache behind the eye with a dull pain. She also reported some weakness in the left side and some changes in sensation. Has had episodes like this before. Review of exam showed that there was slightly decreased grip strength on the left compared to the right. There is also some slightly decreased in the left face and upper lower and mid fields. She did split in the midline. Otherwise exam unremarkable. Patient has similar symptoms 2 years previous and was worked up, negative MRI for infarct at that time. Her headaches improved with migraine treatment. She did have echo this he had versus dystonic reaction to Phenergan, became anxious and started pacing the room. Given Benadryl. Reviewed all past emergency room visits, significant for a visit in 2014 with similar symptoms left facial dysesthesias, visual changes in the setting of migraine with intermittent left eye visual blurriness, posterior left head pain. At that time patient said that she had a similar symptom a year previous and told she had a possible stroke however CT scan was negative and subsequent follow-up with neurology was inconclusive (so would not say that stroke is a diagnosis of this patient). Imaging at that time was unremarkable and she improved with migraine cocktail/migraine treatment.  Personally reviewed MRI images from June 2014 which showed normal for age brain.  Reviewed labs. CMP was remarkable only for elevated  glucose with normal BUN and creatinine. CBC was normal. Labs were drawn during last emergency room visit 08/18/2016.  Review of Systems: Patient complains of symptoms per HPI as well as the following symptoms: no CP, no SOB. Pertinent negatives per HPI. All others negative.   Social History    Social History  . Marital status: Married    Spouse name: N/A  . Number of children: N/A  . Years of education: N/A   Occupational History  . [employer]    Social History Main Topics  . Smoking status: Never Smoker  . Smokeless tobacco: Never Used  . Alcohol use No     Comment: socially  . Drug use: No  . Sexual activity: Not on file   Other Topics Concern  . Not on file   Social History Narrative  . No narrative on file    Family History  Problem Relation Age of Onset  . Diabetes Mother   . Hyperlipidemia Mother   . Hypertension Mother   . Stroke Mother   . Lung cancer Father   . Hearing loss Maternal Grandmother   . Hearing loss Paternal Grandmother   . Hearing loss Paternal Grandfather     Past Medical History:  Diagnosis Date  . Abdominal wall hernia   . Adenomatous colon polyp   . Anxiety   . CAD (coronary artery disease)   . Carpal tunnel syndrome   . Diabetes mellitus without complication ([place])   . Diverticulosis   . DVT (deep venous thrombosis) ([place])   . Esophageal stricture   . GERD (gastroesophageal reflux disease)   . Hepatic steatosis   . Hyperlipidemia   . Hypertension   . Internal hemorrhoids   . Ovarian cyst   . Shingles     Past Surgical History:  Procedure Laterality Date  . ANKLE SURGERY     left ankle ligament repair  . BREAST SURGERY     biopsy  . CHOLECYSTECTOMY    . JOINT REPLACEMENT  2008    Current Outpatient Prescriptions  Medication Sig Dispense Refill  . aspirin 81 MG chewable tablet Chew 81 mg by mouth every morning.    Marland Kitchen atorvastatin (LIPITOR) 20 MG tablet Take 20 mg by mouth every evening.     Marland Kitchen FLUoxetine (PROZAC) 20 MG capsule Take 1 capsule (20 mg total) by mouth daily. 30 capsule 6  . losartan-hydrochlorothiazide (HYZAAR) 100-25 MG per tablet Take 1 tablet by mouth every morning.     . metFORMIN (GLUCOPHAGE-XR) 500 MG 24 hr tablet Take 500 mg by mouth 2 (two) times daily.    . Omega-3 Fatty  Acids (FISH OIL PO) Take by mouth.    . Pantoprazole Sodium (PROTONIX PO) Take by mouth 2 (two) times daily.    . SUMAtriptan (IMITREX) 100 MG tablet Take 1 tablet (100 mg total) by mouth once as needed. May repeat in 2 hours if headache persists or recurs. 10 tablet 12  . therapeutic multivitamin-minerals (THERAGRAN-M) tablet Take 1 tablet by mouth daily.     No current facility-administered medications for this visit.     Allergies as of 01/30/2017 - Review Complete 01/30/2017  Allergen Reaction Noted  . Erythromycin  10/12/2013  . Penicillins  09/07/2012  . Tramadol  09/26/2016    Vitals: BP 110/84   Pulse 80   Ht 5\' 3"  (1.6 m)   Wt 153 lb (69.4 kg)   BMI 27.10 kg/m  Last Weight:  Wt Readings from Last 1 Encounters:  01/30/17 153 lb (69.4 kg)   Last Height:   Ht Readings from Last 1 Encounters:  01/30/17 5\' 3"  (1.6 m)    Physical exam: Exam: Gen: NAD, conversant, well nourised, obese, well groomed                     CV: RRR, no MRG. No Carotid Bruits. No peripheral edema, warm, nontender Eyes: Conjunctivae clear without exudates or hemorrhage  Neuro: Detailed Neurologic Exam  Speech:    Speech is normal; fluent and spontaneous with normal comprehension.  Cognition:    The patient is oriented to person, place, and time;     recent and remote memory intact;     language fluent;     normal attention, concentration,     fund of knowledge Cranial Nerves:    The pupils are equal, round, and reactive to light. The fundi are normal and spontaneous venous pulsations are present. Visual fields are full to finger confrontation. Extraocular movements are intact. Trigeminal sensation is intact and the muscles of mastication are normal. The face is symmetric. The palate elevates in the midline. Hearing intact. Voice is normal. Shoulder shrug is normal. The tongue has normal motion without fasciculations.   Motor Observation:    No asymmetry, no atrophy, and no involuntary  movements noted. Tone:    Normal muscle tone.    Posture:    Posture is normal. normal erect    Strength:    Strength is V/V in the upper and lower limbs.      Sensation: intact to LT          Assessment/Plan:    61 year old patient with migraines associated with left-sided weakness and sensory changes. Workup has been negative to date for stroke or other etiologies and MRI of the brain in the past normal for age. She is much improved.  Increase Fluoxetine 40mg  daily which may help with her increases in stress as well, can add Wellbutrin if needed Keep a headache diary and email it to me Keep me updated via email on progress Imitrex: Please take one tablet at the onset of your headache. If it does not improve the symptoms please take one additional tablet in 2 hours. Do not take more then 2 tablets in 24hrs. Do not take use more then 2 to 3 times in a week. Refer to [facility] and see which one better for insurance: PT, Massage/manual therapy, stretching, strengthening, TENs, dry needling for cervicalgia, musculoskeletal neck pain. migrianes  Sarina Ill, MD  [facility] [address] [place], [place] [address]  Phone [phone] Fax [phone]  A total of 25 minutes was spent face-to-face with this patient. Over half this time was spent on counseling patient on the migraine diagnosis and different diagnostic and therapeutic options available.

## 2017-02-06 ENCOUNTER — Telehealth: Payer: Self-pay | Admitting: Neurology

## 2017-02-06 NOTE — Telephone Encounter (Signed)
Referral has been sent to [facility] Fax [phone].

## 2017-02-16 ENCOUNTER — Encounter (HOSPITAL_COMMUNITY): Payer: Self-pay | Admitting: Emergency Medicine

## 2017-02-16 ENCOUNTER — Ambulatory Visit (HOSPITAL_COMMUNITY)
Admission: EM | Admit: 2017-02-16 | Discharge: 2017-02-16 | Disposition: A | Discharge status: Home / Self Care | Payer: Self-pay | Attending: Internal Medicine | Admitting: Internal Medicine

## 2017-02-16 DIAGNOSIS — M25512 Pain in left shoulder: Secondary | ICD-10-CM

## 2017-02-16 MED ORDER — KETOROLAC TROMETHAMINE 30 MG/ML IJ SOLN
INTRAMUSCULAR | Status: AC
  Filled 2017-02-16: qty 1

## 2017-02-16 MED ORDER — KETOROLAC TROMETHAMINE 30 MG/ML IJ SOLN
30.0000 mg | Freq: Once | INTRAMUSCULAR | Status: AC
  Administered 2017-02-16: 30 mg via INTRAMUSCULAR

## 2017-02-16 MED ORDER — CYCLOBENZAPRINE HCL 10 MG PO TABS: 5.0000 mg | ORAL_TABLET | Freq: Two times a day (BID) | ORAL | 0 refills | Status: DC | PRN

## 2017-02-16 MED ORDER — MELOXICAM 7.5 MG PO TABS: 7.5000 mg | ORAL_TABLET | Freq: Every day | ORAL | 0 refills | Status: DC

## 2017-02-16 NOTE — ED Triage Notes (Signed)
Pt here for left arm pain onset 3 days associated w/neck pain ... Unable to raise arm   Denies inj/trauma, strenuous activity   Pt is a sign language interpreter  Denies CP, dyspnea, SOB  A&O x4... NAD... Ambulatory

## 2017-02-16 NOTE — Discharge Instructions (Signed)
Your symptoms could be due to tendinitis. Start mobic as directed. Flexeril as needed at night. Flexeril can make you drowsy, so do not take if you are going to drive, operate heavy machinery, or make important decisions. Ice/heat compresses as needed. This can take up to 3-4 weeks to completely resolve, but you should be feeling better each week. Follow up here or with PCP if symptoms worsen, changes for reevaluation.

## 2017-02-16 NOTE — ED Provider Notes (Signed)
[facility]    CSN: [id number] Arrival date & time: 02/16/17  1638     History   Chief Complaint Chief Complaint  Patient presents with  . Arm Pain    HPI Karen Scott is a 61 y.o. female.   61 year old female CAD, diabetes, hyperlipidemia, hypertension comes in for 2-3 day history of left shoulder pain and cramping. Patient denies injury/trauma, increased activity. 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 She states that pain first starting from her left shoulder, but now is extending to left neck. She has tried ibuprofen and naproxen without relief. Has had some numbness/tingling of her fingers. States her DM is well controlled. She states CAD in the past was due to stress while caring for her husband who was dying from cancer. They had done a cath on her and found small blockage. Has not seen a cardiologist in many years. Exercise occasionally and denies angina, chest pain, shortness of breath, weakness, dizziness, diaphoresis.       Past Medical History:  Diagnosis Date  . Abdominal wall hernia   . Adenomatous colon polyp   . Anxiety   . CAD (coronary artery disease)   . Carpal tunnel syndrome   . Diabetes mellitus without complication ([place])   . Diverticulosis   . DVT (deep venous thrombosis) ([place])   . Esophageal stricture   . GERD (gastroesophageal reflux disease)   . Hepatic steatosis   . Hyperlipidemia   . Hypertension   . Internal hemorrhoids   . Ovarian cyst   . Shingles     Patient Active Problem List   Diagnosis Date Noted  . 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 HTN (hypertension) 10/12/2013  . Type II or unspecified type diabetes mellitus without mention of complication, not stated as uncontrolled 10/12/2013    Past Surgical History:  Procedure Laterality Date  . ANKLE SURGERY     left ankle ligament repair  . BREAST SURGERY     biopsy  . CHOLECYSTECTOMY    . JOINT REPLACEMENT  2008    OB  History    No data available       Home Medications    Prior to Admission medications   Medication Sig Start Date End Date Taking? Authorizing Provider  aspirin 81 MG chewable tablet Chew 81 mg by mouth every morning.   Yes [provider]  atorvastatin (LIPITOR) 20 MG tablet Take 20 mg by mouth every evening.    Yes [provider]  FLUoxetine (PROZAC) 40 MG capsule Take 1 capsule (40 mg total) by mouth daily. 01/30/17  Yes Melvenia Beam, MD  losartan-hydrochlorothiazide (HYZAAR) 100-25 MG per tablet Take 1 tablet by mouth every morning.    Yes [provider]  metFORMIN (GLUCOPHAGE-XR) 500 MG 24 hr tablet Take 500 mg by mouth 2 (two) times daily.   Yes [provider]  Omega-3 Fatty Acids (FISH OIL PO) Take by mouth.   Yes [provider]  Pantoprazole Sodium (PROTONIX PO) Take by mouth 2 (two) times daily.   Yes [provider]  SUMAtriptan (IMITREX) 100 MG tablet Take 1 tablet (100 mg total) by mouth once as needed. May repeat in 2 hours if headache persists or recurs. 01/30/17  Yes Melvenia Beam, MD  therapeutic multivitamin-minerals [facility]) tablet Take 1 tablet by mouth daily.   Yes [provider]  tiZANidine (ZANAFLEX) 4 MG tablet Take 1 tablet (4 mg total)  by mouth every 8 (eight) hours as needed for muscle spasms. 01/30/17  Yes Melvenia Beam, MD  cyclobenzaprine (FLEXERIL) 10 MG tablet Take 0.5-1 tablets (5-10 mg total) by mouth 2 (two) times daily as needed for muscle spasms. 02/16/17   Tasia Catchings, Amy V, PA-C  meloxicam (MOBIC) 7.5 MG tablet Take 1 tablet (7.5 mg total) by mouth daily. 02/16/17   Ok Edwards, PA-C    Family History Family History  Problem Relation Age of Onset  . Diabetes Mother   . Hyperlipidemia Mother   . Hypertension Mother   . Stroke Mother   . Lung cancer Father   . Hearing loss Maternal Grandmother   . Hearing loss Paternal Grandmother   . Hearing loss Paternal Grandfather     Social  History Social History  Substance Use Topics  . Smoking status: Never Smoker  . Smokeless tobacco: Never Used  . Alcohol use No     Comment: socially     Allergies   Erythromycin; Penicillins; and Tramadol   Review of Systems Review of Systems  Reason unable to perform ROS: See HPI as above.     Physical Exam Triage Vital Signs ED Triage Vitals  Enc Vitals Group     BP 02/16/17 1753 119/80     Pulse Rate 02/16/17 1753 65     Resp 02/16/17 1753 18     Temp 02/16/17 1753 97.9 F (36.6 C)     Temp Source 02/16/17 1753 Oral     SpO2 02/16/17 1753 100 %     Weight --      Height --      Head Circumference --      Peak Flow --      Pain Score 02/16/17 1754 8     Pain Loc --      Pain Edu? --      Excl. in [place]? --    No data found.   Updated Vital Signs BP 119/80 (BP Location: Right Arm)   Pulse 65   Temp 97.9 F (36.6 C) (Oral)   Resp 18   SpO2 100%   Visual Acuity Right Eye Distance:   Left Eye Distance:   Bilateral Distance:    Right Eye Near:   Left Eye Near:    Bilateral Near:     Physical Exam  Constitutional: She is oriented to person, place, and time. She appears well-developed and well-nourished. No distress.  HENT:  Head: Normocephalic and atraumatic.  Eyes: Pupils are equal, round, and reactive to light. Conjunctivae are normal.  Neck: Normal range of motion. Neck supple.  Cardiovascular: Normal rate, regular rhythm and normal heart sounds.  Exam reveals no gallop and no friction rub.   No murmur heard. Pulmonary/Chest: Effort normal and breath sounds normal. She has no wheezes. She has no rales.  Musculoskeletal:  Diffuse tenderness to palpation of neck, shoulder, upper back. Decreased ROM due to pain. Patient refused passive ROM due to pain. Strength of shoulder deferred. Strength normal and equal of elbow and fingers. Sensation intact and equal bilaterally.  Radial pulses 2+ and equal bilaterally. Capillary refill less than 2 seconds.     Neurological: She is alert and oriented to person, place, and time.  Skin: Skin is warm and dry.     UC Treatments / Results  Labs (all labs ordered are listed, but only abnormal results are displayed) Labs Reviewed - No data to display  EKG  EKG Interpretation None  Radiology No results found.  Procedures Procedures (including critical care time)  Medications Ordered in UC Medications  ketorolac (TORADOL) 30 MG/ML injection 30 mg (30 mg Intramuscular Given 02/16/17 1855)     Initial Impression / Assessment and Plan / UC Course  I have reviewed the triage vital signs and the nursing notes.  Pertinent labs & imaging results that were available during my care of the patient were reviewed by me and considered in my medical decision making (see chart for details).     Discussed with patient history and exam most consistent with muscle strain/tendinitis. Toradol injection in office. Start NSAID as directed for pain and inflammation. Muscle relaxant as needed. Ice/heat compresses. Discussed with patient this can take up to 3-4 weeks to resolve, but should be getting better each week. Return precautions given.    Final Clinical Impressions(s) / UC Diagnoses   Final diagnoses:  Acute pain of left shoulder    New Prescriptions New Prescriptions   CYCLOBENZAPRINE (FLEXERIL) 10 MG TABLET    Take 0.5-1 tablets (5-10 mg total) by mouth 2 (two) times daily as needed for muscle spasms.   MELOXICAM (MOBIC) 7.5 MG TABLET    Take 1 tablet (7.5 mg total) by mouth daily.      Ok Edwards, PA-C 02/16/17 1857

## 2017-04-24 ENCOUNTER — Other Ambulatory Visit: Payer: Self-pay | Admitting: Neurology

## 2017-06-06 ENCOUNTER — Telehealth: Payer: Self-pay | Admitting: Neurology

## 2017-06-06 MED ORDER — FLUOXETINE HCL 40 MG PO CAPS: 40.0000 mg | ORAL_CAPSULE | Freq: Every day | ORAL | 11 refills | Status: DC

## 2017-06-06 MED ORDER — FLUOXETINE HCL 20 MG PO CAPS: 20.0000 mg | ORAL_CAPSULE | Freq: Every day | ORAL | 11 refills | Status: DC

## 2017-06-06 MED ORDER — FLUOXETINE HCL 60 MG PO TABS: 1.0000 | ORAL_TABLET | Freq: Every day | ORAL | 3 refills | Status: DC

## 2017-06-06 NOTE — Telephone Encounter (Signed)
Tried to call the patient again to discuss her Prozac and Imitrex as I have not placed the order for Maxalt yet until discussed with patient.

## 2017-06-06 NOTE — Telephone Encounter (Addendum)
Spoke with Dr. Jaynee Eagles. Prozac 20 mg capsule daily, 30 tablet 11 refills e-scribed in addition to Prozac 40 mg capsule daily, 30 tablets 11 refills. Both sent to [facility]. Called patient. She verbalized understanding that both 40 & 20 mg Prozac capsule prescriptions have been sent to her pharmacy.

## 2017-06-06 NOTE — Addendum Note (Signed)
Addended by: Gildardo Griffes on: 06/06/2017 04:00 PM   Modules accepted: Orders

## 2017-06-06 NOTE — Telephone Encounter (Signed)
Spoke with patient. She states she is fine to continue taking her Imitrex. She will just only take it when she can be at home due to drowsiness. She states that she would like to continue taking Prozac 40 mg capsule daily and have an additional prescription for Prozac 20 mg as a daily PRN medication and would only take 1 if needed for breakthrough headaches. I informed her that I would discuss with Dr. Jaynee Eagles and call her back. She verbalized understanding and appreciation.

## 2017-06-06 NOTE — Telephone Encounter (Addendum)
Per Dr. Jaynee Eagles, patient may increase to 60 mg of Prozac daily, 90 tablets with 3 refills. Also, patient can switch from Imitrex to Maxalt 10 mg tablets, 10 tablets with 11 refills.   I d/c'd the Fluoxetine (Prozac) 40 mg capsule order and placed a new order for Fluoxetine HCL 60 mg tablets, 1 tablet daily. Dr. Jaynee Eagles aware. Sent to [facility]. I called patient to discuss all of this and LVM asking for call back.

## 2017-06-06 NOTE — Telephone Encounter (Signed)
Pt is calling re: her FLUoxetine (PROZAC) 40 MG capsule, she states that the 40 has been helping but due to health status of her mother over the holidays she has experienced break thru head aches and has been taking the 20 mg in addition to the FLUoxetine (PROZAC) 40 MG capsule which has helped.  Pt would like to know if Dr Jaynee Eagles would consider increasing her FLUoxetine [facility])  Or allow her to continue to take 20 mg and 40 mg when needed.  Pt states what also helps when she is at home is SUMAtriptan (IMITREX) 100 MG tablet, it knocks her out.  Pt is asking to be called re: what can be done.  Pt confirmed that she still uses  [facility] [address], [place] [phone] (Phone) [phone] (Fax)

## 2017-06-06 NOTE — Addendum Note (Signed)
Addended by: Gildardo Griffes on: 06/06/2017 11:28 AM   Modules accepted: Orders

## 2017-10-13 ENCOUNTER — Other Ambulatory Visit: Payer: Self-pay | Admitting: Neurology

## 2018-01-31 ENCOUNTER — Ambulatory Visit: Payer: PRIVATE HEALTH INSURANCE | Admitting: Neurology

## 2018-02-20 ENCOUNTER — Other Ambulatory Visit: Payer: Self-pay | Admitting: Neurology

## 2018-02-20 ENCOUNTER — Other Ambulatory Visit: Payer: Self-pay | Admitting: *Deleted

## 2018-02-21 ENCOUNTER — Telehealth: Payer: Self-pay | Admitting: Neurology

## 2018-02-21 DIAGNOSIS — G43009 Migraine without aura, not intractable, without status migrainosus: Secondary | ICD-10-CM

## 2018-02-21 DIAGNOSIS — G43109 Migraine with aura, not intractable, without status migrainosus: Secondary | ICD-10-CM

## 2018-02-21 MED ORDER — FLUOXETINE HCL 20 MG PO CAPS: ORAL_CAPSULE | ORAL | 1 refills | Status: DC

## 2018-02-21 MED ORDER — SUMATRIPTAN SUCCINATE 100 MG PO TABS: 100.0000 mg | ORAL_TABLET | Freq: Once | ORAL | 1 refills | Status: DC | PRN

## 2018-02-21 NOTE — Telephone Encounter (Signed)
Pt called stating due to her mother having cancer she was cleared by Dr. Jaynee Eagles to take extra dosing for medication Fluoxeine 20MG  and 40MG . Requesting a call to discuss refills please advise

## 2018-02-21 NOTE — Telephone Encounter (Signed)
Called pt back and LVM (ok per DPR) asking for call back to discuss her medication. Left office number in message.

## 2018-02-21 NOTE — Addendum Note (Signed)
Addended by: Gildardo Griffes on: 02/21/2018 11:13 AM   Modules accepted: Orders

## 2018-02-21 NOTE — Telephone Encounter (Addendum)
Spoke with patient. 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 She would like to continue taking 40 mg (2 capsules) with the ability to take an additional 20 mg (1 capsule) if needed each day. RN advised that we will make this adjustment for her and send in enough refills to get her through her appt with Dr. Jaynee Eagles next month. Pt verbalized appreciation. She also requested a refill of her Sumatriptan.   Discussed with Dr. Jaynee Eagles and refills were sent for Fluoxetine 40 mg daily may take additional 20 mg daily if needed. Also sent refill of Sumatriptan.    Faxed Sumatriptan prescription to pharmacy. Received a receipt of confirmation.

## 2018-03-26 ENCOUNTER — Ambulatory Visit: Payer: PRIVATE HEALTH INSURANCE | Admitting: Neurology

## 2018-04-18 ENCOUNTER — Other Ambulatory Visit: Payer: Self-pay | Admitting: Neurology

## 2018-05-14 ENCOUNTER — Encounter: Payer: Self-pay | Admitting: Neurology

## 2018-05-14 ENCOUNTER — Ambulatory Visit (INDEPENDENT_AMBULATORY_CARE_PROVIDER_SITE_OTHER): Payer: PRIVATE HEALTH INSURANCE | Admitting: Neurology

## 2018-05-14 VITALS — BP 134/86 | HR 93 | Ht 63.0 in | Wt 157.0 lb

## 2018-05-14 DIAGNOSIS — G43109 Migraine with aura, not intractable, without status migrainosus: Secondary | ICD-10-CM | POA: Diagnosis not present

## 2018-05-14 DIAGNOSIS — G43009 Migraine without aura, not intractable, without status migrainosus: Secondary | ICD-10-CM | POA: Diagnosis not present

## 2018-05-14 MED ORDER — FLUOXETINE HCL 20 MG PO CAPS: ORAL_CAPSULE | ORAL | 0 refills | Status: DC

## 2018-05-14 MED ORDER — SUMATRIPTAN SUCCINATE 100 MG PO TABS: 100.0000 mg | ORAL_TABLET | Freq: Once | ORAL | 11 refills | Status: DC | PRN

## 2018-05-14 MED ORDER — ERENUMAB-AOOE 140 MG/ML ~~LOC~~ SOAJ: 140.0000 mg | SUBCUTANEOUS | 0 refills | Status: DC

## 2018-05-14 MED ORDER — TIZANIDINE HCL 4 MG PO TABS: 4.0000 mg | ORAL_TABLET | Freq: Three times a day (TID) | ORAL | 11 refills | Status: DC | PRN

## 2018-05-14 NOTE — Patient Instructions (Addendum)
Erenumab monthly Email in 2 months for updates  Erenumab: Patient drug information [facility] here. Copyright [phone] [employer] All rights reserved. (For additional information see "Erenumab: Drug information") Brand Names: [place]  Aimovig;  Aimovig (140 MG Dose) [DSC]  Brand Names: [place]  Aimovig  What is this drug used for?   It is used to prevent migraine headaches.  What do I need to tell my doctor BEFORE I take this drug?   If you are allergic to this drug; any part of this drug; or any other drugs, foods, or substances. Tell your doctor about the allergy and what signs you had.   This drug may interact with other drugs or health problems.   Tell your doctor and pharmacist about all of your drugs (prescription or OTC, natural products, vitamins) and health problems. You must check to make sure that it is safe for you to take this drug with all of your drugs and health problems. Do not start, stop, or change the dose of any drug without checking with your doctor.  What are some things I need to know or do while I take this drug?   Tell all of your health care providers that you take this drug. This includes your doctors, nurses, pharmacists, and dentists.   If you have a latex allergy, talk with your doctor.   Tell your doctor if you are pregnant, plan on getting pregnant, or are breast-feeding. You will need to talk about the benefits and risks to you and the baby.  What are some side effects that I need to call my doctor about right away?   WARNING/CAUTION: Even though it may be rare, some people may have very bad and sometimes deadly side effects when taking a drug. Tell your doctor or get medical help right away if you have any of the following signs or symptoms that may be related to a very bad side effect:   Signs of an allergic reaction, like rash; hives; itching; red, swollen, blistered, or peeling skin with or without fever; wheezing; tightness in the  chest or throat; trouble breathing, swallowing, or talking; unusual hoarseness; or swelling of the mouth, face, lips, tongue, or throat.  What are some other side effects of this drug?   All drugs may cause side effects. However, many people have no side effects or only have minor side effects. Call your doctor or get medical help if any of these side effects or any other side effects bother you or do not go away:   Pain, redness, or swelling where the shot was given.   Constipation is common with this drug. In some people, severe constipation led to treatment in a hospital or surgery. Call your doctor right away if you have constipation that is severe or does not go away.   These are not all of the side effects that may occur. If you have questions about side effects, call your doctor. Call your doctor for medical advice about side effects.   You may report side effects to your national health agency.  How is this drug best taken?   Use this drug as ordered by your doctor. Read all information given to you. Follow all instructions closely.   It is given as a shot into the fatty part of the skin on the top of the thigh, belly area, or upper arm.   If you will be giving yourself the shot, your doctor or nurse will teach you how to  give the shot.   If stored in a refrigerator, let this drug come to room temperature before using it. Leave it at room temperature for at least 30 minutes. Do not heat this drug.   Protect from heat and sunlight.   Do not shake.   Do not give into skin within 2 inches of the belly button.   Do not give into skin that is irritated, tender, bruised, red, scaly, hard, scarred, or has stretch marks.   Do not use if the solution is cloudy, leaking, or has particles.   This drug is colorless to a faint yellow. Do not use if the solution changes color.   Throw away after using. Do not use the device more than 1 time.   Throw away needles in a needle/sharp disposal box. Do not  reuse needles or other items. When the box is full, follow all local rules for getting rid of it. Talk with a doctor or pharmacist if you have any questions.  What do I do if I miss a dose?   Take a missed dose as soon as you think about it.   After taking a missed dose, start a new schedule based on when the dose is taken.  How do I store and/or throw out this drug?   Store in a refrigerator. Do not freeze.   Store in the original container to protect from light.   Do not use if it has been frozen.   If you drop this drug on a hard surface, do not use it.   If needed, you may store at room temperature for up to 7 days. Write down the date you take this drug out of the refrigerator. If stored at room temperature and not used within 7 days, throw this drug away.   Do not put this drug back in the refrigerator after it has been stored at room temperature.   Keep all drugs in a safe place. Keep all drugs out of the reach of children and pets.   Throw away unused or expired drugs. Do not flush down a toilet or pour down a drain unless you are told to do so. Check with your pharmacist if you have questions about the best way to throw out drugs. There may be drug take-back programs in your area.  General drug facts   If your symptoms or health problems do not get better or if they become worse, call your doctor.   Do not share your drugs with others and do not take anyone else's drugs.   Some drugs may have another patient information leaflet. If you have any questions about this drug, please talk with your doctor, nurse, pharmacist, or other health care provider.   If you think there has been an overdose, call your poison control center or get medical care right away. Be ready to tell or show what was taken, how much, and when it happened.

## 2018-05-14 NOTE — Progress Notes (Signed)
[facility]    Provider:  Dr Jaynee Eagles Referring Provider: Deland Pretty, MD Primary Care Physician:  Deland Pretty, MD   CC:  Migraine associated with left sided sensory changes and weakness.  Interval history 05/14/2018:  Since last appointment her mother passed away and she is seeing a grief counselor. She is having 608 migraines a week. She is not doing well. Lots of stress. A dark quiet place helps. She is under stress. She is uninsured right now, gave 2 aimovig samples and she will email and let us know how htings are going whether she wants to continue with a few more samples or once the holidays are over she feels better.  Meds tried; Wellbutrin, prozac, topamax, amitriptyline, propranolol, flexeril  Interval history 01/30/2017: Patient returns for follow up, imitrex works very well. Discussed prozac which is helping, will increase dose. She has lost 12 pounds, is trying to de-stress and take better care of herself. Could add on Wellbutrin as well if needed. Much improved.    HPI:  Karen Scott is a 62 y.o. female here as a referral from ED for Migraine associated with left sided sensory changes and weakness. . She has a hx of migraines since a teenager. She has a past medical history of hypertension, diabetes, hyperlipidemia, coronary artery disease. She's had several episodes of left sided symptoms initially thought to be CVA and now diagnosed as "complicated migraine". In 2014 the sun was glistening of the water and she was getting a headache and her face started twitching, they spent one day in [place] in the ED. On the CAT scan they may have seen a stroke in the past but subsequent workup with MRI was negative. In 2016 she had another episode that was similar. A few weeks ago had another episode and her face got numb and lips tingling. The next day symptoms were worse and she went to the ED. She had a paradoxical rxn to Phenergan. The headaches on the left side,  throbbing, behind the left eye, painful, her face will go numb and her face will twitch with left-sided weakness. No aura. No medication overuse. Endorses nausea no vomiting. Stressful events are triggers. Dark room and quiet makes it better. These have been some stressful events. Mother with migraines. She has half the month of headaches(15/30) and. 8-9 are migrainous in the setting of recent stress. No other focal neurologic deficits, associated symptoms, inciting events or modifiable factors. No aura.     Reviewed notes, labs and imaging from outside physicians, which showed:  Reviewed emergency room visits. Patient was seen on March 23 presented with left-sided facial numbness. There was persistent since the previous night also left sided headache behind the eye with a dull pain. She also reported some weakness in the left side and some changes in sensation. Has had episodes like this before. Review of exam showed that there was slightly decreased grip strength on the left compared to the right. There is also some slightly decreased in the left face and upper lower and mid fields. She did split in the midline. Otherwise exam unremarkable. Patient has similar symptoms 2 years previous and was worked up, negative MRI for infarct at that time. Her headaches improved with migraine treatment. She did have echo this he had versus dystonic reaction to Phenergan, became anxious and started pacing the room. Given Benadryl. Reviewed all past emergency room visits, significant for a visit in 2014 with similar symptoms left facial dysesthesias, visual changes in the  setting of migraine with intermittent left eye visual blurriness, posterior left head pain. At that time patient said that she had a similar symptom a year previous and told she had a possible stroke however CT scan was negative and subsequent follow-up with neurology was inconclusive (so would not say that stroke is a diagnosis of this patient).  Imaging at that time was unremarkable and she improved with migraine cocktail/migraine treatment.  Personally reviewed MRI images from June 2014 which showed normal for age brain.  Reviewed labs. CMP was remarkable only for elevated glucose with normal BUN and creatinine. CBC was normal. Labs were drawn during last emergency room visit 08/18/2016.  Review of Systems: Patient complains of symptoms per HPI as well as the following symptoms: no CP, no SOB. Pertinent negatives per HPI. All others negative.   Social History   Socioeconomic History  . Marital status: Married    Spouse name: Not on file  . Number of children: Not on file  . Years of education: Not on file  . Highest education level: Not on file  Occupational History  . Occupation: [employer]  Social Needs  . Financial resource strain: Not on file  . Food insecurity:    Worry: Not on file    Inability: Not on file  . Transportation needs:    Medical: Not on file    Non-medical: Not on file  Tobacco Use  . Smoking status: Never Smoker  . Smokeless tobacco: Never Used  Substance and Sexual Activity  . Alcohol use: No    Alcohol/week: 0.0 standard drinks    Comment: socially  . Drug use: No  . Sexual activity: Not on file  Lifestyle  . Physical activity:    Days per week: Not on file    Minutes per session: Not on file  . Stress: Not on file  Relationships  . Social connections:    Talks on phone: Not on file    Gets together: Not on file    Attends religious service: Not on file    Active member of club or organization: Not on file    Attends meetings of clubs or organizations: Not on file    Relationship status: Not on file  . Intimate partner violence:    Fear of current or ex partner: Not on file    Emotionally abused: Not on file    Physically abused: Not on file    Forced sexual activity: Not on file  Other Topics Concern  . Not on file  Social History Narrative  . Not on file     Family History  Problem Relation Age of Onset  . Diabetes Mother   . Hyperlipidemia Mother   . Hypertension Mother   . Stroke Mother   . Lung cancer Father   . Hearing loss Maternal Grandmother   . Hearing loss Paternal Grandmother   . Hearing loss Paternal Grandfather     Past Medical History:  Diagnosis Date  . Abdominal wall hernia   . Adenomatous colon polyp   . Anxiety   . CAD (coronary artery disease)   . Carpal tunnel syndrome   . Diabetes mellitus without complication ([place])   . Diverticulosis   . DVT (deep venous thrombosis) ([place])   . Esophageal stricture   . GERD (gastroesophageal reflux disease)   . Hepatic steatosis   . Hyperlipidemia   . Hypertension   . Internal hemorrhoids   . Ovarian cyst   . Shingles  Past Surgical History:  Procedure Laterality Date  . ANKLE SURGERY     left ankle ligament repair  . BREAST SURGERY     biopsy  . CHOLECYSTECTOMY    . JOINT REPLACEMENT  2008    Current Outpatient Medications  Medication Sig Dispense Refill  . aspirin 81 MG chewable tablet Chew 81 mg by mouth every morning.    Marland Kitchen atorvastatin (LIPITOR) 20 MG tablet Take 20 mg by mouth every evening.     . cyclobenzaprine (FLEXERIL) 10 MG tablet Take 0.5-1 tablets (5-10 mg total) by mouth 2 (two) times daily as needed for muscle spasms. 20 tablet 0  . FLUoxetine (PROZAC) 20 MG capsule TAKE 2 CAPSULES BY MOUTH ONCE DAILY.  MAY TAKE AN ADDITIONAL CAPSULE DAILY IF NEEDED 90 capsule 0  . losartan-hydrochlorothiazide (HYZAAR) 100-25 MG per tablet Take 1 tablet by mouth every morning.     . meloxicam (MOBIC) 7.5 MG tablet Take 1 tablet (7.5 mg total) by mouth daily. 30 tablet 0  . metFORMIN (GLUCOPHAGE-XR) 500 MG 24 hr tablet Take 500 mg by mouth 2 (two) times daily.    . Omega-3 Fatty Acids (FISH OIL PO) Take by mouth.    . Pantoprazole Sodium (PROTONIX PO) Take by mouth 2 (two) times daily.    . SUMAtriptan (IMITREX) 100 MG tablet Take 1 tablet (100 mg total) by  mouth once as needed for up to 1 dose. May repeat in 2 hours if headache persists or recurs. Max 2 tablets in 24 hours. 10 tablet 1  . therapeutic multivitamin-minerals (THERAGRAN-M) tablet Take 1 tablet by mouth daily.    Marland Kitchen tiZANidine (ZANAFLEX) 4 MG tablet Take 1 tablet (4 mg total) by mouth every 8 (eight) hours as needed for muscle spasms. 90 tablet 6   No current facility-administered medications for this visit.     Allergies as of 05/14/2018 - Review Complete 02/16/2017  Allergen Reaction Noted  . Erythromycin  10/12/2013  . Penicillins  09/07/2012  . Tramadol  09/26/2016    Vitals: There were no vitals taken for this visit. Last Weight:  Wt Readings from Last 1 Encounters:  01/30/17 153 lb (69.4 kg)   Last Height:   Ht Readings from Last 1 Encounters:  01/30/17 5\' 3"  (1.6 m)    Physical exam: Exam: Gen: NAD, conversant, well nourised, obese, well groomed                     CV: RRR, no MRG. No Carotid Bruits. No peripheral edema, warm, nontender Eyes: Conjunctivae clear without exudates or hemorrhage  Neuro: Detailed Neurologic Exam  Speech:    Speech is normal; fluent and spontaneous with normal comprehension.  Cognition:    The patient is oriented to person, place, and time;     recent and remote memory intact;     language fluent;     normal attention, concentration,     fund of knowledge Cranial Nerves:    The pupils are equal, round, and reactive to light. The fundi are normal and spontaneous venous pulsations are present. Visual fields are full to finger confrontation. Extraocular movements are intact. Trigeminal sensation is intact and the muscles of mastication are normal. The face is symmetric. The palate elevates in the midline. Hearing intact. Voice is normal. Shoulder shrug is normal. The tongue has normal motion without fasciculations.   Motor Observation:    No asymmetry, no atrophy, and no involuntary movements noted. Tone:    Normal muscle tone.  Posture:    Posture is normal. normal erect    Strength:    Strength is V/V in the upper and lower limbs.      Sensation: intact to LT          Assessment/Plan:    [age]-year-old patient with migraines associated with left-sided weakness and sensory changes. Workup has been negative to date for stroke or other etiologies and MRI of the brain in the past normal for age. She is worsening in the setting of stress and family death but prior was doing well.  She is under stress. She is uninsured right now, gave 2 aimovig samples and she will email and let us know how htings are going whether she wants to continue with a few more samples or once the holidays are over she feels better.  Refill meds.  Keep me updated via email on progress Imitrex: Please take one tablet at the onset of your headache. If it does not improve the symptoms please take one additional tablet in 2 hours. Do not take more then 2 tablets in 24hrs. Do not take use more then 2 to 3 times in a week.\   In the past referred in the past to [facility] and see which one better for insurance: PT, Massage/manual therapy, stretching, strengthening, TENs, dry needling for cervicalgia, musculoskeletal neck pain. migrianes  Meds ordered this encounter  Medications  . Erenumab-aooe (AIMOVIG) 140 MG/ML SOAJ    Sig: Inject 140 mg into the skin every 30 (thirty) days.    Dispense:  2 pen    Refill:  0    Order Specific Question:   Lot Number?    Answer:   [id number]    Order Specific Question:   Expiration Date?    Answer:   08/28/2019    Order Specific Question:   Manufacturer?    Answer:   [employer] [[id number]]    Order Specific Question:   [place]    Answer:   26203-559-74 [[id number]]    Order Specific Question:   Quantity    Answer:   2  . FLUoxetine (PROZAC) 20 MG capsule    Sig: TAKE 3 CAPSULES BY MOUTH ONCE DAILY.    Dispense:  90 capsule    Refill:  0  . SUMAtriptan (IMITREX) 100 MG tablet    Sig: Take  1 tablet (100 mg total) by mouth once as needed for up to 1 dose. Max 2 tablets in 24 hours.    Dispense:  10 tablet    Refill:  11  . tiZANidine (ZANAFLEX) 4 MG tablet    Sig: Take 1 tablet (4 mg total) by mouth every 8 (eight) hours as needed for muscle spasms.    Dispense:  90 tablet    Refill:  Anne [place], MD  [facility] [address] [place], [place] [address]  Phone [phone] Fax [phone]  A total of 25 minutes was spent face-to-face with this patient. Over half this time was spent on counseling patient on the  1. Migraine without aura and without status migrainosus, not intractable   2. Complicated migraine    diagnosis and different diagnostic and therapeutic options available.

## 2018-05-17 ENCOUNTER — Other Ambulatory Visit: Payer: Self-pay | Admitting: Neurology

## 2018-05-17 DIAGNOSIS — G43009 Migraine without aura, not intractable, without status migrainosus: Secondary | ICD-10-CM

## 2018-06-12 ENCOUNTER — Other Ambulatory Visit: Payer: Self-pay | Admitting: Neurology

## 2018-06-12 DIAGNOSIS — G43009 Migraine without aura, not intractable, without status migrainosus: Secondary | ICD-10-CM

## 2019-03-18 ENCOUNTER — Ambulatory Visit (INDEPENDENT_AMBULATORY_CARE_PROVIDER_SITE_OTHER): Payer: PRIVATE HEALTH INSURANCE

## 2019-03-18 ENCOUNTER — Ambulatory Visit (INDEPENDENT_AMBULATORY_CARE_PROVIDER_SITE_OTHER): Payer: PRIVATE HEALTH INSURANCE | Admitting: Podiatry

## 2019-03-18 ENCOUNTER — Encounter: Payer: Self-pay | Admitting: Podiatry

## 2019-03-18 ENCOUNTER — Other Ambulatory Visit: Payer: Self-pay

## 2019-03-18 VITALS — BP 146/88 | HR 93 | Resp 16

## 2019-03-18 DIAGNOSIS — M79671 Pain in right foot: Secondary | ICD-10-CM | POA: Diagnosis not present

## 2019-03-18 DIAGNOSIS — R2241 Localized swelling, mass and lump, right lower limb: Secondary | ICD-10-CM | POA: Diagnosis not present

## 2019-03-18 DIAGNOSIS — M779 Enthesopathy, unspecified: Secondary | ICD-10-CM | POA: Diagnosis not present

## 2019-03-18 DIAGNOSIS — M722 Plantar fascial fibromatosis: Secondary | ICD-10-CM | POA: Diagnosis not present

## 2019-03-18 MED ORDER — MELOXICAM 15 MG PO TABS: 15.0000 mg | ORAL_TABLET | Freq: Every day | ORAL | 0 refills | Status: AC

## 2019-03-18 NOTE — Progress Notes (Signed)
Subjective:   Patient ID: Karen Scott, female   DOB: 63 y.o.   MRN: [id number]   HPI 63 year old female presents the office today for concerns of right foot pain.  She still has been on the fourth 2 months of gradual getting worse and she is been to the point where is hurting on a daily basis with walking.  She has noticed a minimal swelling but no redness.  No recent injury and she has had no recent treatment otherwise.  She points on the medial aspect of foot on the arch where she is majority of tenderness, pain.  She is diabetic but she does not check her blood sugar and she did her last A1c.  No radiating pain or weakness.   Review of Systems  All other systems reviewed and are negative.  Past Medical History:  Diagnosis Date  . Abdominal wall hernia   . Adenomatous colon polyp   . Anxiety   . CAD (coronary artery disease)   . Carpal tunnel syndrome   . Diabetes mellitus without complication ([place])   . Diverticulosis   . DVT (deep venous thrombosis) ([place])   . Esophageal stricture   . GERD (gastroesophageal reflux disease)   . Hepatic steatosis   . Hyperlipidemia   . Hypertension   . Internal hemorrhoids   . Ovarian cyst   . Shingles     Past Surgical History:  Procedure Laterality Date  . ANKLE SURGERY     left ankle ligament repair  . BREAST SURGERY     biopsy  . CHOLECYSTECTOMY    . JOINT REPLACEMENT  2008     Current Outpatient Medications:  .  aspirin 81 MG chewable tablet, Chew 81 mg by mouth every morning., Disp: , Rfl:  .  atorvastatin (LIPITOR) 20 MG tablet, Take 20 mg by mouth every evening. , Disp: , Rfl:  .  FLUoxetine (PROZAC) 20 MG capsule, TAKE THREE CAPSULES BY MOUTH DAILY, Disp: 90 capsule, Rfl: 5 .  losartan-hydrochlorothiazide (HYZAAR) 100-25 MG per tablet, Take 1 tablet by mouth every morning. , Disp: , Rfl:  .  metFORMIN (GLUCOPHAGE-XR) 500 MG 24 hr tablet, Take 500 mg by mouth 2 (two) times daily., Disp: , Rfl:  .  Omega-3 Fatty Acids (FISH OIL  PO), Take by mouth., Disp: , Rfl:  .  Pantoprazole Sodium (PROTONIX PO), Take by mouth 2 (two) times daily., Disp: , Rfl:  .  SUMAtriptan (IMITREX) 100 MG tablet, Take 1 tablet (100 mg total) by mouth once as needed for up to 1 dose. Max 2 tablets in 24 hours., Disp: 10 tablet, Rfl: 11 .  therapeutic multivitamin-minerals (THERAGRAN-M) tablet, Take 1 tablet by mouth daily., Disp: , Rfl:   Allergies  Allergen Reactions  . Erythromycin     All in this class  . Penicillins     All "myocins" : unknown Has patient had a PCN reaction causing immediate rash, facial/tongue/throat swelling, SOB or lightheadedness with hypotension: n/a Has patient had a PCN reaction causing severe rash involving mucus membranes or skin necrosis: n/a Has patient had a PCN reaction that required hospitalization: n/a Has patient had a PCN reaction occurring within the last 10 years: n/a If all of the above answers are "NO", then may proceed with Cephalosporin use.   . Tramadol         Objective:  Physical Exam  General: AAO x3, NAD  Dermatological: Skin is warm, dry and supple bilateral. Nails x 10 are well manicured; remaining  integument appears unremarkable at this time. There are no open sores, no preulcerative lesions, no rash or signs of infection present.  Vascular: Dorsalis Pedis artery and Posterior Tibial artery pedal pulses are 2/4 bilateral with immedate capillary fill time. Pedal hair growth present. No varicosities and no lower extremity edema present bilateral. There is no pain with calf compression, swelling, warmth, erythema.   Neruologic: Grossly intact via light touch bilateral. Vibratory intact via tuning fork bilateral. Protective threshold with Semmes Wienstein monofilament intact to all pedal sites bilateral.  Negative Tinel sign.  Musculoskeletal: There is tenderness to palpation on the right foot along medial aspect of foot this appears to be more on the insertion of the tibialis anterior  tendon.  Mild is, in the first metatarsal cuneiform joint extending to the medial aspect of foot.  Flatfoot is present.  MMT 5/5.  Appears there is no obvious rupture of the tibialis anterior tendon today.  Able to palpate the borders.  No erythema or warmth.  Muscular strength 5/5 in all groups tested bilateral.  Gait: Unassisted, Nonantalgic.       Assessment:   63 year old female right tibials anterior tendonitis, concern for partial tear    Plan:  -Treatment options discussed including all alternatives, risks, and complications -Etiology of symptoms were discussed -X-rays were obtained and reviewed with the patient. No evidence of acute fracture identified.  Mild arthritic changes present. -Discussed immobilization in a Tri-Lock.  She has a cam boot at home which she is going to wear. -Prescribed mobic. Discussed side effects of the medication and directed to stop if any are to occur and call the office.  -Compression anklet dispensed as well as ice pack. -If no improvement will order MRI.  She was told up until January if possible however if symptoms not improving will need those before.  Trula Slade DPM

## 2019-04-17 ENCOUNTER — Ambulatory Visit: Payer: PRIVATE HEALTH INSURANCE | Admitting: Podiatry

## 2019-04-29 ENCOUNTER — Other Ambulatory Visit: Payer: Self-pay | Admitting: Neurology

## 2019-04-29 DIAGNOSIS — G43009 Migraine without aura, not intractable, without status migrainosus: Secondary | ICD-10-CM

## 2019-06-06 ENCOUNTER — Other Ambulatory Visit: Payer: Self-pay | Admitting: Neurology

## 2019-06-06 DIAGNOSIS — G43009 Migraine without aura, not intractable, without status migrainosus: Secondary | ICD-10-CM

## 2019-11-13 ENCOUNTER — Other Ambulatory Visit: Payer: Self-pay | Admitting: Neurology

## 2019-11-13 DIAGNOSIS — G43009 Migraine without aura, not intractable, without status migrainosus: Secondary | ICD-10-CM

## 2019-12-22 ENCOUNTER — Telehealth: Payer: Self-pay | Admitting: Neurology

## 2019-12-22 NOTE — Telephone Encounter (Signed)
Patient has not been seen here since 04/2018. She states she is needing refills of her medications. I offered her 2 different appointments with Megan and Amy but they did not work with her schedule since she is self-employed. I let her know that I would send a message to RN & MD about refilling her medications but also let her know that she may need an appointment before she can get a refill. Please advise.

## 2019-12-23 NOTE — Telephone Encounter (Signed)
I called the pt and LVM (Ok per [facility]) asking for call back to discuss refills and also to schedule an appt. I left the office number in the message. Also let pt know she likely doesn't have refills left of the Imitrex but the Fluoxetine does have refills.

## 2020-02-12 ENCOUNTER — Telehealth: Payer: Self-pay | Admitting: Neurology

## 2020-02-12 DIAGNOSIS — G43009 Migraine without aura, not intractable, without status migrainosus: Secondary | ICD-10-CM

## 2020-02-12 MED ORDER — FLUOXETINE HCL 20 MG PO CAPS: 60.0000 mg | ORAL_CAPSULE | Freq: Every day | ORAL | 0 refills | Status: DC

## 2020-02-12 NOTE — Telephone Encounter (Signed)
Pt is requesting a refill for FLUoxetine (PROZAC) 20 MG capsule  to .  Pharmacy: [facility] [phone]

## 2020-02-12 NOTE — Addendum Note (Signed)
Addended by: Gildardo Griffes on: 02/12/2020 04:55 PM   Modules accepted: Orders

## 2020-02-12 NOTE — Telephone Encounter (Signed)
I called [facility]. They confirmed there are no refills left for Fluoxetine. The patient has a pending appt with Amy NP 05/2020. I sent in a refill of the medication.

## 2020-03-12 ENCOUNTER — Other Ambulatory Visit: Payer: Self-pay | Admitting: Neurology

## 2020-03-12 DIAGNOSIS — G43009 Migraine without aura, not intractable, without status migrainosus: Secondary | ICD-10-CM

## 2020-05-31 ENCOUNTER — Ambulatory Visit (INDEPENDENT_AMBULATORY_CARE_PROVIDER_SITE_OTHER): Payer: Medicare Other | Admitting: Family Medicine

## 2020-05-31 ENCOUNTER — Encounter: Payer: Self-pay | Admitting: Family Medicine

## 2020-05-31 VITALS — BP 145/89 | HR 99 | Ht 63.0 in | Wt 162.0 lb

## 2020-05-31 DIAGNOSIS — G43009 Migraine without aura, not intractable, without status migrainosus: Secondary | ICD-10-CM | POA: Diagnosis not present

## 2020-05-31 MED ORDER — RIZATRIPTAN BENZOATE 10 MG PO TBDP: 10.0000 mg | ORAL_TABLET | ORAL | 11 refills | Status: AC | PRN

## 2020-05-31 MED ORDER — FLUOXETINE HCL 20 MG PO CAPS: 60.0000 mg | ORAL_CAPSULE | Freq: Every day | ORAL | 3 refills | Status: AC

## 2020-05-31 NOTE — Progress Notes (Addendum)
Chief Complaint  Patient presents with  . Follow-up    Rm 1, alone, pt states she is doing well      HISTORY OF PRESENT ILLNESS: Today 05/31/20  Karen Scott is a 65 y.o. female here today for follow up for migraines. Last seen 04/2018. She has continued fluoxetine 60mg  daily and sumatriptan for abortive therapy. She reports that headaches are much better. She feels that stress levels are better which helps. She also notes that eating patterns are better which helps. She may have 2-3 headache days per month. She feels that the sumatriptan does not work well and makes her really groggy. She has never tried other triptans. She is seen by PCP annually.    HISTORY (copied from previous note)  Interval history 05/14/2018:  Since last appointment her mother passed away and she is seeing a grief counselor. She is having 608 migraines a week. She is not doing well. Lots of stress. A dark quiet place helps. She is under stress. She is uninsured right now, gave 2 aimovig samples and she will email and let us know how htings are going whether she wants to continue with a few more samples or once the holidays are over she feels better.  Meds tried; Wellbutrin, prozac, topamax, amitriptyline, propranolol, flexeril  Interval history 01/30/2017: Patient returns for follow up, imitrex works very well. Discussed prozac which is helping, will increase dose. She has lost 12 pounds, is trying to de-stress and take better care of herself. Could add on Wellbutrin as well if needed. Much improved.  [id number] B Davisis a 65 y.o.femalehere as a referral from ED for Migraine associated with left sided sensory changes and weakness. . She has a hx of migraines since a teenager. She has a past medical history of hypertension, diabetes, hyperlipidemia, coronary artery disease. She's had several episodes of left sided symptoms initially thought to be CVA and now diagnosed as "complicated migraine". In 2014 the  sun was glistening of the water and she was getting a headache and her face started twitching, they spent one day in [place] in the ED. On the CAT scan they may have seen a stroke in the past but subsequent workup with MRI was negative. In 2016 she had another episode that was similar. A few weeks ago had another episode and her face got numb and lips tingling. The next day symptoms were worse and she went to the ED. She had a paradoxical rxn to Phenergan. The headaches on the left side, throbbing, behind the left eye, painful, her face will go numb and her face will twitch with left-sided weakness. No aura. No medication overuse. Endorses nausea no vomiting. Stressful events are triggers. Dark room and quiet makes it better. These have been some stressful events. Mother with migraines. She has half the month of headaches(15/30) and. 8-9 are migrainous in the setting of recent stress. No other focal neurologic deficits, associated symptoms, inciting events or modifiable factors. No aura.     Reviewed notes, labs and imaging from outside physicians, which showed:  Reviewed emergency room visits. Patient was seen on March 23 presented with left-sided facial numbness. There was persistent since the previous night also left sided headache behind the eye with a dull pain. She also reported some weakness in the left side and some changes in sensation. Has had episodes like this before. Review of exam showed that there was slightly decreased grip strength on the left compared to the right. There is  also some slightly decreased in the left face and upper lower and mid fields. She did split in the midline. Otherwise exam unremarkable. Patient has similar symptoms 2 years previous and was worked up, negative MRI for infarct at that time. Her headaches improved with migraine treatment. She did have echo this he had versus dystonic reaction to Phenergan, became anxious and started pacing the room. Given Benadryl.  Reviewed all past emergency room visits, significant for a visit in 2014 with similar symptoms left facial dysesthesias, visual changes in the setting of migraine with intermittent left eye visual blurriness, posterior left head pain. At that time patient said that she had a similar symptom a year previous and told she had a possible stroke however CT scan was negative and subsequent follow-up with neurology was inconclusive (so would not say that stroke is a diagnosis of this patient).Imaging at that time was unremarkable and she improved with migraine cocktail/migraine treatment.  Personally reviewed MRI images from June 2014 which showed normal for age brain.  Reviewed labs. CMP was remarkable only for elevated glucose with normal BUN and creatinine. CBC was normal. Labs were drawn during last emergency room visit 08/18/2016.   REVIEW OF SYSTEMS: Out of a complete 14 system review of symptoms, the patient complains only of the following symptoms, headaches and all other reviewed systems are negative.   ALLERGIES: Allergies  Allergen Reactions  . Erythromycin     All in this class  . Penicillins     All "myocins" : unknown Has patient had a PCN reaction causing immediate rash, facial/tongue/throat swelling, SOB or lightheadedness with hypotension: n/a Has patient had a PCN reaction causing severe rash involving mucus membranes or skin necrosis: n/a Has patient had a PCN reaction that required hospitalization: n/a Has patient had a PCN reaction occurring within the last 10 years: n/a If all of the above answers are "NO", then may proceed with Cephalosporin use.   . Tramadol      HOME MEDICATIONS: Outpatient Medications Prior to Visit  Medication Sig Dispense Refill  . aspirin 81 MG chewable tablet Chew 81 mg by mouth every morning.    Marland Kitchen atorvastatin (LIPITOR) 20 MG tablet Take 20 mg by mouth every evening.     Marland Kitchen losartan-hydrochlorothiazide (HYZAAR) 100-25 MG per tablet Take 1  tablet by mouth every morning.     . metFORMIN (GLUCOPHAGE-XR) 500 MG 24 hr tablet Take 500 mg by mouth 2 (two) times daily.    . Omega-3 Fatty Acids (FISH OIL PO) Take by mouth.    . Pantoprazole Sodium (PROTONIX PO) Take by mouth 2 (two) times daily.    Marland Kitchen therapeutic multivitamin-minerals (THERAGRAN-M) tablet Take 1 tablet by mouth daily.    Marland Kitchen FLUoxetine (PROZAC) 20 MG capsule TAKE THREE CAPSULES BY MOUTH DAILY 90 capsule 2  . SUMAtriptan (IMITREX) 100 MG tablet Take 1 tablet (100 mg total) by mouth once as needed for up to 1 dose. Max 2 tablets in 24 hours. 10 tablet 11   No facility-administered medications prior to visit.     PAST MEDICAL HISTORY: Past Medical History:  Diagnosis Date  . Abdominal wall hernia   . Adenomatous colon polyp   . Anxiety   . CAD (coronary artery disease)   . Carpal tunnel syndrome   . Diabetes mellitus without complication (HCC)   . Diverticulosis   . DVT (deep venous thrombosis) (HCC)   . Esophageal stricture   . GERD (gastroesophageal reflux disease)   . Hepatic steatosis   .  Hyperlipidemia   . Hypertension   . Internal hemorrhoids   . Ovarian cyst   . Shingles      PAST SURGICAL HISTORY: Past Surgical History:  Procedure Laterality Date  . ANKLE SURGERY     left ankle ligament repair  . BREAST SURGERY     biopsy  . CHOLECYSTECTOMY    . JOINT REPLACEMENT  2008     FAMILY HISTORY: Family History  Problem Relation Age of Onset  . Diabetes Mother   . Hyperlipidemia Mother   . Hypertension Mother   . Stroke Mother   . Lung cancer Father   . Hearing loss Maternal Grandmother   . Hearing loss Paternal Grandmother   . Hearing loss Paternal Grandfather      SOCIAL HISTORY: Social History   Socioeconomic History  . Marital status: Widowed    Spouse name: Not on file  . Number of children: 2  . Years of education: Not on file  . Highest education level: Some college, no degree  Occupational History  . Occupation: [employer]  Tobacco Use  . Smoking status: Never Smoker  . Smokeless tobacco: Never Used  Vaping Use  . Vaping Use: Never used  Substance and Sexual Activity  . Alcohol use: No    Alcohol/week: 0.0 standard drinks    Comment: socially  . Drug use: No  . Sexual activity: Not on file  Other Topics Concern  . Not on file  Social History Narrative   Lives at home with her son   Right handed   Caffeine: 2-3 cups in the mornings   Social Determinants of Health   Financial Resource Strain: Not on file  Food Insecurity: Not on file  Transportation Needs: Not on file  Physical Activity: Not on file  Stress: Not on file  Social Connections: Not on file  Intimate Partner Violence: Not on file      PHYSICAL EXAM  Vitals:   05/31/20 1036  BP: (!) 145/89  Pulse: 99  Weight: 162 lb (73.5 kg)  Height: 5\' 3"  (1.6 m)   Body mass index is 28.7 kg/m.   Generalized: Well developed, in no acute distress  Cardiology: normal rate and rhythm, no murmur auscultated  Respiratory: clear to auscultation bilaterally    Neurological examination  Mentation: Alert oriented to time, place, history taking. Follows all commands speech and language fluent Cranial nerve II-XII: Pupils were equal round reactive to light. Extraocular movements were full, visual field were full  Motor: The motor testing reveals 5 over 5 strength of all 4 extremities. Good symmetric motor tone is noted throughout.  Gait and station: Gait is normal.     DIAGNOSTIC DATA (LABS, IMAGING, TESTING) - I reviewed patient records, labs, notes, testing and imaging myself where available.  Lab Results  Component Value Date   WBC 8.3 08/18/2016   HGB 13.4 08/18/2016   HCT 39.6 08/18/2016   MCV 83.5 08/18/2016   PLT 229 08/18/2016      Component Value Date/Time   NA 137 08/18/2016 2110   K 3.2 (L) 08/18/2016 2110   CL 102 08/18/2016 2110   CO2 28 08/18/2016 2110   GLUCOSE 157 (H) 08/18/2016 2110   BUN  18 08/18/2016 2110   CREATININE 0.98 08/18/2016 2110   CALCIUM 9.5 08/18/2016 2110   PROT 7.4 08/18/2016 2110   ALBUMIN 4.1 08/18/2016 2110   AST 23 08/18/2016 2110   ALT 20 08/18/2016 2110   ALKPHOS 63 08/18/2016 2110  BILITOT 0.5 08/18/2016 2110   GFRNONAA >60 08/18/2016 2110   GFRAA >60 08/18/2016 2110   Lab Results  Component Value Date   CHOL  05/06/2009    81        ATP III CLASSIFICATION:  <200     mg/dL   Desirable  200-239  mg/dL   Borderline High  >=240    mg/dL   High          HDL 37 (L) 05/06/2009   LDLCALC  05/06/2009    15        Total Cholesterol/HDL:CHD Risk Coronary Heart Disease Risk Table                     Men   Women  1/2 Average Risk   3.4   3.3  Average Risk       5.0   4.4  2 X Average Risk   9.6   7.1  3 X Average Risk  23.4   11.0        Use the calculated Patient Ratio above and the CHD Risk Table to determine the patient's CHD Risk.        ATP III CLASSIFICATION (LDL):  <100     mg/dL   Optimal  100-129  mg/dL   Near or Above                    Optimal  130-159  mg/dL   Borderline  160-189  mg/dL   High  >190     mg/dL   Very High   TRIG 145 05/06/2009   CHOLHDL 2.2 05/06/2009   Lab Results  Component Value Date   HGBA1C (H) 05/06/2009    6.8 (NOTE) The ADA recommends the following therapeutic goal for glycemic control related to Hgb A1c measurement: Goal of therapy: <6.5 Hgb A1c  Reference: American Diabetes Association: Clinical Practice Recommendations 2010, Diabetes Care, 2010, 33: (Suppl  1).   No results found for: VITAMINB12   No flowsheet data found.   ASSESSMENT AND PLAN  65 y.o. year old female  has a past medical history of Abdominal wall hernia, Adenomatous colon polyp, Anxiety, CAD (coronary artery disease), Carpal tunnel syndrome, Diabetes mellitus without complication ([place]), Diverticulosis, DVT (deep venous thrombosis) ([place]), Esophageal stricture, GERD (gastroesophageal reflux disease), Hepatic steatosis,  Hyperlipidemia, Hypertension, Internal hemorrhoids, Ovarian cyst, and Shingles. here with   Migraine without aura and without status migrainosus, not intractable - Plan: FLUoxetine (PROZAC) 20 MG capsule  She is doing well on fluoxetine 60mg  daily. We will continue current treatment plan. I will try rizatriptan for abortive therapy. She was advised to call if this does not work well and we can try to get CGRP covered by insurance. She will stay well hydrated, focus on healthy diet and sleep patterns. She may follow up with PCP for continued refills. She may call us as needed.   No orders of the defined types were placed in this encounter.     I spent 20 minutes of face-to-face and non-face-to-face time with patient.  This included previsit chart review, lab review, study review, order entry, electronic health record documentation, patient education.    Debbora Presto, MSN, FNP-C 05/31/2020, 10:59 AM  [facility] [address] [place], [place] [address] [phone]   Made any corrections needed, and agree with history, physical, neuro exam,assessment and plan as stated.     Sarina Ill, MD [facility]

## 2020-05-31 NOTE — Patient Instructions (Signed)
Below is our plan:  We will continue fluoxetine 60mg  daily. I will discontinue sumatriptan and we will try rizatriptan. If this does not work well for you please call to let me know. We can try to get newer agents covered if needed.   Please make sure you are staying well hydrated. I recommend 50-60 ounces daily. Well balanced diet and regular exercise encouraged.    Please continue follow up with care team as directed.   Follow up: You may follow up with PCP for refills, if willing. If needed, see [place] annually.   You may receive a survey regarding today's visit. I encourage you to leave honest feed back as I do use this information to improve patient care. Thank you for seeing me today!      Migraine Headache A migraine headache is a very strong throbbing pain on one side or both sides of your head. This type of headache can also cause other symptoms. It can last from 4 hours to 3 days. Talk with your doctor about what things may bring on (trigger) this condition. What are the causes? The exact cause of this condition is not known. This condition may be triggered or caused by:  Drinking alcohol.  Smoking.  Taking medicines, such as: ? Medicine used to treat chest pain (nitroglycerin). ? Birth control pills. ? Estrogen. ? Some blood pressure medicines.  Eating or drinking certain products.  Doing physical activity. Other things that may trigger a migraine headache include:  Having a menstrual period.  Pregnancy.  Hunger.  Stress.  Not getting enough sleep or getting too much sleep.  Weather changes.  Tiredness (fatigue). What increases the risk?  Being 83-73 years old.  Being female.  Having a family history of migraine headaches.  Being Caucasian.  Having depression or anxiety.  Being very overweight. What are the signs or symptoms?  A throbbing pain. This pain may: ? Happen in any area of the head, such as on one side or both sides. ? Make it hard  to do daily activities. ? Get worse with physical activity. ? Get worse around bright lights or loud noises.  Other symptoms may include: ? Feeling sick to your stomach (nauseous). ? Vomiting. ? Dizziness. ? Being sensitive to bright lights, loud noises, or smells.  Before you get a migraine headache, you may get warning signs (an aura). An aura may include: ? Seeing flashing lights or having blind spots. ? Seeing bright spots, halos, or zigzag lines. ? Having tunnel vision or blurred vision. ? Having numbness or a tingling feeling. ? Having trouble talking. ? Having weak muscles.  Some people have symptoms after a migraine headache (postdromal phase), such as: ? Tiredness. ? Trouble thinking (concentrating). How is this treated?  Taking medicines that: ? Relieve pain. ? Relieve the feeling of being sick to your stomach. ? Prevent migraine headaches.  Treatment may also include: ? Having acupuncture. ? Avoiding foods that bring on migraine headaches. ? Learning ways to control your body functions (biofeedback). ? Therapy to help you know and deal with negative thoughts (cognitive behavioral therapy). Follow these instructions at home: Medicines  Take over-the-counter and prescription medicines only as told by your doctor.  Ask your doctor if the medicine prescribed to you: ? Requires you to avoid driving or using heavy machinery. ? Can cause trouble pooping (constipation). You may need to take these steps to prevent or treat trouble pooping:  Drink enough fluid to keep your pee (urine) pale  yellow.  Take over-the-counter or prescription medicines.  Eat foods that are high in fiber. These include beans, whole grains, and fresh fruits and vegetables.  Limit foods that are high in fat and sugar. These include fried or sweet foods. Lifestyle  Do not drink alcohol.  Do not use any products that contain nicotine or tobacco, such as cigarettes, e-cigarettes, and chewing  tobacco. If you need help quitting, ask your doctor.  Get at least 8 hours of sleep every night.  Limit and deal with stress. General instructions      Keep a journal to find out what may bring on your migraine headaches. For example, write down: ? What you eat and drink. ? How much sleep you get. ? Any change in what you eat or drink. ? Any change in your medicines.  If you have a migraine headache: ? Avoid things that make your symptoms worse, such as bright lights. ? It may help to lie down in a dark, quiet room. ? Do not drive or use heavy machinery. ? Ask your doctor what activities are safe for you.  Keep all follow-up visits as told by your doctor. This is important. Contact a doctor if:  You get a migraine headache that is different or worse than others you have had.  You have more than 15 headache days in one month. Get help right away if:  Your migraine headache gets very bad.  Your migraine headache lasts longer than 72 hours.  You have a fever.  You have a stiff neck.  You have trouble seeing.  Your muscles feel weak or like you cannot control them.  You start to lose your balance a lot.  You start to have trouble walking.  You pass out (faint).  You have a seizure. Summary  A migraine headache is a very strong throbbing pain on one side or both sides of your head. These headaches can also cause other symptoms.  This condition may be treated with medicines and changes to your lifestyle.  Keep a journal to find out what may bring on your migraine headaches.  Contact a doctor if you get a migraine headache that is different or worse than others you have had.  Contact your doctor if you have more than 15 headache days in a month. This information is not intended to replace advice given to you by your health care provider. Make sure you discuss any questions you have with your health care provider. Document Revised: 09/06/2018 Document Reviewed:  06/27/2018 Elsevier Patient Education  2020 [employer].

## 2020-07-14 DIAGNOSIS — R059 Cough, unspecified: Secondary | ICD-10-CM | POA: Diagnosis not present

## 2020-07-16 ENCOUNTER — Ambulatory Visit (HOSPITAL_COMMUNITY)
Admission: RE | Admit: 2020-07-16 | Discharge: 2020-07-16 | Disposition: A | Discharge status: Home / Self Care | Payer: Medicare Other | Source: Ambulatory Visit | Attending: Pulmonary Disease | Admitting: Pulmonary Disease

## 2020-07-16 ENCOUNTER — Telehealth: Payer: Self-pay

## 2020-07-16 ENCOUNTER — Other Ambulatory Visit: Payer: Self-pay | Admitting: Family

## 2020-07-16 DIAGNOSIS — Z6825 Body mass index (BMI) 25.0-25.9, adult: Secondary | ICD-10-CM | POA: Diagnosis not present

## 2020-07-16 DIAGNOSIS — U071 COVID-19: Secondary | ICD-10-CM

## 2020-07-16 DIAGNOSIS — E663 Overweight: Secondary | ICD-10-CM

## 2020-07-16 DIAGNOSIS — I10 Essential (primary) hypertension: Secondary | ICD-10-CM | POA: Insufficient documentation

## 2020-07-16 DIAGNOSIS — E1165 Type 2 diabetes mellitus with hyperglycemia: Secondary | ICD-10-CM | POA: Diagnosis not present

## 2020-07-16 MED ORDER — SOTROVIMAB 500 MG/8ML IV SOLN
500.0000 mg | Freq: Once | INTRAVENOUS | Status: AC
  Administered 2020-07-16: 500 mg via INTRAVENOUS

## 2020-07-16 MED ORDER — SODIUM CHLORIDE 0.9 % IV SOLN: INTRAVENOUS | Status: DC | PRN

## 2020-07-16 MED ORDER — ALBUTEROL SULFATE HFA 108 (90 BASE) MCG/ACT IN AERS: 2.0000 | INHALATION_SPRAY | Freq: Once | RESPIRATORY_TRACT | Status: DC | PRN

## 2020-07-16 MED ORDER — DIPHENHYDRAMINE HCL 50 MG/ML IJ SOLN: 50.0000 mg | Freq: Once | INTRAMUSCULAR | Status: DC | PRN

## 2020-07-16 MED ORDER — FAMOTIDINE IN NACL 20-0.9 MG/50ML-% IV SOLN: 20.0000 mg | Freq: Once | INTRAVENOUS | Status: DC | PRN

## 2020-07-16 MED ORDER — EPINEPHRINE 0.3 MG/0.3ML IJ SOAJ: 0.3000 mg | Freq: Once | INTRAMUSCULAR | Status: DC | PRN

## 2020-07-16 MED ORDER — METHYLPREDNISOLONE SODIUM SUCC 125 MG IJ SOLR: 125.0000 mg | Freq: Once | INTRAMUSCULAR | Status: DC | PRN

## 2020-07-16 NOTE — Progress Notes (Signed)
I connected by phone with Karen Scott on 07/16/2020 at 11:34 AM to discuss the potential use of a new treatment for mild to moderate COVID-19 viral infection in non-hospitalized patients.  This patient is a 65 y.o. female that meets the FDA criteria for Emergency Use Authorization of COVID monoclonal antibody sotrovimab.  Has a (+) direct SARS-CoV-2 viral test result  Has mild or moderate COVID-19   Is NOT hospitalized due to COVID-19  Is within 10 days of symptom onset  Has at least one of the high risk factor(s) for progression to severe COVID-19 and/or hospitalization as defined in EUA.  Specific high risk criteria : Older age (>/= 67 yo), BMI > 25, Diabetes and Cardiovascular disease or hypertension   I have spoken and communicated the following to the patient or parent/caregiver regarding COVID monoclonal antibody treatment:  1. FDA has authorized the emergency use for the treatment of mild to moderate COVID-19 in adults and pediatric patients with positive results of direct SARS-CoV-2 viral testing who are 3 years of age and older weighing at least 40 kg, and who are at high risk for progressing to severe COVID-19 and/or hospitalization.  2. The significant known and potential risks and benefits of COVID monoclonal antibody, and the extent to which such potential risks and benefits are unknown.  3. Information on available alternative treatments and the risks and benefits of those alternatives, including clinical trials.  4. Patients treated with COVID monoclonal antibody should continue to self-isolate and use infection control measures (e.g., wear mask, isolate, social distance, avoid sharing personal items, clean and disinfect "high touch" surfaces, and frequent handwashing) according to CDC guidelines.   5. The patient or parent/caregiver has the option to accept or refuse COVID monoclonal antibody treatment.  After reviewing this information with the patient, the patient has  agreed to receive one of the available covid 19 monoclonal antibodies and will be provided an appropriate fact sheet prior to infusion.   Mauricio Po, FNP 07/16/2020 11:34 AM

## 2020-07-16 NOTE — Discharge Instructions (Signed)

## 2020-07-16 NOTE — Telephone Encounter (Signed)
Called to discuss with patient about COVID-19 symptoms and the use of one of the available treatments for those with mild to moderate Covid symptoms and at a high risk of hospitalization.  Pt appears to qualify for outpatient treatment due to co-morbid conditions and/or a member of an at-risk group in accordance with the FDA Emergency Use Authorization.    Symptom onset: 07/11/20 Cough,sore throat, headache,fatigue, congestion,diarrhea Vaccinated: Yes Booster? Yes Immunocompromised? No Qualifiers: HTN,DM  Pt. Would like to speak with APP.  Marcello Moores

## 2020-07-16 NOTE — Progress Notes (Signed)
Diagnosis: COVID-19  Physician: Dr. Patrick Wright  Procedure: Covid Infusion Clinic Med: Sotrovimab infusion - Provided patient with sotrovimab fact sheet for patients, parents, and caregivers prior to infusion.   Complications: No immediate complications noted  Discharge: Discharged home    

## 2020-07-16 NOTE — Telephone Encounter (Signed)
Follow up on RN pre-screen. [name] had symptoms started on 2/13 with cough, sore throat, headache, fatigue, congestion and diarrhea. She is vaccinated and boosted. Qualifying risk factors include age, hypertension, diabetes and BMI of 27. Home test positive.  We discussed the risks, benefits and potential financial costs associated with treatment with Sotrovimab and Remdesivir and she wishes to pursue treatment with Sotrovimab.   Karen Scott,   We contacted you because you were recently diagnosed with COVID-19 and may benefit from a new treatment for mild to moderate disease. This treatment helps reduce the chance of being hospitalized. For some patients with medical conditions that may increase the chances of an infection, the treatment also decreases the risk for serious symptoms related to COVID-19.   The Food and Drug Administration (FDA) approved emergency use of a new drug to treat patients with mild to moderate symptoms who have risk factors that could cause severe symptoms related to COVID-19. This new treatment is a monoclonal antibody. It works by attaching like a magnet to the SARS-CoV2 virus (the virus that causes COVID-19) and stops it from infecting more cells in your body. It does not kill the virus, but it prevents it from spreading throughout your body with the hope that it will decrease your symptoms after it is administered.   This new drug is an intravenous (IV) infusion called Sotrovimab that is given over one 30-minute session in our [facility] outpatient infusion clinic. You will need to stay about 60 minutes after the infusion to ensure you are tolerating it well and to watch for any allergic reaction to the medication. More information will be given to you at the time of your appointment.   Important information:  . The potential side effects: 2-4% of recipients experience nausea, vomiting, diarrhea, dizziness, headaches, itching, worsening fevers or chills for around  24 hours. . There have been no serious infusion-related reactions. . Of the more than 3,000 patients who received the infusion, only one had an allergic response that ended once the infusion was stopped. This is why we monitor all of our patients closely for 60 minutes after the infusion.  . The COVID-19 vaccine (including boosters) no longer needs to be delayed after this infusion and can be safely administered after you have completed your isolation period.  . The medication itself is free, but your insurance will be charged an infusion fee. The amount you may owe later varies from insurance to insurance. If you do not have insurance, we can put you in touch with our billing department. Please contact your insurance agent to discuss prior to your appointment if you would like further details about billing specific to your policy. The CMS code is: M43  . If you have been tested outside of a [facility], you MUST bring a copy of your positive test with you the morning of your appointment. You may take a photo of this and upload to your MyChart portal,  have the testing facility fax the result to [phone] or email a copy to [email or web address].    You have been scheduled to receive the monoclonal antibody therapy at [place]:  07/16/20 at 1:30   The address for the infusion clinic site is:   --The GPS address is [address], and the parking is located near the [employer] building where you will see a "COVID19 Infusion" feather banner marking the entrance to parking. (See photos below.)            --  Enter into the 2nd entrance where the "wave, flag banner" is at the road. Turn into this 2nd entrance and immediately turn left or right to park in one of the marked spaces.   --Please stay in your car and call the desk for assistance inside at [phone]. Let us know which space you are in.    --The average time in the department is roughly 90 minutes to two  hours for monoclonal treatment. This includes preparation of the medication, IV start and the required 60-minute monitoring after the infusion.     Should you develop worsening shortness of breath, chest pain or severe breathing problems, please do not wait for this appointment and go to the emergency room for evaluation and treatment instead. You will undergo another oxygen screen before your infusion to ensure this is the best treatment option for you. There is a chance that the best decision may be to send you to the emergency room for evaluation at the time of your appointment.    The day of your visit, you should: Marland Kitchen Get plenty of rest the night before and drink plenty of water. . Eat a light meal/snack before coming and take your medications as prescribed.  . Wear warm, comfortable clothes with a shirt that can roll-up over the elbow (will need IV start).  . Wear a mask.  . Consider bringing an activity to help pass the time.  Karen Piedra, NP 07/16/2020 11:33 AM

## 2020-07-16 NOTE — Progress Notes (Signed)
Patient reviewed Fact Sheet for Patients, Parents, and Caregivers for Emergency Use Authorization (EUA) of sotrovimab for the Treatment of Coronavirus. Patient also reviewed and is agreeable to the estimated cost of treatment. Patient is agreeable to proceed.   

## 2020-10-16 ENCOUNTER — Observation Stay (HOSPITAL_COMMUNITY): Payer: Medicare Other

## 2020-10-16 ENCOUNTER — Emergency Department (HOSPITAL_COMMUNITY): Payer: Medicare Other

## 2020-10-16 ENCOUNTER — Observation Stay (HOSPITAL_COMMUNITY)
Admission: EM | Admit: 2020-10-16 | Discharge: 2020-10-17 | Disposition: A | Discharge status: Home / Self Care | Payer: Medicare Other | Attending: Internal Medicine | Admitting: Internal Medicine

## 2020-10-16 ENCOUNTER — Encounter (HOSPITAL_COMMUNITY): Payer: Self-pay | Admitting: Internal Medicine

## 2020-10-16 ENCOUNTER — Other Ambulatory Visit: Payer: Self-pay

## 2020-10-16 DIAGNOSIS — Z7982 Long term (current) use of aspirin: Secondary | ICD-10-CM | POA: Insufficient documentation

## 2020-10-16 DIAGNOSIS — R0789 Other chest pain: Secondary | ICD-10-CM | POA: Diagnosis present

## 2020-10-16 DIAGNOSIS — I10 Essential (primary) hypertension: Secondary | ICD-10-CM | POA: Diagnosis present

## 2020-10-16 DIAGNOSIS — I2510 Atherosclerotic heart disease of native coronary artery without angina pectoris: Secondary | ICD-10-CM | POA: Insufficient documentation

## 2020-10-16 DIAGNOSIS — R072 Precordial pain: Secondary | ICD-10-CM

## 2020-10-16 DIAGNOSIS — Z7984 Long term (current) use of oral hypoglycemic drugs: Secondary | ICD-10-CM | POA: Insufficient documentation

## 2020-10-16 DIAGNOSIS — E1169 Type 2 diabetes mellitus with other specified complication: Secondary | ICD-10-CM | POA: Diagnosis not present

## 2020-10-16 DIAGNOSIS — Z79899 Other long term (current) drug therapy: Secondary | ICD-10-CM | POA: Diagnosis not present

## 2020-10-16 DIAGNOSIS — Z20822 Contact with and (suspected) exposure to covid-19: Secondary | ICD-10-CM | POA: Insufficient documentation

## 2020-10-16 DIAGNOSIS — R079 Chest pain, unspecified: Secondary | ICD-10-CM | POA: Diagnosis present

## 2020-10-16 DIAGNOSIS — E119 Type 2 diabetes mellitus without complications: Secondary | ICD-10-CM | POA: Diagnosis not present

## 2020-10-16 DIAGNOSIS — E785 Hyperlipidemia, unspecified: Secondary | ICD-10-CM

## 2020-10-16 LAB — HEPATIC FUNCTION PANEL
ALT: 15 U/L (ref 0–44)
AST: 20 U/L (ref 15–41)
Albumin: 3.7 g/dL (ref 3.5–5.0)
Alkaline Phosphatase: 75 U/L (ref 38–126)
Bilirubin, Direct: <0.1 mg/dL (ref 0.0–0.2)
Total Bilirubin: 0.3 mg/dL (ref 0.3–1.2)
Total Protein: 6.9 g/dL (ref 6.5–8.1)

## 2020-10-16 LAB — MAGNESIUM: Magnesium: 1.7 mg/dL (ref 1.7–2.4)

## 2020-10-16 LAB — BASIC METABOLIC PANEL
Anion gap: 8 (ref 5–15)
BUN: 16 mg/dL (ref 8–23)
CO2: 27 mmol/L (ref 22–32)
Calcium: 9.2 mg/dL (ref 8.9–10.3)
Chloride: 99 mmol/L (ref 98–111)
Creatinine, Ser: 0.94 mg/dL (ref 0.44–1.00)
GFR, Estimated: >60 mL/min (ref >60)
Glucose, Bld: 164 mg/dL — ABNORMAL HIGH (ref 70–99)
Potassium: 3.4 mmol/L — ABNORMAL LOW (ref 3.5–5.1)
Sodium: 134 mmol/L — ABNORMAL LOW (ref 135–145)

## 2020-10-16 LAB — CBC
HCT: 38.2 % (ref 36.0–46.0)
Hemoglobin: 13 g/dL (ref 12.0–15.0)
MCH: 28.6 pg (ref 26.0–34.0)
MCHC: 34 g/dL (ref 30.0–36.0)
MCV: 84.1 fL (ref 80.0–100.0)
Platelets: 252 10*3/uL (ref 150–400)
RBC: 4.54 MIL/uL (ref 3.87–5.11)
RDW: 13.2 % (ref 11.5–15.5)
WBC: 7.2 10*3/uL (ref 4.0–10.5)
nRBC: 0 % (ref 0.0–0.2)

## 2020-10-16 LAB — LIPASE, BLOOD: Lipase: 34 U/L (ref 11–51)

## 2020-10-16 LAB — LIPID PANEL
Cholesterol: 93 mg/dL (ref 0–200)
HDL: 24 mg/dL — ABNORMAL LOW (ref >40)
LDL Cholesterol: 50 mg/dL (ref 0–99)
Total CHOL/HDL Ratio: 3.9 RATIO
Triglycerides: 96 mg/dL (ref <150)
VLDL: 19 mg/dL (ref 0–40)

## 2020-10-16 LAB — TROPONIN I (HIGH SENSITIVITY)
Troponin I (High Sensitivity): 4 ng/L (ref <18)
Troponin I (High Sensitivity): 3 ng/L (ref <18)

## 2020-10-16 LAB — D-DIMER, QUANTITATIVE: D-Dimer, Quant: 0.55 ug/mL-FEU — ABNORMAL HIGH (ref 0.00–0.50)

## 2020-10-16 MED ORDER — ONDANSETRON HCL 4 MG/2ML IJ SOLN: 4.0000 mg | Freq: Four times a day (QID) | INTRAMUSCULAR | Status: DC | PRN

## 2020-10-16 MED ORDER — IOHEXOL 350 MG/ML SOLN
100.0000 mL | Freq: Once | INTRAVENOUS | Status: AC | PRN
  Administered 2020-10-16: 100 mL via INTRAVENOUS

## 2020-10-16 MED ORDER — ENOXAPARIN SODIUM 40 MG/0.4ML IJ SOSY
40.0000 mg | PREFILLED_SYRINGE | INTRAMUSCULAR | Status: DC
  Administered 2020-10-16: 40 mg via SUBCUTANEOUS
  Filled 2020-10-16: qty 0.4

## 2020-10-16 MED ORDER — METOPROLOL TARTRATE 5 MG/5ML IV SOLN: 5.0000 mg | Freq: Four times a day (QID) | INTRAVENOUS | Status: DC | PRN

## 2020-10-16 MED ORDER — ACETAMINOPHEN 325 MG PO TABS
650.0000 mg | ORAL_TABLET | ORAL | Status: DC | PRN
  Administered 2020-10-17: 650 mg via ORAL
  Filled 2020-10-16: qty 2

## 2020-10-16 MED ORDER — FLUOXETINE HCL 20 MG PO CAPS
40.0000 mg | ORAL_CAPSULE | Freq: Every day | ORAL | Status: DC
  Administered 2020-10-16 – 2020-10-17 (×2): 40 mg via ORAL
  Filled 2020-10-16 (×2): qty 2

## 2020-10-16 MED ORDER — MORPHINE SULFATE (PF) 2 MG/ML IV SOLN
2.0000 mg | Freq: Once | INTRAVENOUS | Status: AC
  Administered 2020-10-16: 2 mg via INTRAVENOUS
  Filled 2020-10-16: qty 1

## 2020-10-16 MED ORDER — POTASSIUM CHLORIDE CRYS ER 20 MEQ PO TBCR: 20.0000 meq | EXTENDED_RELEASE_TABLET | Freq: Once | ORAL | Status: DC

## 2020-10-16 MED ORDER — NITROGLYCERIN 0.4 MG SL SUBL
0.4000 mg | SUBLINGUAL_TABLET | SUBLINGUAL | Status: DC | PRN
  Administered 2020-10-16 (×4): 0.4 mg via SUBLINGUAL
  Filled 2020-10-16 (×2): qty 1

## 2020-10-16 MED ORDER — ASPIRIN 81 MG PO CHEW
243.0000 mg | CHEWABLE_TABLET | Freq: Once | ORAL | Status: AC
  Administered 2020-10-16: 243 mg via ORAL
  Filled 2020-10-16: qty 3

## 2020-10-16 MED ORDER — SODIUM CHLORIDE (PF) 0.9 % IJ SOLN
INTRAMUSCULAR | Status: AC
  Filled 2020-10-16: qty 50

## 2020-10-16 MED ORDER — FENTANYL CITRATE (PF) 100 MCG/2ML IJ SOLN
50.0000 ug | Freq: Once | INTRAMUSCULAR | Status: AC
Start: 2020-10-16 — End: 2020-10-16
  Administered 2020-10-16: 50 ug via INTRAVENOUS
  Filled 2020-10-16: qty 2

## 2020-10-16 MED ORDER — ATORVASTATIN CALCIUM 20 MG PO TABS
20.0000 mg | ORAL_TABLET | Freq: Every evening | ORAL | Status: DC
  Administered 2020-10-16 – 2020-10-17 (×2): 20 mg via ORAL
  Filled 2020-10-16 (×2): qty 1

## 2020-10-16 MED ORDER — FLUOXETINE HCL 20 MG PO CAPS: 60.0000 mg | ORAL_CAPSULE | Freq: Every day | ORAL | Status: DC

## 2020-10-16 MED ORDER — ASPIRIN 81 MG PO CHEW
324.0000 mg | CHEWABLE_TABLET | Freq: Once | ORAL | Status: AC
  Administered 2020-10-16: 324 mg via ORAL
  Filled 2020-10-16: qty 4

## 2020-10-16 MED ORDER — ASPIRIN 81 MG PO CHEW
81.0000 mg | CHEWABLE_TABLET | Freq: Every morning | ORAL | Status: DC
  Administered 2020-10-17: 81 mg via ORAL
  Filled 2020-10-16: qty 1

## 2020-10-16 NOTE — ED Notes (Signed)
Meds not given at this time , waiting on pharmacy to check them off

## 2020-10-16 NOTE — ED Provider Notes (Signed)
[name] Provider Note   CSN: [id number] Arrival date & time: 10/16/20  1422     History Chief Complaint  Patient presents with  . Chest Pain    Karen Scott is a 65 y.o. female.  The history is provided by the patient and medical records.  Chest Pain  Karen Scott is a 65 y.o. female who presents to the Emergency Department complaining of chest pain. She presents to the ED complaining of squeezing left sided chest pain that radiates to the left shoulder and down the left arm.  Now pain is in the left back.  Pain started at 2pm.  Pain is constant in nature.  Has associated nausea. Pain started at rest.   No fever, cough.  No diaphoresis.  Feels clammy.  Has DOE.  No abdominal pain, NVD.  Has lower extremity edema, for a while.    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 Not currently anticoagulated.     Past Medical History:  Diagnosis Date  . Abdominal wall hernia   . Adenomatous colon polyp   . Anxiety   . CAD (coronary artery disease)   . Carpal tunnel syndrome   . Diabetes mellitus without complication ([place])   . Diverticulosis   . DVT (deep venous thrombosis) ([place])   . Esophageal stricture   . GERD (gastroesophageal reflux disease)   . Hepatic steatosis   . Hyperlipidemia   . Hypertension   . Internal hemorrhoids   . Ovarian cyst   . Shingles     Patient Active Problem List   Diagnosis Date Noted  . Chest pain 10/16/2020  . 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 HTN (hypertension) 10/12/2013  . Type 2 diabetes mellitus with hyperlipidemia ([place]) 10/12/2013    Past Surgical History:  Procedure Laterality Date  . ANKLE SURGERY     left ankle ligament repair  . BREAST SURGERY     biopsy  . CHOLECYSTECTOMY    . JOINT REPLACEMENT  2008     OB History   No obstetric history on file.     Family History  Problem Relation Age of Onset  . Diabetes Mother   . Hyperlipidemia Mother   . Hypertension Mother   . Stroke  Mother   . Lung cancer Father   . Hearing loss Maternal Grandmother   . Hearing loss Paternal Grandmother   . Hearing loss Paternal Grandfather     Social History   Tobacco Use  . Smoking status: Never Smoker  . Smokeless tobacco: Never Used  Vaping Use  . Vaping Use: Never used  Substance Use Topics  . Alcohol use: No    Alcohol/week: 0.0 standard drinks    Comment: socially  . Drug use: No    Home Medications Prior to Admission medications   Medication Sig Start Date End Date Taking? Authorizing Provider  acetaminophen (TYLENOL) 500 MG tablet Take 1,000 mg by mouth every 6 (six) hours as needed for moderate pain.   Yes [provider]  aspirin 81 MG chewable tablet Chew 81 mg by mouth every morning.   Yes [provider]  atorvastatin (LIPITOR) 20 MG tablet Take 20 mg by mouth every evening.    Yes [provider]  dicyclomine (BENTYL) 20 MG tablet Take 20 mg by mouth daily as needed for spasms. 02/20/20  Yes [provider]  FLUoxetine (PROZAC) 20 MG capsule Take 3 capsules (60 mg total) by mouth daily. Patient taking differently: Take  40 mg by mouth daily. 05/31/20  Yes Lomax, Amy, NP  losartan-hydrochlorothiazide (HYZAAR) 100-25 MG per tablet Take 1 tablet by mouth every morning.    Yes [provider]  metFORMIN (GLUCOPHAGE-XR) 500 MG 24 hr tablet Take 500 mg by mouth 2 (two) times daily.   Yes [provider]  Omega-3 Fatty Acids (FISH OIL PO) Take 1 capsule by mouth daily.   Yes [provider]  Pantoprazole Sodium (PROTONIX PO) Take 1 tablet by mouth 2 (two) times daily.   Yes [provider]  rizatriptan (MAXALT-MLT) 10 MG disintegrating tablet Take 1 tablet (10 mg total) by mouth as needed for migraine. May repeat in 2 hours if needed Patient taking differently: Take 10 mg by mouth daily as needed for migraine. May repeat in 2 hours if needed 05/31/20  Yes Lomax, Amy, NP  therapeutic  multivitamin-minerals (THERAGRAN-M) tablet Take 1 tablet by mouth daily.   Yes [provider]    Allergies    Erythromycin, Penicillins, and Tramadol  Review of Systems   Review of Systems  Cardiovascular: Positive for chest pain.  All other systems reviewed and are negative.   Physical Exam Updated Vital Signs BP 121/87   Pulse 90   Temp 98.3 F (36.8 C) (Oral)   Resp 18   Ht 5\' 3"  (1.6 m)   Wt 72.8 kg   SpO2 97%   BMI 28.41 kg/m   Physical Exam Vitals and nursing note reviewed.  Constitutional:      Appearance: She is well-developed.  HENT:     Head: Normocephalic and atraumatic.  Cardiovascular:     Rate and Rhythm: Normal rate and regular rhythm.     Heart sounds: No murmur heard.   Pulmonary:     Effort: Pulmonary effort is normal. No respiratory distress.     Breath sounds: Normal breath sounds.  Abdominal:     Palpations: Abdomen is soft.     Tenderness: There is no guarding or rebound.     Comments: Moderate epigastric tenderness  Musculoskeletal:        General: No swelling or tenderness.  Skin:    General: Skin is warm and dry.  Neurological:     Mental Status: She is alert and oriented to person, place, and time.  Psychiatric:        Behavior: Behavior normal.     ED Results / Procedures / Treatments   Labs (all labs ordered are listed, but only abnormal results are displayed) Labs Reviewed  BASIC METABOLIC PANEL - Abnormal; Notable for the following components:      Result Value   Sodium 134 (*)    Potassium 3.4 (*)    Glucose, Bld 164 (*)    All other components within normal limits  D-DIMER, QUANTITATIVE - Abnormal; Notable for the following components:   D-Dimer, Quant 0.55 (*)    All other components within normal limits  LIPID PANEL - Abnormal; Notable for the following components:   HDL 24 (*)    All other components within normal limits  SARS CORONAVIRUS 2 (TAT 6-24 HRS)  CBC  HEPATIC FUNCTION PANEL  LIPASE, BLOOD   MAGNESIUM  HIV ANTIBODY (ROUTINE TESTING W REFLEX)  CBC  BASIC METABOLIC PANEL  HEMOGLOBIN A1C  TROPONIN I (HIGH SENSITIVITY)  TROPONIN I (HIGH SENSITIVITY)    EKG EKG Interpretation  Date/Time:  Saturday Oct 16 2020 14:27:39 EDT Ventricular Rate:  77 PR Interval:  198 QRS Duration: 84 QT Interval:  416  QTC Calculation: 470 R Axis:   -39 Text Interpretation: Normal sinus rhythm Left axis deviation no change from previous. Confirmed by Charlesetta Shanks [id number]) on 10/16/2020 2:38:09 PM   Radiology CT ANGIO CHEST PE W OR WO CONTRAST  Result Date: 10/16/2020 CLINICAL DATA:  Chest pain. EXAM: CT ANGIOGRAPHY CHEST WITH CONTRAST TECHNIQUE: Multidetector CT imaging of the chest was performed using the standard protocol during bolus administration of intravenous contrast. Multiplanar CT image reconstructions and MIPs were obtained to evaluate the vascular anatomy. CONTRAST:  149mL OMNIPAQUE IOHEXOL 350 MG/ML SOLN COMPARISON:  None. FINDINGS: Cardiovascular: Satisfactory opacification of the pulmonary arteries to the segmental level. No evidence of pulmonary embolism. Normal heart size. No pericardial effusion. Mediastinum/Nodes: No enlarged mediastinal, hilar, or axillary lymph nodes. Thyroid gland, trachea, and esophagus demonstrate no significant findings. Lungs/Pleura: Lungs are clear. No pleural effusion or pneumothorax. Upper Abdomen: No acute abnormality. Musculoskeletal: No chest wall abnormality. No acute or significant osseous findings. Review of the MIP images confirms the above findings. IMPRESSION: No definite evidence of pulmonary embolus. No definite acute abnormality seen in the chest. Electronically Signed   By: Marijo Conception M.D.   On: 10/16/2020 20:19   DG Chest Port 1 View  Result Date: 10/16/2020 CLINICAL DATA:  Chest pain EXAM: PORTABLE CHEST 1 VIEW COMPARISON:  05/23/2008 FINDINGS: Heart and mediastinal contours are within normal limits. No focal opacities or effusions. No  acute bony abnormality. IMPRESSION: No active disease. Electronically Signed   By: Rolm Baptise M.D.   On: 10/16/2020 14:56    Procedures Procedures   Medications Ordered in ED Medications  nitroGLYCERIN (NITROSTAT) SL tablet 0.4 mg (0.4 mg Sublingual Given 10/16/20 2305)  aspirin chewable tablet 81 mg (has no administration in time range)  atorvastatin (LIPITOR) tablet 20 mg (20 mg Oral Given 10/16/20 2250)  acetaminophen (TYLENOL) tablet 650 mg (has no administration in time range)  ondansetron (ZOFRAN) injection 4 mg (has no administration in time range)  enoxaparin (LOVENOX) injection 40 mg (40 mg Subcutaneous Given 10/16/20 2251)  potassium chloride SA (KLOR-CON) CR tablet 20 mEq (has no administration in time range)  sodium chloride (PF) 0.9 % injection (has no administration in time range)  FLUoxetine (PROZAC) capsule 40 mg (40 mg Oral Given 10/16/20 2251)  metoprolol tartrate (LOPRESSOR) injection 5 mg (has no administration in time range)  aspirin chewable tablet 243 mg (243 mg Oral Given 10/16/20 1452)  aspirin chewable tablet 324 mg (324 mg Oral Given 10/16/20 1538)  fentaNYL (SUBLIMAZE) injection 50 mcg (50 mcg Intravenous Given 10/16/20 1707)  morphine 2 MG/ML injection 2 mg (2 mg Intravenous Given 10/16/20 1851)  iohexol (OMNIPAQUE) 350 MG/ML injection 100 mL (100 mLs Intravenous Contrast Given 10/16/20 1958)    ED Course  I have reviewed the triage vital signs and the nursing notes.  Pertinent labs & imaging results that were available during my care of the patient were reviewed by me and considered in my medical decision making (see chart for details).    MDM Rules/Calculators/A&P                         Patient with history of coronary artery disease here for evaluation of chest pain. Based on her risk factors recommend admission for ongoing observation and workup. Hospitalist consulted for admission.  Final Clinical Impression(s) / ED Diagnoses Final diagnoses:  None     Rx / DC Orders ED Discharge Orders    None  Quintella Reichert, MD 10/16/20 [phone]

## 2020-10-16 NOTE — ED Triage Notes (Addendum)
Patient reports chest pain starting around 45 minutes ago, suddenly. Denies injury. Pain began to radiate down left arm and now feels in between shoulder blades. Patients says this happened years ago when she had a blockage. Pain rated 7/10. Patient has history of htn, frequently misses doses of medication.

## 2020-10-16 NOTE — H&P (Signed)
History and Physical        Hospital Admission Note Date: 10/16/2020  Patient name: Karen Scott Medical record number: [id number] Date of birth: 1956-05-22 Age: 65 y.o. Gender: female  PCP: Cari Caraway, MD    Chief Complaint    Chief Complaint  Patient presents with  . Chest Pain      HPI:   This is a 65 year old female with a past medical history of hypertension, hyperlipidemia, GERD, unprovoked right lower extremity DVT in 2012 currently not on anticoagulation, diabetes, CAD s/p stent 20 years ago (no record on file), CVA, migraine who presented to the ED with sudden onset left-sided chest pain with radiation to left arm and upper back that started roughly 45 minutes prior to arrival.  Patient stated she was sitting down with her legs upright when she had sudden onset chest pain described as pressure/tightness with radiation to her left arm.  Says the chest pain feels similar to when she had her stent placed 20 years ago.  She does have shortness of breath on exertion and mild shortness of breath currently at rest and her chest pain is both at rest on exertion. She has noticed lower extremity swelling recently most notably in her left lower extremity and some left lower extremity discomfort recently.  She took an aspirin at home and contacted EMS who gave her additional aspirin and nitroglycerin.  She continues to have pain currently despite these therapies.  Denies tobacco use or alcohol use.  She has not been lifting anything heavy or doing active manual labor.   ED Course: Afebrile, hypertensive, on room air. Notable Labs: Sodium 134, K3.4, glucose 164, troponin 4.  EKG left axis deviation normal sinus rhythm similar to prior EKGs.  Notable Imaging: CXR-unremarkable. Patient received full dose aspirin, nitroglycerin.    Vitals:   10/16/20 1520 10/16/20 1615  BP: (!)  139/111 128/89  Pulse: 75 72  Resp: 16 11  Temp:    SpO2: 100% 96%     Review of Systems:  Review of Systems  All other systems reviewed and are negative.   Medical/Social/Family History   Past Medical History: Past Medical History:  Diagnosis Date  . Abdominal wall hernia   . Adenomatous colon polyp   . Anxiety   . CAD (coronary artery disease)   . Carpal tunnel syndrome   . Diabetes mellitus without complication ([place])   . Diverticulosis   . DVT (deep venous thrombosis) ([place])   . Esophageal stricture   . GERD (gastroesophageal reflux disease)   . Hepatic steatosis   . Hyperlipidemia   . Hypertension   . Internal hemorrhoids   . Ovarian cyst   . Shingles     Past Surgical History:  Procedure Laterality Date  . ANKLE SURGERY     left ankle ligament repair  . BREAST SURGERY     biopsy  . CHOLECYSTECTOMY    . JOINT REPLACEMENT  2008    Medications: Prior to Admission medications   Medication Sig Start Date End Date Taking? Authorizing Provider  aspirin 81 MG chewable tablet Chew 81 mg by mouth every morning.    [provider]  atorvastatin (LIPITOR) 20 MG tablet Take 20  mg by mouth every evening.     [provider]  FLUoxetine (PROZAC) 20 MG capsule Take 3 capsules (60 mg total) by mouth daily. 05/31/20   Lomax, Amy, NP  losartan-hydrochlorothiazide (HYZAAR) 100-25 MG per tablet Take 1 tablet by mouth every morning.     [provider]  metFORMIN (GLUCOPHAGE-XR) 500 MG 24 hr tablet Take 500 mg by mouth 2 (two) times daily.    [provider]  Omega-3 Fatty Acids (FISH OIL PO) Take by mouth.    [provider]  Pantoprazole Sodium (PROTONIX PO) Take by mouth 2 (two) times daily.    [provider]  rizatriptan (MAXALT-MLT) 10 MG disintegrating tablet Take 1 tablet (10 mg total) by mouth as needed for migraine. May repeat in 2 hours if needed 05/31/20   Lomax, Amy, NP  therapeutic multivitamin-minerals  [facility]) tablet Take 1 tablet by mouth daily.    [provider]    Allergies:   Allergies  Allergen Reactions  . Erythromycin     All in this class  . Penicillins     All "myocins" : unknown Has patient had a PCN reaction causing immediate rash, facial/tongue/throat swelling, SOB or lightheadedness with hypotension: n/a Has patient had a PCN reaction causing severe rash involving mucus membranes or skin necrosis: n/a Has patient had a PCN reaction that required hospitalization: n/a Has patient had a PCN reaction occurring within the last 10 years: n/a If all of the above answers are "NO", then may proceed with Cephalosporin use.   . Tramadol     Social History:  reports that she has never smoked. She has never used smokeless tobacco. She reports that she does not drink alcohol and does not use drugs.  Family History: Family History  Problem Relation Age of Onset  . Diabetes Mother   . Hyperlipidemia Mother   . Hypertension Mother   . Stroke Mother   . Lung cancer Father   . Hearing loss Maternal Grandmother   . Hearing loss Paternal Grandmother   . Hearing loss Paternal Grandfather      Objective   Physical Exam: Blood pressure 128/89, pulse 72, temperature 98.5 F (36.9 C), temperature source Oral, resp. rate 11, height 5\' 3"  (1.6 m), SpO2 96 %.  Physical Exam Vitals and nursing note reviewed.  Constitutional:      Appearance: Normal appearance.  HENT:     Head: Normocephalic and atraumatic.  Eyes:     Conjunctiva/sclera: Conjunctivae normal.  Cardiovascular:     Rate and Rhythm: Normal rate and regular rhythm.     Heart sounds: Normal heart sounds.  Pulmonary:     Effort: Pulmonary effort is normal.     Breath sounds: Normal breath sounds.     Comments: Chest discomfort somewhat reproducible with inspiration Abdominal:     General: Abdomen is flat.     Palpations: Abdomen is soft.  Musculoskeletal:        General: Tenderness present.      Comments: Chest discomfort somewhat reproducible with palpation to left and right anterior chest  Left lower extremity calf tenderness to palpation  Skin:    Coloration: Skin is not jaundiced or pale.  Neurological:     Mental Status: She is alert. Mental status is at baseline.  Psychiatric:        Mood and Affect: Mood normal.        Behavior: Behavior normal.     LABS on Admission: I have personally reviewed all  the labs and imaging below    Basic Metabolic Panel: Recent Labs  Lab 10/16/20 1433  NA 134*  K 3.4*  CL 99  CO2 27  GLUCOSE 164*  BUN 16  CREATININE 0.94  CALCIUM 9.2   Liver Function Tests: Recent Labs  Lab 10/16/20 1523  AST 20  ALT 15  ALKPHOS 75  BILITOT 0.3  PROT 6.9  ALBUMIN 3.7   Recent Labs  Lab 10/16/20 1523  LIPASE 34   No results for input(s): AMMONIA in the last 168 hours. CBC: Recent Labs  Lab 10/16/20 1433  WBC 7.2  HGB 13.0  HCT 38.2  MCV 84.1  PLT 252   Cardiac Enzymes: No results for input(s): CKTOTAL, CKMB, CKMBINDEX, TROPONINI in the last 168 hours. BNP: Invalid input(s): POCBNP CBG: No results for input(s): GLUCAP in the last 168 hours.  Radiological Exams on Admission:  DG Chest Port 1 View  Result Date: 10/16/2020 CLINICAL DATA:  Chest pain EXAM: PORTABLE CHEST 1 VIEW COMPARISON:  05/23/2008 FINDINGS: Heart and mediastinal contours are within normal limits. No focal opacities or effusions. No acute bony abnormality. IMPRESSION: No active disease. Electronically Signed   By: Rolm Baptise M.D.   On: 10/16/2020 14:56      EKG: unchanged from previous tracings, normal sinus rhythm, left axis deviation   A & P   Principal Problem:   Chest pain Active Problems:   HTN (hypertension)   Type 2 diabetes mellitus with hyperlipidemia ([place])   1. Atypical left-sided chest pain, most concerning for PE vs musculoskeletal vs less likely cardiac etiology or GERD  a. Chest pain is left-sided, occurs at rest and no  improvement with nitroglycerin though she does have risk factors with history of CAD, DM, CVA, hypertension and hyperlipidemia b. Troponin negative x1, will repeat now c. Has a history of unprovoked RLE DVT and is relatively sedentary d. Since she is hemodynamically stable on room air and in the setting of contrast shortage will order a D-dimer -> CTA chest if positive e. Check lipid panel and HbA1c to risk stratify f. Bilateral lower extremity US g. Echo h. Telemetry  2. Hypertension a. Will change losartan-HCTZ to amlodipine starting tomorrow to avoid AKI while inpatient in case she needs a contrast study b. As needed Lopressor  3. Hyperlipidemia a. Check lipid panel b. Continue statin  4. Type 2 diabetes a. Carb modified diet for now  5. History of migraine a. Holding triptan however this is unlikely contributing to her chest pain    DVT prophylaxis: Lovenox   Code Status: Full Code  Diet: Heart healthy carb modified Family Communication: Admission, patients condition and plan of care including tests being ordered have been discussed with the patient who indicates understanding and agrees with the plan and Code Status. Patient's son was updated  Disposition Plan: The appropriate patient status for this patient is OBSERVATION. Observation status is judged to be reasonable and necessary in order to provide the required intensity of service to ensure the patient's safety. The patient's presenting symptoms, physical exam findings, and initial radiographic and laboratory data in the context of their medical condition is felt to place them at decreased risk for further clinical deterioration. Furthermore, it is anticipated that the patient will be medically stable for discharge from the hospital within 2 midnights of admission. The following factors support the patient status of observation.   " The patient's presenting symptoms include left chest pain. " The physical exam findings  include reproducible  left chest pain and left calf tenderness to palpation. " The initial radiographic and laboratory data are unremarkable.    Status is: Observation  The patient remains OBS appropriate and will d/c before 2 midnights.  Dispo: The patient is from: Home              Anticipated d/c is to: Home              Patient currently is not medically stable to d/c.   Difficult to place patient No      Consultants  . None  Procedures  . None  Time Spent on Admission: 67 minutes    Harold Hedge, DO [facility]  10/16/2020, 5:32 PM

## 2020-10-16 NOTE — ED Provider Notes (Signed)
Emergency Medicine Provider Triage Evaluation Note  Karen Scott , a 65 y.o. female  was evaluated in triage.  Pt complains of left-sided chest pain onset 45 minutes prior to arrival in the emergency room while sitting in a recliner resting.  Described as a sharp tingling pain on the left side of her chest which radiates down her left arm to her fingers and into her left upper back.  Associated with shortness of breath, worse with exertion.  Denies nausea or diaphoresis.  States feels similar to when she had a blockage in her heart several years ago, treated at [facility], no longer followed by cardiology, reports intermittent compliance with her blood pressure medication, did take an 81 mg aspirin this morning..  Review of Systems  Positive: CP, SHOB Negative: Nausea, diaphoresis  Physical Exam  BP (!) 167/103 (BP Location: Right Arm)   Pulse 82   Temp 98.5 F (36.9 C) (Oral)   Resp 16   Ht 5\' 3"  (1.6 m)   SpO2 100%   BMI 28.70 kg/m  Gen:   Awake, no distress   Resp:  Normal effort  MSK:   Moves extremities without difficulty  Other:  No chest wall tenderness, skin dry, lungs CTA  Medical Decision Making  Medically screening exam initiated at 2:37 PM.  Appropriate orders placed.  Rodrigo Ran was informed that the remainder of the evaluation will be completed by another provider, this initial triage assessment does not replace that evaluation, and the importance of remaining in the ED until their evaluation is complete.     Tacy Learn, PA-C 10/16/20 1438    Charlesetta Shanks, MD 11/01/20 [phone]

## 2020-10-17 ENCOUNTER — Observation Stay (HOSPITAL_BASED_OUTPATIENT_CLINIC_OR_DEPARTMENT_OTHER): Payer: Medicare Other

## 2020-10-17 ENCOUNTER — Other Ambulatory Visit: Payer: Self-pay

## 2020-10-17 ENCOUNTER — Encounter (HOSPITAL_COMMUNITY): Payer: Self-pay | Admitting: Internal Medicine

## 2020-10-17 DIAGNOSIS — R079 Chest pain, unspecified: Secondary | ICD-10-CM | POA: Diagnosis not present

## 2020-10-17 DIAGNOSIS — M79606 Pain in leg, unspecified: Secondary | ICD-10-CM | POA: Diagnosis not present

## 2020-10-17 DIAGNOSIS — R0789 Other chest pain: Secondary | ICD-10-CM | POA: Diagnosis not present

## 2020-10-17 DIAGNOSIS — Z20822 Contact with and (suspected) exposure to covid-19: Secondary | ICD-10-CM | POA: Diagnosis not present

## 2020-10-17 DIAGNOSIS — M7989 Other specified soft tissue disorders: Secondary | ICD-10-CM | POA: Diagnosis not present

## 2020-10-17 DIAGNOSIS — I2510 Atherosclerotic heart disease of native coronary artery without angina pectoris: Secondary | ICD-10-CM | POA: Diagnosis not present

## 2020-10-17 DIAGNOSIS — R0782 Intercostal pain: Secondary | ICD-10-CM

## 2020-10-17 DIAGNOSIS — E119 Type 2 diabetes mellitus without complications: Secondary | ICD-10-CM | POA: Diagnosis not present

## 2020-10-17 LAB — ECHOCARDIOGRAM COMPLETE
Area-P 1/2: 3.42 cm2
Height: 63 in
S' Lateral: 2.4 cm
Weight: 2566.4 oz

## 2020-10-17 LAB — HEMOGLOBIN A1C
Hgb A1c MFr Bld: 8.5 % — ABNORMAL HIGH (ref 4.8–5.6)
Mean Plasma Glucose: 197.25 mg/dL

## 2020-10-17 LAB — BASIC METABOLIC PANEL
Anion gap: 5 (ref 5–15)
BUN: 17 mg/dL (ref 8–23)
CO2: 31 mmol/L (ref 22–32)
Calcium: 8.9 mg/dL (ref 8.9–10.3)
Chloride: 101 mmol/L (ref 98–111)
Creatinine, Ser: 0.94 mg/dL (ref 0.44–1.00)
GFR, Estimated: >60 mL/min (ref >60)
Glucose, Bld: 151 mg/dL — ABNORMAL HIGH (ref 70–99)
Potassium: 3.3 mmol/L — ABNORMAL LOW (ref 3.5–5.1)
Sodium: 137 mmol/L (ref 135–145)

## 2020-10-17 LAB — CBC
HCT: 35.4 % — ABNORMAL LOW (ref 36.0–46.0)
Hemoglobin: 11.7 g/dL — ABNORMAL LOW (ref 12.0–15.0)
MCH: 28.1 pg (ref 26.0–34.0)
MCHC: 33.1 g/dL (ref 30.0–36.0)
MCV: 84.9 fL (ref 80.0–100.0)
Platelets: 200 10*3/uL (ref 150–400)
RBC: 4.17 MIL/uL (ref 3.87–5.11)
RDW: 13.4 % (ref 11.5–15.5)
WBC: 6.1 10*3/uL (ref 4.0–10.5)
nRBC: 0 % (ref 0.0–0.2)

## 2020-10-17 LAB — HIV ANTIBODY (ROUTINE TESTING W REFLEX): HIV Screen 4th Generation wRfx: NONREACTIVE

## 2020-10-17 LAB — SARS CORONAVIRUS 2 (TAT 6-24 HRS): SARS Coronavirus 2: NEGATIVE

## 2020-10-17 MED ORDER — POTASSIUM CHLORIDE CRYS ER 20 MEQ PO TBCR
40.0000 meq | EXTENDED_RELEASE_TABLET | Freq: Once | ORAL | Status: AC
  Administered 2020-10-17: 40 meq via ORAL
  Filled 2020-10-17: qty 2

## 2020-10-17 MED ORDER — IBUPROFEN 200 MG PO TABS: 400.0000 mg | ORAL_TABLET | Freq: Four times a day (QID) | ORAL | Status: DC | PRN

## 2020-10-17 MED ORDER — KETOROLAC TROMETHAMINE 15 MG/ML IJ SOLN
15.0000 mg | Freq: Once | INTRAMUSCULAR | Status: AC
  Administered 2020-10-17: 15 mg via INTRAVENOUS
  Filled 2020-10-17: qty 1

## 2020-10-17 MED ORDER — DICLOFENAC SODIUM 1 % EX GEL: 4.0000 g | Freq: Four times a day (QID) | CUTANEOUS | Status: DC

## 2020-10-17 NOTE — Progress Notes (Signed)
Pt was complaining of chest pain. Gave PRN Nitroglycerin and followed up with 12-lead EKG. 12-lead EKG are in patient's shadow chart. Patient did get some relief from the PRN Nitroglycerin.

## 2020-10-17 NOTE — Progress Notes (Signed)
Pt given and explained discharge instructions, as well as the medications recommended by the MD to take OTC at home for pain relief. No questions from the pt or son. Tele removed. IV removed. Pt dressed in personal clothing and taken to the main entrance via wheelchair.

## 2020-10-17 NOTE — Discharge Summary (Signed)
Physician Discharge Summary  Karen Scott [id number] DOB: 1955/08/24 DOA: 10/16/2020  PCP: Cari Caraway, MD  Admit date: 10/16/2020 Discharge date: 10/17/2020  Admitted From: home Disposition:  home  Recommendations for Outpatient Follow-up:  1. Follow up with PCP in 1-2 weeks 2. Please obtain BMP/CBC in one week 3. Please follow up with cardiology, Dr. Audie Box  Home Health: No  Equipment/Devices: None   Discharge Condition: Stable  CODE STATUS: Full  Diet recommendation: Heart Healthy      Discharge Diagnoses: Principal Problem:   Chest pain Active Problems:   HTN (hypertension)   Type 2 diabetes mellitus with hyperlipidemia (HCC)    Summary of HPI and Hospital Course:  HPI per Dr. Neysa Bonito: "This is a 65 year old female with a past medical history of hypertension, hyperlipidemia, GERD, unprovoked right lower extremity DVT in 2012 currently not on anticoagulation, diabetes, CAD s/p stent 20 years ago (no record on file), CVA, migraine who presented to the ED with sudden onset left-sided chest pain with radiation to left arm and upper back that started roughly 45 minutes prior to arrival.  Patient stated she was sitting down with her legs upright when she had sudden onset chest pain described as pressure/tightness with radiation to her left arm.  Says the chest pain feels similar to when she had her stent placed 20 years ago.  She does have shortness of breath on exertion and mild shortness of breath currently at rest and her chest pain is both at rest on exertion. She has noticed lower extremity swelling recently most notably in her left lower extremity and some left lower extremity discomfort recently.  She took an aspirin at home and contacted EMS who gave her additional aspirin and nitroglycerin.  She continues to have pain currently despite these therapies.  Denies tobacco use or alcohol use.  She has not been lifting anything heavy or doing active manual labor.   ED  Course: Afebrile, hypertensive, on room air. Notable Labs: Sodium 134, K3.4, glucose 164, troponin 4.  EKG left axis deviation normal sinus rhythm similar to prior EKGs.  Notable Imaging: CXR-unremarkable. Patient received full dose aspirin, nitroglycerin. "   1. Atypical left-sided chest pain of musculoskeletal etiology.  Unlikely cardiac or GI -  Chest pain is left-sided, occurs at rest and no improvement with nitroglycerin though she does have risk factors with history of CAD, DM, CVA, hypertension and hyperlipidemia. Chest pain was noted to be reproducible on palpation and aggravated by movements. --Troponin negative x2 --History of unprovoked RLE DVT and is relatively sedentary.    D dimer was elevated, so CTA chest obtained which was negative for PE or other acute abnormalities. --Bilateral lower extremity US negative for DVT -- Echo normal - EF [id number] normal diastolic function Discussed with cardiology, Dr. Audie Box who reviewed the CTA chest.  Recommended outpatient follow up for possible stress test vs coronary CT   2. Hypertension - substituted losartan-HCTZ with amlodipine during admission to avoid AKI while inpatient in case need for a contrast study.  PRN Lopressor.  3. Hyperlipidemia - Lipid panel: Total chol 93, LDL 50, HDL low 24.  Continue statin.  4. Type 2 diabetes - carb modified diet.    5. History of migraine - Held triptan, although unlikely contributing to her chest pain.  Resume on d/c.    Discharge Instructions   Discharge Instructions    Call MD for:  extreme fatigue   Complete by: As directed    Call MD for:  persistant dizziness or light-headedness   Complete by: As directed    Call MD for:  persistant nausea and vomiting   Complete by: As directed    Call MD for:  severe uncontrolled pain   Complete by: As directed    Call MD for:  temperature >100.4   Complete by: As directed    Diet - low sodium heart healthy   Complete by: As directed     Discharge instructions   Complete by: As directed    Dr. Kathalene Frames office will contact you to set up a cardiology visit.  For pain, I'd recommend ibuprofen (or Aleve) for the next 3-5 days.   If pain persists longer than that, osteopathic manipulation may benefit you.  I suspect you have a rib out of place.  This is fairly common and they usually "work themselves" back into place with a little bit of time.   I included Dr. Nicolasa Ducking contact information in this packet.   Increase activity slowly   Complete by: As directed      Allergies as of 10/17/2020      Reactions   Erythromycin    All in this class   Penicillins    All "myocins" : unknown Has patient had a PCN reaction causing immediate rash, facial/tongue/throat swelling, SOB or lightheadedness with hypotension: n/a Has patient had a PCN reaction causing severe rash involving mucus membranes or skin necrosis: n/a Has patient had a PCN reaction that required hospitalization: n/a Has patient had a PCN reaction occurring within the last 10 years: n/a If all of the above answers are "NO", then may proceed with Cephalosporin use.   Tramadol       Medication List    TAKE these medications   acetaminophen 500 MG tablet Commonly known as: TYLENOL Take 1,000 mg by mouth every 6 (six) hours as needed for moderate pain.   aspirin 81 MG chewable tablet Chew 81 mg by mouth every morning.   atorvastatin 20 MG tablet Commonly known as: LIPITOR Take 20 mg by mouth every evening.   diclofenac Sodium 1 % Gel Commonly known as: Voltaren Apply 4 g topically 4 (four) times daily. To areas of chest and back pain.   dicyclomine 20 MG tablet Commonly known as: BENTYL Take 20 mg by mouth daily as needed for spasms.   FISH OIL PO Take 1 capsule by mouth daily.   FLUoxetine 20 MG capsule Commonly known as: PROZAC Take 3 capsules (60 mg total) by mouth daily. What changed: how much to take   ibuprofen 200 MG tablet Commonly known as:  ADVIL Take 2-3 tablets (400-600 mg total) by mouth every 6 (six) hours as needed for mild pain or moderate pain. Take with food.   losartan-hydrochlorothiazide 100-25 MG tablet Commonly known as: HYZAAR Take 1 tablet by mouth every morning.   metFORMIN 500 MG 24 hr tablet Commonly known as: GLUCOPHAGE-XR Take 500 mg by mouth 2 (two) times daily.   PROTONIX PO Take 1 tablet by mouth 2 (two) times daily.   rizatriptan 10 MG disintegrating tablet Commonly known as: MAXALT-MLT Take 1 tablet (10 mg total) by mouth as needed for migraine. May repeat in 2 hours if needed What changed: when to take this   therapeutic multivitamin-minerals tablet Take 1 tablet by mouth daily.       Follow-up Information    Gerda Diss, DO. Call.   Specialty: Sports Medicine Why: Call to request appointment for osteopathic manipulation if your pain  continues longer than a few days.  Practice name is [facility] Contact information: [address] [place] [address] [phone]              Allergies  Allergen Reactions  . Erythromycin     All in this class  . Penicillins     All "myocins" : unknown Has patient had a PCN reaction causing immediate rash, facial/tongue/throat swelling, SOB or lightheadedness with hypotension: n/a Has patient had a PCN reaction causing severe rash involving mucus membranes or skin necrosis: n/a Has patient had a PCN reaction that required hospitalization: n/a Has patient had a PCN reaction occurring within the last 10 years: n/a If all of the above answers are "NO", then may proceed with Cephalosporin use.   . Tramadol      If you experience worsening of your admission symptoms, develop shortness of breath, life threatening emergency, suicidal or homicidal thoughts you must seek medical attention immediately by calling 911 or calling your MD immediately  if symptoms less severe.    Please note   You were cared for by a  hospitalist during your hospital stay. If you have any questions about your discharge medications or the care you received while you were in the hospital after you are discharged, you can call the unit and asked to speak with the hospitalist on call if the hospitalist that took care of you is not available. Once you are discharged, your primary care physician will handle any further medical issues. Please note that NO REFILLS for any discharge medications will be authorized once you are discharged, as it is imperative that you return to your primary care physician (or establish a relationship with a primary care physician if you do not have one) for your aftercare needs so that they can reassess your need for medications and monitor your lab values.   Consultations:  none    Procedures/Studies: CT ANGIO CHEST PE W OR WO CONTRAST  Result Date: 10/16/2020 CLINICAL DATA:  Chest pain. EXAM: CT ANGIOGRAPHY CHEST WITH CONTRAST TECHNIQUE: Multidetector CT imaging of the chest was performed using the standard protocol during bolus administration of intravenous contrast. Multiplanar CT image reconstructions and MIPs were obtained to evaluate the vascular anatomy. CONTRAST:  187mL OMNIPAQUE IOHEXOL 350 MG/ML SOLN COMPARISON:  None. FINDINGS: Cardiovascular: Satisfactory opacification of the pulmonary arteries to the segmental level. No evidence of pulmonary embolism. Normal heart size. No pericardial effusion. Mediastinum/Nodes: No enlarged mediastinal, hilar, or axillary lymph nodes. Thyroid gland, trachea, and esophagus demonstrate no significant findings. Lungs/Pleura: Lungs are clear. No pleural effusion or pneumothorax. Upper Abdomen: No acute abnormality. Musculoskeletal: No chest wall abnormality. No acute or significant osseous findings. Review of the MIP images confirms the above findings. IMPRESSION: No definite evidence of pulmonary embolus. No definite acute abnormality seen in the chest. Electronically  Signed   By: Marijo Conception M.D.   On: 10/16/2020 20:19   DG Chest Port 1 View  Result Date: 10/16/2020 CLINICAL DATA:  Chest pain EXAM: PORTABLE CHEST 1 VIEW COMPARISON:  05/23/2008 FINDINGS: Heart and mediastinal contours are within normal limits. No focal opacities or effusions. No acute bony abnormality. IMPRESSION: No active disease. Electronically Signed   By: Rolm Baptise M.D.   On: 10/16/2020 14:56   ECHOCARDIOGRAM COMPLETE  Result Date: 10/17/2020    ECHOCARDIOGRAM REPORT   Patient Name:   Karen Scott Date of Exam: 10/17/2020 Medical Rec #:  [id number]  Height:       63.0 in Accession #:    [id number]    Weight:       160.4 lb Date of Birth:  [birth year]    BSA:          1.761 m Patient Age:    64 years      BP:           133/72 mmHg Patient Gender: F             HR:           71 bpm. Exam Location:  Inpatient Procedure: 2D Echo, Color Doppler and Cardiac Doppler Indications:    R07.9* Chest pain, unspecified  History:        Patient has no prior history of Echocardiogram examinations.                 CAD; Risk Factors:Hypertension, Diabetes and Dyslipidemia.  Sonographer:    Eulah Pont RDCS Referring Phys: [id number] JARED E SEGAL IMPRESSIONS  1. Left ventricular ejection fraction, by estimation, is 60 to 65%. The left ventricle has normal function. The left ventricle has no regional wall motion abnormalities. Left ventricular diastolic parameters were normal.  2. Right ventricular systolic function is normal. The right ventricular size is normal. There is normal pulmonary artery systolic pressure.  3. The mitral valve is grossly normal. trivial to mild mitral valve regurgitation.  4. The aortic valve is tricuspid. Aortic valve regurgitation is not visualized.  5. The inferior vena cava is normal in size with greater than 50% respiratory variability, suggesting right atrial pressure of 3 mmHg. Comparison(s): No prior Echocardiogram. Conclusion(s)/Recommendation(s): Otherwise normal  echocardiogram, with minor abnormalities described in the report. FINDINGS  Left Ventricle: Left ventricular ejection fraction, by estimation, is 60 to 65%. The left ventricle has normal function. The left ventricle has no regional wall motion abnormalities. The left ventricular internal cavity size was normal in size. There is  no left ventricular hypertrophy. Left ventricular diastolic parameters were normal. Right Ventricle: The right ventricular size is normal. No increase in right ventricular wall thickness. Right ventricular systolic function is normal. There is normal pulmonary artery systolic pressure. The tricuspid regurgitant velocity is 1.68 m/s, and  with an assumed right atrial pressure of 3 mmHg, the estimated right ventricular systolic pressure is 14.3 mmHg. Left Atrium: Left atrial size was normal in size. Right Atrium: Right atrial size was normal in size. Pericardium: There is no evidence of pericardial effusion. Mitral Valve: The mitral valve is grossly normal. Trivial to mild mitral valve regurgitation. Tricuspid Valve: The tricuspid valve is grossly normal. Tricuspid valve regurgitation is trivial. Aortic Valve: The aortic valve is tricuspid. Aortic valve regurgitation is not visualized. Pulmonic Valve: The pulmonic valve was normal in structure. Pulmonic valve regurgitation is not visualized. Aorta: The aortic root and ascending aorta are structurally normal, with no evidence of dilitation. Venous: The inferior vena cava is normal in size with greater than 50% respiratory variability, suggesting right atrial pressure of 3 mmHg. IAS/Shunts: No atrial level shunt detected by color flow Doppler.  LEFT VENTRICLE PLAX 2D LVIDd:         4.00 cm  Diastology LVIDs:         2.40 cm  LV e' medial:    6.25 cm/s LV PW:         0.90 cm  LV E/e' medial:  13.3 LV IVS:        0.80 cm  LV e' lateral:  8.46 cm/s LVOT diam:     2.00 cm  LV E/e' lateral: 9.8 LV SV:         66 LV SV Index:   37 LVOT Area:     3.14  cm  RIGHT VENTRICLE RV S prime:     10.50 cm/s TAPSE (M-mode): 1.6 cm LEFT ATRIUM             Index       RIGHT ATRIUM          Index LA diam:        3.40 cm 1.93 cm/m  RA Area:     8.51 cm LA Vol (A2C):   54.3 ml 30.84 ml/m RA Volume:   12.70 ml 7.21 ml/m LA Vol (A4C):   37.5 ml 21.30 ml/m LA Biplane Vol: 50.1 ml 28.46 ml/m  AORTIC VALVE LVOT Vmax:   99.40 cm/s LVOT Vmean:  66.000 cm/s LVOT VTI:    0.209 m  AORTA Ao Root diam: 3.40 cm Ao Asc diam:  3.50 cm MITRAL VALVE               TRICUSPID VALVE MV Area (PHT): 3.42 cm    TR Peak grad:   11.3 mmHg MV Decel Time: 222 msec    TR Vmax:        168.00 cm/s MV E velocity: 82.90 cm/s MV A velocity: 57.60 cm/s  SHUNTS MV E/A ratio:  1.44        Systemic VTI:  0.21 m                            Systemic Diam: 2.00 cm Lyman Bishop MD Electronically signed by Lyman Bishop MD Signature Date/Time: 10/17/2020/12:17:44 PM    Final    VAS [place] LOWER EXTREMITY VENOUS (DVT)  Result Date: 10/17/2020  Lower Venous DVT Study Patient Name:  ARTASIA THANG  Date of Exam:   10/17/2020 Medical Rec #: [id number]      Accession #:    [id number] Date of Birth: 05/01/1956     Patient Gender: F Patient Age:   64Y Exam Location:  [facility] Procedure:      VAS [place] LOWER EXTREMITY VENOUS (DVT) Referring Phys: [id number] JARED E SEGAL --------------------------------------------------------------------------------  Indications: Pain, and Swelling.  Risk Factors: History of unprovoked DVT 20 years ago. Comparison Study: Prior negative bilateral LEV done 11/25/13 Performing Technologist: Sharion Dove RVS  Examination Guidelines: A complete evaluation includes B-mode imaging, spectral Doppler, color Doppler, and power Doppler as needed of all accessible portions of each vessel. Bilateral testing is considered an integral part of a complete examination. Limited examinations for reoccurring indications may be performed as noted. The reflux portion of the exam is performed with the  patient in reverse Trendelenburg.  +---------+---------------+---------+-----------+----------+--------------+ RIGHT    CompressibilityPhasicitySpontaneityPropertiesThrombus Aging +---------+---------------+---------+-----------+----------+--------------+ CFV      Full           Yes      Yes                                 +---------+---------------+---------+-----------+----------+--------------+ SFJ      Full                                                        +---------+---------------+---------+-----------+----------+--------------+  FV Prox  Full                                                        +---------+---------------+---------+-----------+----------+--------------+ FV Mid   Full                                                        +---------+---------------+---------+-----------+----------+--------------+ FV DistalFull                                                        +---------+---------------+---------+-----------+----------+--------------+ PFV      Full                                                        +---------+---------------+---------+-----------+----------+--------------+ POP      Full           Yes      Yes                                 +---------+---------------+---------+-----------+----------+--------------+ PTV      Full                                                        +---------+---------------+---------+-----------+----------+--------------+ PERO     Full                                                        +---------+---------------+---------+-----------+----------+--------------+   +---------+---------------+---------+-----------+----------+--------------+ LEFT     CompressibilityPhasicitySpontaneityPropertiesThrombus Aging +---------+---------------+---------+-----------+----------+--------------+ CFV      Full           Yes      Yes                                  +---------+---------------+---------+-----------+----------+--------------+ SFJ      Full                                                        +---------+---------------+---------+-----------+----------+--------------+ FV Prox  Full                                                        +---------+---------------+---------+-----------+----------+--------------+  FV Mid   Full                                                        +---------+---------------+---------+-----------+----------+--------------+ FV DistalFull                                                        +---------+---------------+---------+-----------+----------+--------------+ PFV      Full                                                        +---------+---------------+---------+-----------+----------+--------------+ POP      Full           Yes      Yes                                 +---------+---------------+---------+-----------+----------+--------------+ PTV      Full                                                        +---------+---------------+---------+-----------+----------+--------------+ PERO     Full                                                        +---------+---------------+---------+-----------+----------+--------------+     Summary: BILATERAL: - No evidence of deep vein thrombosis seen in the lower extremities, bilaterally. 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  *See table(s) above for measurements and observations. Electronically signed by Deitra Mayo MD on 10/17/2020 at 4:46:47 PM.    Final          Subjective: Pt reports feeling well this AM.  Chest pain occurs at times but seems related to movement, and can reproduce the pain on palpation.  We discussed likely musculoskeletal pain.  Given her risk factors, set up for close cardiology follow up for further evaluation.  Pt and her son in agreement with plan.   Discharge Exam: Vitals:    10/17/20 0619 10/17/20 1358  BP: 133/72 (!) 141/93  Pulse: 70 74  Resp: 18 18  Temp: 97.9 F (36.6 C) 98.3 F (36.8 C)  SpO2: 98% 96%   Vitals:   10/16/20 2310 10/17/20 0619 10/17/20 0619 10/17/20 1358  BP: 121/87 133/72 133/72 (!) 141/93  Pulse: 90 70 70 74  Resp:  16 18 18   Temp:  97.9 F (36.6 C) 97.9 F (36.6 C) 98.3 F (36.8 C)  TempSrc:  Oral Oral Oral  SpO2:  98% 98% 96%  Weight:      Height:        General: Pt is alert, awake, not in  acute distress Cardiovascular: RRR, S1/S2 +, no rubs, no gallops Respiratory: CTA bilaterally, no wheezing, no rhonchi Abdominal: Soft, NT, ND, bowel sounds + Extremities: no edema, no cyanosis MSK: tenderness of palpation of left sternal border and anterior upper chestwall     The results of significant diagnostics from this hospitalization (including imaging, microbiology, ancillary and laboratory) are listed below for reference.     Microbiology: Recent Results (from the past 240 hour(s))  SARS CORONAVIRUS 2 (TAT 6-24 HRS) Nasopharyngeal Nasopharyngeal Swab     Status: None   Collection Time: 10/16/20  4:39 PM   Specimen: Nasopharyngeal Swab  Result Value Ref Range Status   SARS Coronavirus 2 NEGATIVE NEGATIVE Final    Comment: (NOTE) SARS-CoV-2 target nucleic acids are NOT DETECTED.  The SARS-CoV-2 RNA is generally detectable in upper and lower respiratory specimens during the acute phase of infection. Negative results do not preclude SARS-CoV-2 infection, do not rule out co-infections with other pathogens, and should not be used as the sole basis for treatment or other patient management decisions. Negative results must be combined with clinical observations, patient history, and epidemiological information. The expected result is Negative.  Fact Sheet for Patients: SugarRoll.be  Fact Sheet for Healthcare Providers: https://www.woods-mathews.com/  This test is not yet  approved or cleared by the [place] FDA and  has been authorized for detection and/or diagnosis of SARS-CoV-2 by FDA under an Emergency Use Authorization (EUA). This EUA will remain  in effect (meaning this test can be used) for the duration of the COVID-19 declaration under Se ction 564(b)(1) of the Act, 21 U.S.C. section 360bbb-3(b)(1), unless the authorization is terminated or revoked sooner.  Performed at [facility], [place] [address], [place], [place] [address]      Labs: BNP (last 3 results) No results for input(s): BNP in the last 8760 hours. Basic Metabolic Panel: Recent Labs  Lab 10/16/20 1433 10/16/20 1750 10/17/20 0527  NA 134*  --  137  K 3.4*  --  3.3*  CL 99  --  101  CO2 27  --  31  GLUCOSE 164*  --  151*  BUN 16  --  17  CREATININE 0.94  --  0.94  CALCIUM 9.2  --  8.9  MG  --  1.7  --    Liver Function Tests: Recent Labs  Lab 10/16/20 1523  AST 20  ALT 15  ALKPHOS 75  BILITOT 0.3  PROT 6.9  ALBUMIN 3.7   Recent Labs  Lab 10/16/20 1523  LIPASE 34   No results for input(s): AMMONIA in the last 168 hours. CBC: Recent Labs  Lab 10/16/20 1433 10/17/20 0527  WBC 7.2 6.1  HGB 13.0 11.7*  HCT 38.2 35.4*  MCV 84.1 84.9  PLT 252 200   Cardiac Enzymes: No results for input(s): CKTOTAL, CKMB, CKMBINDEX, TROPONINI in the last 168 hours. BNP: Invalid input(s): POCBNP CBG: No results for input(s): GLUCAP in the last 168 hours. D-Dimer Recent Labs    10/16/20 1716  DDIMER 0.55*   Hgb A1c Recent Labs    10/16/20 1736  HGBA1C 8.5*   Lipid Profile Recent Labs    10/16/20 1725  CHOL 93  HDL 24*  LDLCALC 50  TRIG 96  CHOLHDL 3.9   Thyroid function studies No results for input(s): TSH, T4TOTAL, T3FREE, THYROIDAB in the last 72 hours.  Invalid input(s): FREET3 Anemia work up No results for input(s): VITAMINB12, FOLATE, FERRITIN, TIBC, IRON, RETICCTPCT in the last 71  hours. Urinalysis    Component Value Date/Time    COLORURINE YELLOW 07/12/2015 1945   APPEARANCEUR CLOUDY (A) 07/12/2015 1945   LABSPEC 1.030 07/12/2015 1945   PHURINE 5.5 07/12/2015 1945   GLUCOSEU NEGATIVE 07/12/2015 1945   HGBUR SMALL (A) 07/12/2015 1945   BILIRUBINUR NEGATIVE 07/12/2015 1945   KETONESUR NEGATIVE 07/12/2015 1945   PROTEINUR NEGATIVE 07/12/2015 1945   UROBILINOGEN 0.2 11/01/2012 1616   NITRITE NEGATIVE 07/12/2015 1945   LEUKOCYTESUR TRACE (A) 07/12/2015 1945   Sepsis Labs Invalid input(s): PROCALCITONIN,  WBC,  LACTICIDVEN Microbiology Recent Results (from the past 240 hour(s))  SARS CORONAVIRUS 2 (TAT 6-24 HRS) Nasopharyngeal Nasopharyngeal Swab     Status: None   Collection Time: 10/16/20  4:39 PM   Specimen: Nasopharyngeal Swab  Result Value Ref Range Status   SARS Coronavirus 2 NEGATIVE NEGATIVE Final    Comment: (NOTE) SARS-CoV-2 target nucleic acids are NOT DETECTED.  The SARS-CoV-2 RNA is generally detectable in upper and lower respiratory specimens during the acute phase of infection. Negative results do not preclude SARS-CoV-2 infection, do not rule out co-infections with other pathogens, and should not be used as the sole basis for treatment or other patient management decisions. Negative results must be combined with clinical observations, patient history, and epidemiological information. The expected result is Negative.  Fact Sheet for Patients: SugarRoll.be  Fact Sheet for Healthcare Providers: https://www.woods-mathews.com/  This test is not yet approved or cleared by the [place] FDA and  has been authorized for detection and/or diagnosis of SARS-CoV-2 by FDA under an Emergency Use Authorization (EUA). This EUA will remain  in effect (meaning this test can be used) for the duration of the COVID-19 declaration under Se ction 564(b)(1) of the Act, 21 U.S.C. section 360bbb-3(b)(1), unless the authorization is terminated or revoked  sooner.  Performed at [facility], [place] [address], [place], [place] [address]      Time coordinating discharge: Over 30 minutes  SIGNED:   Ezekiel Slocumb, DO [facility] 10/17/2020, 5:28 PM   If 7PM-7AM, please contact night-coverage [email or web address]

## 2020-10-17 NOTE — Progress Notes (Signed)
  Echocardiogram 2D Echocardiogram has been performed.  Fidel Levy 10/17/2020, 8:35 AM

## 2020-10-17 NOTE — Plan of Care (Signed)

## 2020-10-17 NOTE — Progress Notes (Signed)
VASCULAR LAB    Bilateral lower extremity venous duplex has been performed.  See CV proc for preliminary results.   Bubba Vanbenschoten, RVT 10/17/2020, 4:43 PM

## 2020-10-19 DIAGNOSIS — Z7984 Long term (current) use of oral hypoglycemic drugs: Secondary | ICD-10-CM | POA: Diagnosis not present

## 2020-10-19 DIAGNOSIS — M545 Low back pain, unspecified: Secondary | ICD-10-CM | POA: Diagnosis not present

## 2020-10-19 DIAGNOSIS — E119 Type 2 diabetes mellitus without complications: Secondary | ICD-10-CM | POA: Diagnosis not present

## 2020-10-27 ENCOUNTER — Other Ambulatory Visit: Payer: Self-pay | Admitting: Family Medicine

## 2020-10-27 DIAGNOSIS — Z1231 Encounter for screening mammogram for malignant neoplasm of breast: Secondary | ICD-10-CM

## 2020-10-27 DIAGNOSIS — E2839 Other primary ovarian failure: Secondary | ICD-10-CM

## 2020-11-07 NOTE — Progress Notes (Signed)
Cardiology Clinic Note   Patient Name: Karen Scott Date of Encounter: 11/09/2020  Primary Care Provider:  Cari Caraway, MD Primary Cardiologist:  Evalina Field, MD  Patient Profile    Karen Scott 65 year old female presents to the clinic today for follow-up evaluation of her chest discomfort and hypertension.  Past Medical History    Past Medical History:  Diagnosis Date   Abdominal wall hernia    Adenomatous colon polyp    Anxiety    CAD (coronary artery disease)    Carpal tunnel syndrome    Diabetes mellitus without complication (HCC)    Diverticulosis    DVT (deep venous thrombosis) (HCC)    Esophageal stricture    GERD (gastroesophageal reflux disease)    Hepatic steatosis    Hyperlipidemia    Hypertension    Internal hemorrhoids    Ovarian cyst    Shingles    Past Surgical History:  Procedure Laterality Date   ANKLE SURGERY     left ankle ligament repair   BREAST SURGERY     biopsy   CHOLECYSTECTOMY     JOINT REPLACEMENT  2008    Allergies  Allergies  Allergen Reactions   Erythromycin     All in this class   Penicillins     All "myocins" : unknown Has patient had a PCN reaction causing immediate rash, facial/tongue/throat swelling, SOB or lightheadedness with hypotension: n/a Has patient had a PCN reaction causing severe rash involving mucus membranes or skin necrosis: n/a Has patient had a PCN reaction that required hospitalization: n/a Has patient had a PCN reaction occurring within the last 10 years: n/a If all of the above answers are "NO", then may proceed with Cephalosporin use.    Tramadol     History of Present Illness    Karen Scott has a PMH of hypertension, type 2 diabetes, CVA, Jerrye Bushy, and atypical chest pain.  She also has history of unprovoked right lower extremity DVT in 2012.  She reports that she had a cardiac catheterization 20 years ago with stent placement.  She presented to the emergency department on 10/16/2020 with  complaints of sudden onset left-sided chest pain with radiation to her left arm and upper back.  She reported the pain had been present 45 minutes prior to arrival.  She reported that she has been sitting down both her legs upright when she experiencing sudden pain.  She reported that the pain felt similar to the pain that she had prior to her stent placement 20 years prior.  She reported shortness of breath on exertion and mild shortness of breath at rest.  She did note some lower extremity swelling in her left lower leg.  She took an aspirin and contacted EMS who gave her additional aspirin and nitroglycerin.  She continued to have chest discomfort.  She denied tobacco or alcohol use.  She denied any manual labor or lifting of heavy objects.  Her EKG showed normal sinus rhythm with left axis similar to previous EKGs.  Her CXR was unremarkable.  She did not notice any improvement with sublingual nitroglycerin.  Her troponins were negative x2.  Her CTA chest was negative for PE and other acute abnormalities.  Cardiology was consulted and recommended outpatient follow-up for possible stress test versus coronary CTA.  She presents to the clinic today for follow-up evaluation states she feels well.  She reports that she had 1 episode of light chest discomfort that lasted less than 30 minutes  after she left the emergency department.  We reviewed her troponin levels, EKG, and echocardiogram.  We also reviewed her cholesterol panel.  It does not appear that her chest discomfort was related to cardiac issues.  She reports that she is not as physically active as she feels she could be.  She does however walk her dog daily and provide childcare for her grandchild.  She reports that she has been self-employed as a Biochemist, clinical which interpreter and does not have regular doctors visits.  I will have her set a goal of 150 minutes of moderate physical activity per week.  She denies chest discomfort with physical activity.  I will  give her the salty 6 diet sheet, and have her follow-up in 12 months.  Today she denies chest pain, shortness of breath, lower extremity edema, fatigue, palpitations, melena, hematuria, hemoptysis, diaphoresis, weakness, presyncope, syncope, orthopnea, and PND.   Home Medications    Prior to Admission medications   Medication Sig Start Date End Date Taking? Authorizing Provider  acetaminophen (TYLENOL) 500 MG tablet Take 1,000 mg by mouth every 6 (six) hours as needed for moderate pain.    [provider]  aspirin 81 MG chewable tablet Chew 81 mg by mouth every morning.    [provider]  atorvastatin (LIPITOR) 20 MG tablet Take 20 mg by mouth every evening.     [provider]  diclofenac Sodium (VOLTAREN) 1 % GEL Apply 4 g topically 4 (four) times daily. To areas of chest and back pain. 10/17/20   Ezekiel Slocumb, DO  dicyclomine (BENTYL) 20 MG tablet Take 20 mg by mouth daily as needed for spasms. 02/20/20   [provider]  FLUoxetine (PROZAC) 20 MG capsule Take 3 capsules (60 mg total) by mouth daily. Patient taking differently: Take 40 mg by mouth daily. 05/31/20   Lomax, Amy, NP  ibuprofen (ADVIL) 200 MG tablet Take 2-3 tablets (400-600 mg total) by mouth every 6 (six) hours as needed for mild pain or moderate pain. Take with food. 10/17/20   Ezekiel Slocumb, DO  losartan-hydrochlorothiazide (HYZAAR) 100-25 MG per tablet Take 1 tablet by mouth every morning.     [provider]  metFORMIN (GLUCOPHAGE-XR) 500 MG 24 hr tablet Take 500 mg by mouth 2 (two) times daily.    [provider]  Omega-3 Fatty Acids (FISH OIL PO) Take 1 capsule by mouth daily.    [provider]  Pantoprazole Sodium (PROTONIX PO) Take 1 tablet by mouth 2 (two) times daily.    [provider]  rizatriptan (MAXALT-MLT) 10 MG disintegrating tablet Take 1 tablet (10 mg total) by mouth as needed for migraine. May repeat in 2 hours if needed Patient  taking differently: Take 10 mg by mouth daily as needed for migraine. May repeat in 2 hours if needed 05/31/20   Lomax, Amy, NP  therapeutic multivitamin-minerals [facility]) tablet Take 1 tablet by mouth daily.    [provider]    Family History    Family History  Problem Relation Age of Onset   Diabetes Mother    Hyperlipidemia Mother    Hypertension Mother    Stroke Mother    Lung cancer Father    Hearing loss Maternal Grandmother    Hearing loss Paternal Grandmother    Hearing loss Paternal Grandfather    She indicated that her mother is alive. She indicated that her father is deceased. She indicated that her maternal grandmother is deceased. She indicated that  her maternal grandfather is deceased. She indicated that her paternal grandmother is deceased. She indicated that her paternal grandfather is deceased.  Social History    Social History   Socioeconomic History   Marital status: Widowed    Spouse name: Not on file   Number of children: 2   Years of education: Not on file   Highest education level: Some college, no degree  Occupational History   Occupation: [employer]  Tobacco Use   Smoking status: Never   Smokeless tobacco: Never  Vaping Use   Vaping Use: Never used  Substance and Sexual Activity   Alcohol use: No    Alcohol/week: 0.0 standard drinks    Comment: socially   Drug use: No   Sexual activity: Not on file  Other Topics Concern   Not on file  Social History Narrative   Lives at home with her son   Right handed   Caffeine: 2-3 cups in the mornings   Social Determinants of Health   Financial Resource Strain: Not on file  Food Insecurity: Not on file  Transportation Needs: Not on file  Physical Activity: Not on file  Stress: Not on file  Social Connections: Not on file  Intimate Partner Violence: Not on file     Review of Systems    General:  No chills, fever, night sweats or weight changes.  Cardiovascular:  No  chest pain, dyspnea on exertion, edema, orthopnea, palpitations, paroxysmal nocturnal dyspnea. Dermatological: No rash, lesions/masses Respiratory: No cough, dyspnea Urologic: No hematuria, dysuria Abdominal:   No nausea, vomiting, diarrhea, bright red blood per rectum, melena, or hematemesis Neurologic:  No visual changes, wkns, changes in mental status. All other systems reviewed and are otherwise negative except as noted above.  Physical Exam    VS:  BP 128/82   Pulse 96   Ht 5\' 3"  (1.6 m)   Wt 159 lb (72.1 kg)   SpO2 96%   BMI 28.17 kg/m  , BMI Body mass index is 28.17 kg/m. GEN: Well nourished, well developed, in no acute distress. HEENT: normal. Neck: Supple, no JVD, carotid bruits, or masses. Cardiac: RRR, no murmurs, rubs, or gallops. No clubbing, cyanosis, edema.  Radials/DP/PT 2+ and equal bilaterally.  Respiratory:  Respirations regular and unlabored, clear to auscultation bilaterally. GI: Soft, nontender, nondistended, BS + x 4. MS: no deformity or atrophy. Skin: warm and dry, no rash. Neuro:  Strength and sensation are intact. Psych: Normal affect.  Accessory Clinical Findings    Recent Labs: 10/16/2020: ALT 15; Magnesium 1.7 10/17/2020: BUN 17; Creatinine, Ser 0.94; Hemoglobin 11.7; Platelets 200; Potassium 3.3; Sodium 137   Recent Lipid Panel    Component Value Date/Time   CHOL 93 10/16/2020 1725   TRIG 96 10/16/2020 1725   HDL 24 (L) 10/16/2020 1725   CHOLHDL 3.9 10/16/2020 1725   VLDL 19 10/16/2020 1725   LDLCALC 50 10/16/2020 1725    ECG personally reviewed by me today-none today.  Echocardiogram 10/17/2020 IMPRESSIONS     1. Left ventricular ejection fraction, by estimation, is 60 to 65%. The  left ventricle has normal function. The left ventricle has no regional  wall motion abnormalities. Left ventricular diastolic parameters were  normal.   2. Right ventricular systolic function is normal. The right ventricular  size is normal. There is  normal pulmonary artery systolic pressure.   3. The mitral valve is grossly normal. trivial to mild mitral valve  regurgitation.   4. The aortic valve is  tricuspid. Aortic valve regurgitation is not  visualized.   5. The inferior vena cava is normal in size with greater than 50%  respiratory variability, suggesting right atrial pressure of 3 mmHg.   Comparison(s): No prior Echocardiogram.   Conclusion(s)/Recommendation(s): Otherwise normal echocardiogram, with  minor abnormalities described in the report.   Assessment & Plan   1.  Left-sided chest pain/atypical chest pain-presented to the emergency department 10/16/2020 troponins negative, EKG no acute changes.  Felt to be atypical/MSK.Marland Kitchen  No previous CAD.  Her chest discomfort does not appear to be related to cardiac issues. Increase physical activity as tolerated-goal 150 minutes of moderate physical activity per week. Heart healthy low-sodium diet Continue to monitor  Essential hypertension-BP today 128/82.  Well-controlled at home. Continue losartan, HCTZ, amlodipine, as needed Lopressor Heart healthy low-sodium diet-salty 6 given Increase physical activity as tolerated  Hyperlipidemia-10/16/2020: Cholesterol 93; HDL 24; LDL Cholesterol 50; Triglycerides 96; VLDL 19 Continue atorvastatin, aspirin Heart healthy low-sodium high-fiber diet.   Increase physical activity as tolerated  Type 2 diabetes -glucose 151 on 10/17/2020, hemoglobin A1c 8.5 on 10/16/2020 Continue metformin Heart healthy low-sodium diet-salty 6 given Increase physical activity as tolerated Follows with PCP  Disposition: Follow-up with Dr. Audie Box or me in 12 months     Jossie Ng. Algernon Mundie NP-C    11/09/2020, 3:36 PM [place] [address] Office [phone] Fax [phone]  Notice: This dictation was prepared with Dragon dictation along with smaller phrase technology. Any transcriptional errors that result from  this process are unintentional and may not be corrected upon review.  I spent 15 minutes examining this patient, reviewing medications, and using patient centered shared decision making involving her cardiac care.  Prior to her visit I spent greater than 20 minutes reviewing her past medical history,  medications, and prior cardiac tests.

## 2020-11-09 ENCOUNTER — Ambulatory Visit (INDEPENDENT_AMBULATORY_CARE_PROVIDER_SITE_OTHER): Payer: Medicare Other | Admitting: General Practice

## 2020-11-09 ENCOUNTER — Encounter: Payer: Self-pay | Admitting: General Practice

## 2020-11-09 ENCOUNTER — Other Ambulatory Visit: Payer: Self-pay

## 2020-11-09 VITALS — BP 128/82 | HR 96 | Ht 63.0 in | Wt 159.0 lb

## 2020-11-09 DIAGNOSIS — I10 Essential (primary) hypertension: Secondary | ICD-10-CM | POA: Diagnosis not present

## 2020-11-09 DIAGNOSIS — E7800 Pure hypercholesterolemia, unspecified: Secondary | ICD-10-CM

## 2020-11-09 DIAGNOSIS — E0800 Diabetes mellitus due to underlying condition with hyperosmolarity without nonketotic hyperglycemic-hyperosmolar coma (NKHHC): Secondary | ICD-10-CM | POA: Diagnosis not present

## 2020-11-09 DIAGNOSIS — R0789 Other chest pain: Secondary | ICD-10-CM

## 2020-11-09 NOTE — Patient Instructions (Signed)
Medication Instructions:  The current medical regimen is effective;  continue present plan and medications as directed. Please refer to the Current Medication list given to you today. *If you need a refill on your cardiac medications before your next appointment, please call your pharmacy*  Lab Work:   Testing/Procedures:  NONE    NONE  Special Instructions PLEASE READ AND FOLLOW SALTY 6-ATTACHED-1,800mg  daily  PLEASE INCREASE PHYSICAL ACTIVITY AS TOLERATED-150 MINUTES OF MODERATE ACTIVITY A  Follow-Up: Your next appointment:  12 month(s) In Person with You may see Evalina Field, MDR IF UNAVAILABLE JESSE CLEAVER, FNP-C  or one of the following Advanced Practice Providers on your designated Care Team:  Almyra Deforest, PA-C  Fabian Sharp, [place] or Roby Lofts, PA-C   Please call our office 2 months in advance to schedule this appointment   At [facility], you and your health needs are our priority.  As part of our continuing mission to provide you with exceptional heart care, we have created designated Provider Care Teams.  These Care Teams include your primary Cardiologist (physician) and Advanced Practice Providers (APPs -  Physician Assistants and Nurse Practitioners) who all work together to provide you with the care you need, when you need it.  We recommend signing up for the patient portal called "MyChart".  Sign up information is provided on this After Visit Summary.  MyChart is used to connect with patients for Virtual Visits (Telemedicine).  Patients are able to view lab/test results, encounter notes, upcoming appointments, etc.  Non-urgent messages can be sent to your provider as well.   To learn more about what you can do with MyChart, go to [email or web address].              6 SALTY THINGS TO AVOID     1,800MG  DAILY

## 2020-12-09 ENCOUNTER — Other Ambulatory Visit: Payer: Medicare Other

## 2020-12-20 ENCOUNTER — Encounter: Payer: Self-pay | Admitting: Internal Medicine

## 2020-12-28 ENCOUNTER — Ambulatory Visit (AMBULATORY_SURGERY_CENTER): Payer: Medicare Other | Admitting: *Deleted

## 2020-12-28 ENCOUNTER — Other Ambulatory Visit: Payer: Self-pay

## 2020-12-28 VITALS — Ht 63.0 in | Wt 146.0 lb

## 2020-12-28 DIAGNOSIS — Z86010 Personal history of colonic polyps: Secondary | ICD-10-CM

## 2020-12-28 MED ORDER — NA SULFATE-K SULFATE-MG SULF 17.5-3.13-1.6 GM/177ML PO SOLN: 1.0000 | Freq: Once | ORAL | 0 refills | Status: AC

## 2020-12-28 NOTE — Progress Notes (Signed)
Conducted virtual pre visit. Instructions sent through e-mail [email or web address]  No egg or soy allergy known to patient  No issues with past sedation with any surgeries or procedures Patient denies ever being told they had issues or difficulty with intubation  No FH of Malignant Hyperthermia No diet pills per patient No home 02 use per patient  No blood thinners per patient  Pt denies issues with constipation  No A fib or A flutter  EMMI video to pt or via MyChart  COVID 19 guidelines implemented in [place] today with Pt and RN  Pt is fully vaccinated  for Covid   Discussed with pt there will be an out-of-pocket cost for prep and that varies from $0 to 70 dollars   Due to the COVID-19 pandemic we are asking patients to follow certain guidelines.  Pt aware of COVID protocols and [facility] guidelines

## 2021-01-03 ENCOUNTER — Telehealth: Payer: Self-pay | Admitting: Internal Medicine

## 2021-01-03 DIAGNOSIS — Z86010 Personal history of colonic polyps: Secondary | ICD-10-CM

## 2021-01-03 MED ORDER — PLENVU 140 G PO SOLR: 1.0000 | Freq: Once | ORAL | 0 refills | Status: AC

## 2021-01-03 NOTE — Telephone Encounter (Signed)
Patient was offered Plenvu with medicare coupon-she will come by the 3rd floor to pick up new plenvu prep instructions and the medicare coupon-pt aware she will activate the coupon before taking to pharmacy. Plenvu Rx sent to pt's pharmacy- pt aware.

## 2021-01-03 NOTE — Telephone Encounter (Signed)
Inbound call from patient. States suprep was over $100. Asks if there are any coupons or alternatives that are cheaper.

## 2021-01-12 ENCOUNTER — Ambulatory Visit (AMBULATORY_SURGERY_CENTER): Payer: Medicare Other | Admitting: Internal Medicine

## 2021-01-12 ENCOUNTER — Encounter: Payer: Self-pay | Admitting: Internal Medicine

## 2021-01-12 ENCOUNTER — Ambulatory Visit: Payer: Medicare Other

## 2021-01-12 ENCOUNTER — Other Ambulatory Visit: Payer: Self-pay

## 2021-01-12 VITALS — BP 118/82 | HR 79 | Temp 97.7°F | Resp 13 | Wt 146.0 lb

## 2021-01-12 DIAGNOSIS — D124 Benign neoplasm of descending colon: Secondary | ICD-10-CM

## 2021-01-12 DIAGNOSIS — D123 Benign neoplasm of transverse colon: Secondary | ICD-10-CM

## 2021-01-12 DIAGNOSIS — Z86010 Personal history of colonic polyps: Secondary | ICD-10-CM

## 2021-01-12 MED ORDER — SODIUM CHLORIDE 0.9 % IV SOLN: 500.0000 mL | Freq: Once | INTRAVENOUS | Status: DC

## 2021-01-12 NOTE — Progress Notes (Signed)
A and O x3. Report to RN. Tolerated MAC anesthesia well. 

## 2021-01-12 NOTE — Patient Instructions (Signed)
Handouts given for polyps, diverticulosis and hemorrhoids.  Await pathology results.  YOU HAD AN ENDOSCOPIC PROCEDURE TODAY AT THE [facility]:   Refer to the procedure report that was given to you for any specific questions about what was found during the examination.  If the procedure report does not answer your questions, please call your gastroenterologist to clarify.  If you requested that your care partner not be given the details of your procedure findings, then the procedure report has been included in a sealed envelope for you to review at your convenience later.  YOU SHOULD EXPECT: Some feelings of bloating in the abdomen. Passage of more gas than usual.  Walking can help get rid of the air that was put into your GI tract during the procedure and reduce the bloating. If you had a lower endoscopy (such as a colonoscopy or flexible sigmoidoscopy) you may notice spotting of blood in your stool or on the toilet paper. If you underwent a bowel prep for your procedure, you may not have a normal bowel movement for a few days.  Please Note:  You might notice some irritation and congestion in your nose or some drainage.  This is from the oxygen used during your procedure.  There is no need for concern and it should clear up in a day or so.  SYMPTOMS TO REPORT IMMEDIATELY:   Following lower endoscopy (colonoscopy or flexible sigmoidoscopy):  Excessive amounts of blood in the stool  Significant tenderness or worsening of abdominal pains  Swelling of the abdomen that is new, acute  Fever of 100F or higher   For urgent or emergent issues, a gastroenterologist can be reached at any hour by calling [phone]. Do not use MyChart messaging for urgent concerns.    DIET:  We do recommend a small meal at first, but then you may proceed to your regular diet.  Drink plenty of fluids but you should avoid alcoholic beverages for 24 hours.  ACTIVITY:  You should plan to take it easy for  the rest of today and you should NOT DRIVE or use heavy machinery until tomorrow (because of the sedation medicines used during the test).    FOLLOW UP: Our staff will call the number listed on your records 48-72 hours following your procedure to check on you and address any questions or concerns that you may have regarding the information given to you following your procedure. If we do not reach you, we will leave a message.  We will attempt to reach you two times.  During this call, we will ask if you have developed any symptoms of COVID 19. If you develop any symptoms (ie: fever, flu-like symptoms, shortness of breath, cough etc.) before then, please call [phone].  If you test positive for Covid 19 in the 2 weeks post procedure, please call and report this information to us.    If any biopsies were taken you will be contacted by phone or by letter within the next 1-3 weeks.  Please call us at [phone] if you have not heard about the biopsies in 3 weeks.    SIGNATURES/CONFIDENTIALITY: You and/or your care partner have signed paperwork which will be entered into your electronic medical record.  These signatures attest to the fact that that the information above on your After Visit Summary has been reviewed and is understood.  Full responsibility of the confidentiality of this discharge information lies with you and/or your care-partner. 

## 2021-01-12 NOTE — Op Note (Signed)
[facility] Patient Name: Karen Scott Procedure Date: 01/12/2021 1:31 PM MRN: [id number] Endoscopist: Jerene Bears , MD Age: 65 Referring MD:  Date of Birth: [birth year] Gender: Female Account #: [id number] Procedure:                Colonoscopy Indications:              High risk colon cancer surveillance: Personal                            history of non-advanced adenomas, Last colonoscopy:                            February 2017 Medicines:                Monitored Anesthesia Care Procedure:                Pre-Anesthesia Assessment:                           - Prior to the procedure, a History and Physical                            was performed, and patient medications and                            allergies were reviewed. The patient's tolerance of                            previous anesthesia was also reviewed. The risks                            and benefits of the procedure and the sedation                            options and risks were discussed with the patient.                            All questions were answered, and informed consent                            was obtained. Prior Anticoagulants: The patient has                            taken no previous anticoagulant or antiplatelet                            agents. ASA Grade Assessment: III - A patient with                            severe systemic disease. After reviewing the risks                            and benefits, the patient was deemed in  satisfactory condition to undergo the procedure.                           After obtaining informed consent, the colonoscope                            was passed under direct vision. Throughout the                            procedure, the patient's blood pressure, pulse, and                            oxygen saturations were monitored continuously. The                            Olympus PCF-H190DL [id number]) Colonoscope was                             introduced through the anus and advanced to the                            cecum, identified by appendiceal orifice and                            ileocecal valve. The colonoscopy was performed                            without difficulty. The patient tolerated the                            procedure well. The quality of the bowel                            preparation was good. The ileocecal valve,                            appendiceal orifice, and rectum were photographed. Scope In: 1:42:41 PM Scope Out: 1:55:53 PM Scope Withdrawal Time: 0 hours 11 minutes 22 seconds  Total Procedure Duration: 0 hours 13 minutes 12 seconds  Findings:                 The digital rectal exam was normal.                           Two sessile polyps were found in the descending                            colon and transverse colon. The polyps were 2 to 4                            mm in size. These polyps were removed with a cold                            snare. Resection and retrieval were complete.  Multiple small and large-mouthed diverticula were                            found in the sigmoid colon and descending colon.                           Internal hemorrhoids were found during                            retroflexion. The hemorrhoids were medium-sized. Complications:            No immediate complications. Estimated Blood Loss:     Estimated blood loss was minimal. Impression:               - Two 2 to 4 mm polyps in the descending colon and                            in the transverse colon, removed with a cold snare.                            Resected and retrieved.                           - Moderate diverticulosis in the sigmoid colon and                            in the descending colon.                           - Internal hemorrhoids. Recommendation:           - Patient has a contact number available for                             emergencies. The signs and symptoms of potential                            delayed complications were discussed with the                            patient. Return to normal activities tomorrow.                            Written discharge instructions were provided to the                            patient.                           - Resume previous diet.                           - Continue present medications.                           - Await pathology results.                           -  Repeat colonoscopy is recommended for                            surveillance. The colonoscopy date will be                            determined after pathology results from today's                            exam become available for review. Jerene Bears, MD 01/12/2021 1:59:21 PM This report has been signed electronically.

## 2021-01-12 NOTE — Progress Notes (Signed)
GASTROENTEROLOGY PROCEDURE H&P NOTE   Primary Care Physician: Cari Caraway, MD    Reason for Procedure:   Colonoscopy for hx of polyps    Plan:    colonoscopy  Patient is appropriate for endoscopic procedure(s) in the ambulatory ([facility]) setting.  The nature of the procedure, as well as the risks, benefits, and alternatives were carefully and thoroughly reviewed with the patient. Ample time for discussion and questions allowed. The patient understood, was satisfied, and agreed to proceed.     HPI: Karen Scott is a 65 y.o. female who presents for surveillance colonoscopy.  History as below.  No complaints today including no abd pain, cp, or dysnea.  Past Medical History:  Diagnosis Date   Abdominal wall hernia    Adenomatous colon polyp    Anxiety    CAD (coronary artery disease)    Carpal tunnel syndrome    Clotting disorder (HCC)    Diabetes mellitus without complication (HCC)    Diverticulosis    DVT (deep venous thrombosis) (HCC)    Esophageal stricture    GERD (gastroesophageal reflux disease)    Hepatic steatosis    Hyperlipidemia    Hypertension    Internal hemorrhoids    Ovarian cyst    Shingles     Past Surgical History:  Procedure Laterality Date   ANKLE SURGERY     left ankle ligament repair   BREAST SURGERY     biopsy   CHOLECYSTECTOMY     JOINT REPLACEMENT  2008    Prior to Admission medications   Medication Sig Start Date End Date Taking? Authorizing Provider  acetaminophen (TYLENOL) 500 MG tablet Take 1,000 mg by mouth every 6 (six) hours as needed for moderate pain.   Yes [provider]  aspirin 81 MG chewable tablet Chew 81 mg by mouth every morning.    [provider]  atorvastatin (LIPITOR) 20 MG tablet Take 20 mg by mouth every evening.     [provider]  diclofenac Sodium (VOLTAREN) 1 % GEL Apply 4 g topically 4 (four) times daily. To areas of chest and back pain. Patient not taking: Reported on 01/12/2021  10/17/20   Nicole Kindred A, DO  dicyclomine (BENTYL) 20 MG tablet Take 20 mg by mouth daily as needed for spasms. 02/20/20   [provider]  FLUoxetine (PROZAC) 20 MG capsule Take 3 capsules (60 mg total) by mouth daily. Patient taking differently: Take 40 mg by mouth daily. 05/31/20   Lomax, Amy, NP  ibuprofen (ADVIL) 200 MG tablet Take 2-3 tablets (400-600 mg total) by mouth every 6 (six) hours as needed for mild pain or moderate pain. Take with food. 10/17/20   Ezekiel Slocumb, DO  losartan-hydrochlorothiazide (HYZAAR) 100-25 MG per tablet Take 1 tablet by mouth every morning.     [provider]  metFORMIN (GLUCOPHAGE-XR) 500 MG 24 hr tablet Take 500 mg by mouth 2 (two) times daily.    [provider]  Omega-3 Fatty Acids (FISH OIL PO) Take 1 capsule by mouth daily.    [provider]  Pantoprazole Sodium (PROTONIX PO) Take 1 tablet by mouth 2 (two) times daily.    [provider]  rizatriptan (MAXALT-MLT) 10 MG disintegrating tablet Take 1 tablet (10 mg total) by mouth as needed for migraine. May repeat in 2 hours if needed Patient taking differently: Take 10 mg by mouth daily as needed for migraine. May repeat in 2 hours if needed 05/31/20   Lomax,  Amy, NP  therapeutic multivitamin-minerals (THERAGRAN-M) tablet Take 1 tablet by mouth daily.    [provider]    Current Outpatient Medications  Medication Sig Dispense Refill   acetaminophen (TYLENOL) 500 MG tablet Take 1,000 mg by mouth every 6 (six) hours as needed for moderate pain.     aspirin 81 MG chewable tablet Chew 81 mg by mouth every morning.     atorvastatin (LIPITOR) 20 MG tablet Take 20 mg by mouth every evening.      diclofenac Sodium (VOLTAREN) 1 % GEL Apply 4 g topically 4 (four) times daily. To areas of chest and back pain. (Patient not taking: Reported on 01/12/2021)     dicyclomine (BENTYL) 20 MG tablet Take 20 mg by mouth daily as needed for spasms.     FLUoxetine  (PROZAC) 20 MG capsule Take 3 capsules (60 mg total) by mouth daily. (Patient taking differently: Take 40 mg by mouth daily.) 270 capsule 3   ibuprofen (ADVIL) 200 MG tablet Take 2-3 tablets (400-600 mg total) by mouth every 6 (six) hours as needed for mild pain or moderate pain. Take with food.     losartan-hydrochlorothiazide (HYZAAR) 100-25 MG per tablet Take 1 tablet by mouth every morning.      metFORMIN (GLUCOPHAGE-XR) 500 MG 24 hr tablet Take 500 mg by mouth 2 (two) times daily.     Omega-3 Fatty Acids (FISH OIL PO) Take 1 capsule by mouth daily.     Pantoprazole Sodium (PROTONIX PO) Take 1 tablet by mouth 2 (two) times daily.     rizatriptan (MAXALT-MLT) 10 MG disintegrating tablet Take 1 tablet (10 mg total) by mouth as needed for migraine. May repeat in 2 hours if needed (Patient taking differently: Take 10 mg by mouth daily as needed for migraine. May repeat in 2 hours if needed) 9 tablet 11   therapeutic multivitamin-minerals (THERAGRAN-M) tablet Take 1 tablet by mouth daily.     Current Facility-Administered Medications  Medication Dose Route Frequency Provider Last Rate Last Admin   0.9 %  sodium chloride infusion  500 mL Intravenous Once Kaylyn Garrow, Lajuan Lines, MD        Allergies as of 01/12/2021 - Review Complete 01/12/2021  Allergen Reaction Noted   Erythromycin  10/12/2013   Penicillins  09/07/2012   Tramadol  09/26/2016    Family History  Problem Relation Age of Onset   Pancreatic cancer Mother    Diabetes Mother    Hyperlipidemia Mother    Hypertension Mother    Stroke Mother    Lung cancer Father    Hearing loss Maternal Grandmother    Hearing loss Paternal Grandmother    Hearing loss Paternal Grandfather    Colon cancer Neg Hx    Colon polyps Neg Hx    Esophageal cancer Neg Hx    Rectal cancer Neg Hx    Stomach cancer Neg Hx     Social History   Socioeconomic History   Marital status: Widowed    Spouse name: Not on file   Number of children: 2   Years of  education: Not on file   Highest education level: Some college, no degree  Occupational History   Occupation: [employer]  Tobacco Use   Smoking status: Never   Smokeless tobacco: Never  Vaping Use   Vaping Use: Never used  Substance and Sexual Activity   Alcohol use: No    Alcohol/week: 0.0 standard drinks    Comment: socially   Drug use:  No   Sexual activity: Not on file  Other Topics Concern   Not on file  Social History Narrative   Lives at home with her son   Right handed   Caffeine: 2-3 cups in the mornings   Social Determinants of Health   Financial Resource Strain: Not on file  Food Insecurity: Not on file  Transportation Needs: Not on file  Physical Activity: Not on file  Stress: Not on file  Social Connections: Not on file  Intimate Partner Violence: Not on file    Physical Exam: Vital signs in last 24 hours: '@BP'$  (!) 144/78   Pulse 82   Temp 97.7 F (36.5 C)   Resp 16   Wt 146 lb (66.2 kg)   SpO2 98%   BMI 25.86 kg/m  GEN: NAD EYE: Sclerae anicteric ENT: MMM CV: Non-tachycardic Pulm: CTA b/l GI: Soft, NT/ND NEURO:  Alert & Oriented x 3   Zenovia Jarred, MD [facility]  01/12/2021 1:37 PM

## 2021-01-12 NOTE — Progress Notes (Signed)
Called to room to assist during endoscopic procedure.  Patient ID and intended procedure confirmed with present staff. Received instructions for my participation in the procedure from the performing physician.  

## 2021-01-12 NOTE — Progress Notes (Signed)
Pt's states no medical or surgical changes since previsit or office visit.   Check-in-AER  V/S-CW

## 2021-01-14 ENCOUNTER — Telehealth: Payer: Self-pay | Admitting: *Deleted

## 2021-01-14 ENCOUNTER — Telehealth: Payer: Self-pay

## 2021-01-14 NOTE — Telephone Encounter (Signed)
First attempt follow up call to pt, no answer. 

## 2021-01-14 NOTE — Telephone Encounter (Signed)
  Follow up Call-  Call back number 01/12/2021  Post procedure Call Back phone  # [phone]  Permission to leave phone message Yes  Some recent data might be hidden   LMOM to call back with any questions or concerns.  Also, call back if patient has developed fever, respiratory issues or been dx with COVID or had any family members or close contacts diagnosed since her procedure.

## 2021-01-18 ENCOUNTER — Encounter: Payer: Self-pay | Admitting: Internal Medicine

## 2021-02-28 ENCOUNTER — Ambulatory Visit
Admission: RE | Admit: 2021-02-28 | Discharge: 2021-02-28 | Disposition: A | Discharge status: Home / Self Care | Payer: Medicare Other | Source: Ambulatory Visit | Attending: Family Medicine | Admitting: Family Medicine

## 2021-02-28 ENCOUNTER — Other Ambulatory Visit: Payer: Self-pay

## 2021-02-28 DIAGNOSIS — Z1231 Encounter for screening mammogram for malignant neoplasm of breast: Secondary | ICD-10-CM

## 2021-03-04 ENCOUNTER — Other Ambulatory Visit: Payer: Self-pay | Admitting: Family Medicine

## 2021-03-04 DIAGNOSIS — R928 Other abnormal and inconclusive findings on diagnostic imaging of breast: Secondary | ICD-10-CM

## 2021-03-12 ENCOUNTER — Other Ambulatory Visit: Payer: Self-pay | Admitting: Family Medicine

## 2021-03-12 ENCOUNTER — Ambulatory Visit
Admission: RE | Admit: 2021-03-12 | Discharge: 2021-03-12 | Disposition: A | Discharge status: Home / Self Care | Payer: Medicare Other | Source: Ambulatory Visit | Attending: Family Medicine | Admitting: Family Medicine

## 2021-03-12 DIAGNOSIS — R928 Other abnormal and inconclusive findings on diagnostic imaging of breast: Secondary | ICD-10-CM

## 2021-04-02 ENCOUNTER — Other Ambulatory Visit: Payer: Self-pay

## 2021-04-02 ENCOUNTER — Emergency Department (HOSPITAL_BASED_OUTPATIENT_CLINIC_OR_DEPARTMENT_OTHER): Payer: Medicare Other

## 2021-04-02 ENCOUNTER — Encounter (HOSPITAL_COMMUNITY): Payer: Self-pay | Admitting: Emergency Medicine

## 2021-04-02 ENCOUNTER — Emergency Department (HOSPITAL_COMMUNITY)
Admission: EM | Admit: 2021-04-02 | Discharge: 2021-04-02 | Disposition: A | Discharge status: Home / Self Care | Payer: Medicare Other | Attending: Emergency Medicine | Admitting: Emergency Medicine

## 2021-04-02 DIAGNOSIS — I10 Essential (primary) hypertension: Secondary | ICD-10-CM | POA: Insufficient documentation

## 2021-04-02 DIAGNOSIS — Z7984 Long term (current) use of oral hypoglycemic drugs: Secondary | ICD-10-CM | POA: Insufficient documentation

## 2021-04-02 DIAGNOSIS — Z79899 Other long term (current) drug therapy: Secondary | ICD-10-CM | POA: Diagnosis not present

## 2021-04-02 DIAGNOSIS — Z7982 Long term (current) use of aspirin: Secondary | ICD-10-CM | POA: Diagnosis not present

## 2021-04-02 DIAGNOSIS — M79662 Pain in left lower leg: Secondary | ICD-10-CM | POA: Diagnosis not present

## 2021-04-02 DIAGNOSIS — I2510 Atherosclerotic heart disease of native coronary artery without angina pectoris: Secondary | ICD-10-CM | POA: Diagnosis not present

## 2021-04-02 DIAGNOSIS — M25562 Pain in left knee: Secondary | ICD-10-CM | POA: Diagnosis not present

## 2021-04-02 DIAGNOSIS — E1169 Type 2 diabetes mellitus with other specified complication: Secondary | ICD-10-CM | POA: Diagnosis not present

## 2021-04-02 DIAGNOSIS — M79605 Pain in left leg: Secondary | ICD-10-CM | POA: Diagnosis not present

## 2021-04-02 DIAGNOSIS — E785 Hyperlipidemia, unspecified: Secondary | ICD-10-CM | POA: Diagnosis not present

## 2021-04-02 MED ORDER — HYDROCODONE-ACETAMINOPHEN 5-325 MG PO TABS
1.0000 | ORAL_TABLET | Freq: Once | ORAL | Status: AC
  Administered 2021-04-02: 1 via ORAL
  Filled 2021-04-02: qty 1

## 2021-04-02 MED ORDER — HYDROCODONE-ACETAMINOPHEN 5-325 MG PO TABS: 1.0000 | ORAL_TABLET | ORAL | 0 refills | Status: DC | PRN

## 2021-04-02 NOTE — ED Provider Notes (Signed)
[facility] DEPT Provider Note   CSN: [id number] Arrival date & time: 04/02/21  1401     History Chief Complaint  Patient presents with   Leg Pain    Karen Scott is a 65 y.o. female.  65 year old female presents with complaint of pain in her left medial and posterior knee since yesterday.  States that she noticed that her knee has been locking and giving her difficulties and suspect she may need a knee replacement however this pain reminds her of when she had a DVT in this leg.  Denies recent falls or injuries, pain does not radiate.  Pain is worse with palpation.  States that if she is up on her feet walking around her leg will swell.  No other complaints or concerns, not currently anticoagulated.      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     Family History  Problem Relation Age of Onset   Pancreatic cancer Mother    Diabetes Mother    Hyperlipidemia Mother    Hypertension Mother    Stroke Mother    Lung cancer Father    Hearing loss Maternal Grandmother    Hearing loss  Paternal Grandmother    Hearing loss Paternal Grandfather    Colon cancer Neg Hx    Colon polyps Neg Hx    Esophageal cancer Neg Hx    Rectal cancer Neg Hx    Stomach cancer Neg Hx     Social History   Tobacco Use   Smoking status: Never   Smokeless tobacco: Never  Vaping Use   Vaping Use: Never used  Substance Use Topics   Alcohol use: No    Alcohol/week: 0.0 standard drinks    Comment: socially   Drug use: No    Home Medications Prior to Admission medications   Medication Sig Start Date End Date Taking? Authorizing Provider  HYDROcodone-acetaminophen (NORCO/VICODIN) 5-325 MG tablet Take 1 tablet by mouth every 4 (four) hours as needed. 04/02/21  Yes Tacy Learn, PA-C  aspirin 81 MG chewable tablet Chew 81 mg by mouth every morning.    [provider]  atorvastatin (LIPITOR) 20 MG tablet Take 20 mg by mouth every evening.     [provider]  diclofenac Sodium (VOLTAREN) 1 % GEL Apply 4 g topically 4 (four) times daily. To areas of chest and back pain. Patient not taking: Reported  on 01/12/2021 10/17/20   Ezekiel Slocumb, DO  dicyclomine (BENTYL) 20 MG tablet Take 20 mg by mouth daily as needed for spasms. 02/20/20   [provider]  FLUoxetine (PROZAC) 20 MG capsule Take 3 capsules (60 mg total) by mouth daily. Patient taking differently: Take 40 mg by mouth daily. 05/31/20   Lomax, Amy, NP  ibuprofen (ADVIL) 200 MG tablet Take 2-3 tablets (400-600 mg total) by mouth every 6 (six) hours as needed for mild pain or moderate pain. Take with food. 10/17/20   Ezekiel Slocumb, DO  losartan-hydrochlorothiazide (HYZAAR) 100-25 MG per tablet Take 1 tablet by mouth every morning.     [provider]  metFORMIN (GLUCOPHAGE-XR) 500 MG 24 hr tablet Take 500 mg by mouth 2 (two) times daily.    [provider]  Omega-3 Fatty Acids (FISH OIL PO) Take 1 capsule by mouth daily.    [provider]  Pantoprazole Sodium (PROTONIX PO) Take 1  tablet by mouth 2 (two) times daily.    [provider]  rizatriptan (MAXALT-MLT) 10 MG disintegrating tablet Take 1 tablet (10 mg total) by mouth as needed for migraine. May repeat in 2 hours if needed Patient taking differently: Take 10 mg by mouth daily as needed for migraine. May repeat in 2 hours if needed 05/31/20   Lomax, Amy, NP  therapeutic multivitamin-minerals [facility]) tablet Take 1 tablet by mouth daily.    [provider]    Allergies    Erythromycin, Penicillins, and Tramadol  Review of Systems   Review of Systems  Constitutional:  Negative for fever.  Respiratory:  Negative for shortness of breath.   Cardiovascular:  Negative for chest pain.  Musculoskeletal:  Positive for arthralgias, joint swelling and myalgias.  Skin:  Negative for color change, rash and wound.  Allergic/Immunologic: Positive for immunocompromised state.  Neurological:  Negative for weakness and numbness.  Hematological:  Does not bruise/bleed easily.  All other systems reviewed and are negative.  Physical Exam Updated Vital Signs BP (!) 142/94   Pulse 98   Temp 98.2 F (36.8 C) (Oral)   Resp 16   SpO2 97%   Physical Exam Vitals and nursing note reviewed.  Constitutional:      General: She is not in acute distress.    Appearance: She is well-developed. She is not diaphoretic.  HENT:     Head: Normocephalic and atraumatic.  Cardiovascular:     Pulses: Normal pulses.  Pulmonary:     Effort: Pulmonary effort is normal.  Musculoskeletal:        General: Swelling and tenderness present.     Right lower leg: No edema.     Left lower leg: No edema.     Comments: No significant SI joint tenderness. Tenderness to the medial and posterior left knee.  Varicose veins present.  No erythema.  No palpable cords.  Skin:    General: Skin is warm and dry.     Findings: No erythema or rash.  Neurological:     Mental Status: She is alert and oriented to person, place, and time.      Sensory: No sensory deficit.     Motor: No weakness.  Psychiatric:        Behavior: Behavior normal.    ED Results / Procedures / Treatments   Labs (all labs ordered are listed, but only abnormal results are displayed) Labs Reviewed - No data to display  EKG None  Radiology No results found.  Procedures Procedures   Medications Ordered in ED Medications  HYDROcodone-acetaminophen (NORCO/VICODIN) 5-325 MG per tablet 1 tablet (1 tablet Oral Given 04/02/21 1512)    ED Course  I have reviewed the triage vital signs and the nursing notes.  Pertinent labs & imaging results that were available during my care of the patient were reviewed by me and considered in my medical decision making (see chart for details).  Clinical Course as of 04/02/21 1722  Sat Apr 02, 7616  7425 65 year old female presents with complaint of acute left knee pain primarily to her medial and posterior left knee without recent injury.  Reports history of prior DVT, concerned that pain is similar.  On exam, found to have varicose veins with tenderness to the medial left knee as well as posterior left knee.  No palpable cords, no significant swelling at this time although she states if she ambulates she does swell. Doppler is negative for DVT.  Offered reassurance, pain is improved with Norco given in the ED.  We will give prescription for same.  Recommend patient follow-up with her orthopedic doctor to discuss neck steps in management of her knee pain. [LM]    Clinical Course User Index [LM] Roque Lias   MDM Rules/Calculators/A&P                           Final Clinical Impression(s) / ED Diagnoses Final diagnoses:  Acute pain of left knee    Rx / DC Orders ED Discharge Orders          Ordered    HYDROcodone-acetaminophen (NORCO/VICODIN) 5-325 MG tablet  Every 4 hours PRN        04/02/21 1720             Tacy Learn, PA-C 04/02/21 1722    Varney Biles, MD 04/02/21  1825

## 2021-04-02 NOTE — Discharge Instructions (Signed)
Take Norco as needed for pain not controlled with Voltaren topically.  Follow-up with your orthopedic doctor as discussed.

## 2021-04-02 NOTE — ED Triage Notes (Signed)
Patient presents with leg pain that has kept her up at night.Pain starts in the medial knee and radiates up the back of her leg. She has had this pain for about a week.

## 2021-04-02 NOTE — Progress Notes (Signed)
VASCULAR LAB    Left lower extremity venous duplex has been performed.  See CV proc for preliminary results.  Gave verbal report to Suella Broad, PA-C  Pete Schnitzer, [place], RVT 04/02/2021, 5:09 PM

## 2021-05-26 DIAGNOSIS — I152 Hypertension secondary to endocrine disorders: Secondary | ICD-10-CM | POA: Diagnosis not present

## 2021-05-26 DIAGNOSIS — E1159 Type 2 diabetes mellitus with other circulatory complications: Secondary | ICD-10-CM | POA: Diagnosis not present

## 2021-05-26 DIAGNOSIS — M1712 Unilateral primary osteoarthritis, left knee: Secondary | ICD-10-CM | POA: Diagnosis not present

## 2021-05-26 DIAGNOSIS — G8929 Other chronic pain: Secondary | ICD-10-CM | POA: Diagnosis not present

## 2021-05-26 DIAGNOSIS — I2510 Atherosclerotic heart disease of native coronary artery without angina pectoris: Secondary | ICD-10-CM | POA: Diagnosis not present

## 2021-05-26 DIAGNOSIS — K219 Gastro-esophageal reflux disease without esophagitis: Secondary | ICD-10-CM | POA: Diagnosis not present

## 2021-05-26 DIAGNOSIS — E782 Mixed hyperlipidemia: Secondary | ICD-10-CM | POA: Diagnosis not present

## 2021-06-01 DIAGNOSIS — R1032 Left lower quadrant pain: Secondary | ICD-10-CM | POA: Diagnosis not present

## 2021-06-01 DIAGNOSIS — R110 Nausea: Secondary | ICD-10-CM | POA: Diagnosis not present

## 2021-06-01 DIAGNOSIS — K580 Irritable bowel syndrome with diarrhea: Secondary | ICD-10-CM | POA: Diagnosis not present

## 2021-06-01 DIAGNOSIS — K5792 Diverticulitis of intestine, part unspecified, without perforation or abscess without bleeding: Secondary | ICD-10-CM | POA: Diagnosis not present

## 2021-06-06 DIAGNOSIS — M25562 Pain in left knee: Secondary | ICD-10-CM | POA: Diagnosis not present

## 2021-06-11 DIAGNOSIS — M25562 Pain in left knee: Secondary | ICD-10-CM | POA: Diagnosis not present

## 2021-06-20 ENCOUNTER — Other Ambulatory Visit (INDEPENDENT_AMBULATORY_CARE_PROVIDER_SITE_OTHER): Payer: Medicare Other

## 2021-06-20 ENCOUNTER — Encounter: Payer: Self-pay | Admitting: Physician Assistant

## 2021-06-20 ENCOUNTER — Ambulatory Visit (INDEPENDENT_AMBULATORY_CARE_PROVIDER_SITE_OTHER): Payer: Medicare Other | Admitting: Physician Assistant

## 2021-06-20 VITALS — BP 112/80 | HR 104 | Ht 62.0 in | Wt 152.1 lb

## 2021-06-20 DIAGNOSIS — R194 Change in bowel habit: Secondary | ICD-10-CM | POA: Diagnosis not present

## 2021-06-20 DIAGNOSIS — Z8719 Personal history of other diseases of the digestive system: Secondary | ICD-10-CM

## 2021-06-20 DIAGNOSIS — R109 Unspecified abdominal pain: Secondary | ICD-10-CM

## 2021-06-20 LAB — CBC WITH DIFFERENTIAL/PLATELET
Basophils Absolute: 0.1 10*3/uL (ref 0.0–0.1)
Basophils Relative: 0.6 % (ref 0.0–3.0)
Eosinophils Absolute: 0.1 10*3/uL (ref 0.0–0.7)
Eosinophils Relative: 0.9 % (ref 0.0–5.0)
HCT: 40.6 % (ref 36.0–46.0)
Hemoglobin: 13.7 g/dL (ref 12.0–15.0)
Lymphocytes Relative: 16.9 % (ref 12.0–46.0)
Lymphs Abs: 1.6 10*3/uL (ref 0.7–4.0)
MCHC: 33.7 g/dL (ref 30.0–36.0)
MCV: 82.8 fl (ref 78.0–100.0)
Monocytes Absolute: 0.6 10*3/uL (ref 0.1–1.0)
Monocytes Relative: 6.7 % (ref 3.0–12.0)
Neutro Abs: 6.9 10*3/uL (ref 1.4–7.7)
Neutrophils Relative %: 74.9 % (ref 43.0–77.0)
Platelets: 272 10*3/uL (ref 150.0–400.0)
RBC: 4.91 Mil/uL (ref 3.87–5.11)
RDW: 14.3 % (ref 11.5–15.5)
WBC: 9.2 10*3/uL (ref 4.0–10.5)

## 2021-06-20 LAB — COMPREHENSIVE METABOLIC PANEL
ALT: 10 U/L (ref 0–35)
AST: 14 U/L (ref 0–37)
Albumin: 4.3 g/dL (ref 3.5–5.2)
Alkaline Phosphatase: 75 U/L (ref 39–117)
BUN: 10 mg/dL (ref 6–23)
CO2: 25 mEq/L (ref 19–32)
Calcium: 9.7 mg/dL (ref 8.4–10.5)
Chloride: 99 mEq/L (ref 96–112)
Creatinine, Ser: 1 mg/dL (ref 0.40–1.20)
GFR: 58.47 mL/min — ABNORMAL LOW (ref >60.00)
Glucose, Bld: 189 mg/dL — ABNORMAL HIGH (ref 70–99)
Potassium: 3.7 mEq/L (ref 3.5–5.1)
Sodium: 135 mEq/L (ref 135–145)
Total Bilirubin: 0.6 mg/dL (ref 0.2–1.2)
Total Protein: 7.4 g/dL (ref 6.0–8.3)

## 2021-06-20 LAB — SEDIMENTATION RATE: Sed Rate: 20 mm/hr (ref 0–30)

## 2021-06-20 LAB — C-REACTIVE PROTEIN: CRP: <1 mg/dL (ref 0.5–20.0)

## 2021-06-20 NOTE — Progress Notes (Signed)
Subjective:    Patient ID: Karen Scott, female    DOB: 07-04-1955, 66 y.o.   MRN: [id number]  HPI Karen Scott is a 66 year old white female, established with Dr. Hilarie Scott who was last seen in August 2022 when she underwent colonoscopy.  She comes in today with persistent left-sided abdominal pain. Colonoscopy August 2022 with finding of 2 small polyps measuring 2 to 4 mm, both were tubular adenomas with no high-grade dysplasia, she was noted to have multiple diverticuli in the sigmoid and left colon and medium sized internal hemorrhoids. She says that she has had prior episodes of diverticulitis and has been treated by her PCP. Over the past 2 to 3 months she has been having episodes of left lower abdominal pain that has been lasting for several days at a time, will gradually resolve and then will recur.  She says she has been persistently tender throughout that time in the left lower quadrant and at times will have some generalized discomfort in her abdomen as well.  Her bowel movements generally alternate between loose stools and constipation.  She says usually she feels best if she does not eat and has been eating very light recently as heavy foods tend to make her discomfort worse.  She has noted a dark stool on occasion, no hematochezia.  No fever or chills. She has been treated with 2 recent courses of antibiotics per her PCP, most recently completed a course of Cipro and Flagyl and prior to that she says she took another course of antibiotics but does not recall what she was given.  She did not have any significant improvement in symptoms with the antibiotics.   Review of Systems Pertinent positive and negative review of systems were noted in the above HPI section.  All other review of systems was otherwise negative.   Outpatient Encounter Medications as of 06/20/2021  Medication Sig   aspirin 81 MG chewable tablet Chew 81 mg by mouth every morning.   atorvastatin (LIPITOR) 20 MG tablet Take 20  mg by mouth every evening.    diclofenac Sodium (VOLTAREN) 1 % GEL Apply 4 g topically 4 (four) times daily. To areas of chest and back pain.   dicyclomine (BENTYL) 20 MG tablet Take 20 mg by mouth daily as needed for spasms.   FLUoxetine (PROZAC) 20 MG capsule Take 3 capsules (60 mg total) by mouth daily. (Patient taking differently: Take 40 mg by mouth daily.)   HYDROcodone-acetaminophen (NORCO/VICODIN) 5-325 MG tablet Take 1 tablet by mouth every 4 (four) hours as needed.   ibuprofen (ADVIL) 200 MG tablet Take 2-3 tablets (400-600 mg total) by mouth every 6 (six) hours as needed for mild pain or moderate pain. Take with food.   losartan-hydrochlorothiazide (HYZAAR) 100-25 MG per tablet Take 1 tablet by mouth every morning.    metFORMIN (GLUCOPHAGE-XR) 500 MG 24 hr tablet Take 500 mg by mouth 2 (two) times daily.   Omega-3 Fatty Acids (FISH OIL PO) Take 1 capsule by mouth daily.   ondansetron (ZOFRAN-ODT) 4 MG disintegrating tablet Take 1 tablet by mouth as needed.   pantoprazole (PROTONIX) 40 MG tablet Take 40 mg by mouth daily.   rizatriptan (MAXALT-MLT) 10 MG disintegrating tablet Take 1 tablet (10 mg total) by mouth as needed for migraine. May repeat in 2 hours if needed (Patient taking differently: Take 10 mg by mouth daily as needed for migraine. May repeat in 2 hours if needed)   therapeutic multivitamin-minerals (THERAGRAN-M) tablet Take 1 tablet by  mouth daily.   [DISCONTINUED] Pantoprazole Sodium (PROTONIX PO) Take 1 tablet by mouth 2 (two) times daily.   No facility-administered encounter medications on file as of 06/20/2021.   Allergies  Allergen Reactions   Penicillins Anaphylaxis    All "myocins" : unknown Has patient had a PCN reaction causing immediate rash, facial/tongue/throat swelling, SOB or lightheadedness with hypotension: n/a Has patient had a PCN reaction causing severe rash involving mucus membranes or skin necrosis: n/a Has patient had a PCN reaction that required  hospitalization: n/a Has patient had a PCN reaction occurring within the last 10 years: n/a If all of the above answers are "NO", then may proceed with Cephalosporin use.    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   HTN (hypertension) 10/12/2013   Type 2 diabetes mellitus with hyperlipidemia ([place]) 10/12/2013   Social History   Socioeconomic History   Marital status: Widowed    Spouse name: Not on file   Number of children: 2   Years of education: Not on file   Highest education level: Some college, no degree  Occupational History   Occupation: [employer]  Tobacco Use   Smoking status: Never   Smokeless tobacco: Never  Vaping Use   Vaping Use: Never used  Substance and Sexual Activity   Alcohol use: No    Alcohol/week: 0.0 standard drinks    Comment: socially   Drug use: No   Sexual activity: Not on file  Other Topics Concern   Not on file  Social History Narrative   Lives at home with her son   Right handed   Caffeine: 2-3 cups in the mornings   Social Determinants of Health   Financial Resource Strain: Not on file  Food Insecurity: Not on file  Transportation Needs: Not on file  Physical Activity: Not on file  Stress: Not on file  Social Connections: Not on file  Intimate Partner Violence: Not on file    Karen Scott's family history includes Diabetes in her mother; Hearing loss in her maternal grandmother, paternal grandfather, and paternal grandmother; Hyperlipidemia in her mother; Hypertension in her mother; Lung cancer in her father; Pancreatic cancer in her mother; Stroke in her mother.      Objective:    Vitals:   06/20/21 0835  BP: 112/80  Pulse: (!) 104    Physical Exam Well-developed well-nourished older white female in no acute distress.  WCHENI,778  BMI 27.8  HEENT;  nontraumatic normocephalic, EOMI, PE R LA, sclera anicteric. Oropharynx; not examined today Neck; supple, no JVD Cardiovascular; regular rate and rhythm with S1-S2, no murmur rub or gallop Pulmonary; Clear bilaterally Abdomen; soft, there is tenderness in the left mid quadrant and left lower quadrant and mild tenderness is well in the right lower quadrant.   nondistended, no palpable mass or hepatosplenomegaly, bowel sounds are active Rectal; not done today Skin; benign exam, no jaundice rash or appreciable lesions Extremities; no clubbing cyanosis or edema skin warm and dry Neuro/Psych; alert and oriented x4, grossly nonfocal mood and affect appropriate        Assessment & Plan:   #52 66 year old white female with 2 to 2-monthhistory of left lower quadrant pain and persistent tenderness, chronic alternating bowel movements.  No response to recent course of Cipro and Flagyl for presumed  diverticulitis per PCP.  Etiology of her pain and tenderness is not clear at present.  Rule out persistent smoldering diverticulitis, consider segmental colitis associated with diverticulosis (SCID), rule out IBS, rule out other pelvic pathology/inflammatory process  Known sigmoid and left colon diverticulosis.  #2 history of adenomatous colon polyps-up-to-date with colonoscopy just done August 2022 3.  History of GERD 4.  Status postcholecystectomy 5.  Adult onset diabetes mellitus 6.  Hypertension 7.  History of migraines  Plan; CBC with differential, c-Met, sed rate, CRP Patient will be scheduled for CT scan of the abdomen and pelvis with contrast Start Bentyl 10 mg p.o. twice daily Continue Protonix 40 mg every morning Further recommendations pending results of CT.  Emelio Schneller Genia Harold PA-C 06/20/2021   Cc: Cari Caraway, MD

## 2021-06-20 NOTE — Patient Instructions (Addendum)
If you are age 67 or older, your body mass index should be between 23-30. Your Body mass index is 27.82 kg/m. If this is out of the aforementioned range listed, please consider follow up with your Primary Care Provider. ________________________________________________________  The [facility] providers would like to encourage you to use [facility] to communicate with providers for non-urgent requests or questions.  Due to long hold times on the telephone, sending your provider a message by [facility] may be a faster and more efficient way to get a response.  Please allow 48 business hours for a response.  Please remember that this is for non-urgent requests.  _______________________________________________________  Your provider has requested that you go to the basement level for lab work before leaving today. Press "B" on the elevator. The lab is located at the first door on the left as you exit the elevator.  You have been scheduled for a CT scan of the abdomen and pelvis at [facility], 1st floor Radiology. You are scheduled on 06/29/2021  at 8:30 am. You should arrive 15 minutes prior to your appointment time for registration.  Please pick up 2 bottles of contrast from [facility] at least 3 days prior to your scan. The solution may taste better if refrigerated, but do NOT add ice or any other liquid to this solution. Shake well before drinking.   Please follow the written instructions below on the day of your exam:   1) Do not eat anything after 4:30 am (4 hours prior to your test)   2) Drink 1 bottle of contrast @ 6:30 (2 hours prior to your exam)  Remember to shake well before drinking and do NOT pour over ice.     Drink 1 bottle of contrast @ 7:30 am (1 hour prior to your exam)   You may take any medications as prescribed with a small amount of water, if necessary. If you take any of the following medications: METFORMIN, GLUCOPHAGE, GLUCOVANCE, AVANDAMET, RIOMET, FORTAMET, Burt MET,  JANUMET, GLUMETZA or METAGLIP, you MAY be asked to HOLD this medication 48 hours AFTER the exam.   The purpose of you drinking the oral contrast is to aid in the visualization of your intestinal tract. The contrast solution may cause some diarrhea. Depending on your individual set of symptoms, you may also receive an intravenous injection of x-ray contrast/dye. Plan on being at [facility] for 45 minutes or longer, depending on the type of exam you are having performed.   If you have any questions regarding your exam or if you need to reschedule, you may call [facility] at [phone] between the hours of 8:00 am and 5:00 pm, Monday-Friday.   Use dicyclomine 10 mg 1 capsule 2-3 times daily Use Miralax 1 capful in 8 ounces of water or juice daily.  Follow up pending the results of your CT and labs.  Thank you for entrusting me with your care and choosing [facility].  Amy Esterwood, PA-C

## 2021-06-21 ENCOUNTER — Telehealth: Payer: Self-pay | Admitting: Physician Assistant

## 2021-06-21 ENCOUNTER — Telehealth: Payer: Self-pay

## 2021-06-21 NOTE — Telephone Encounter (Signed)
Left message for patient to please call back. 

## 2021-06-21 NOTE — Telephone Encounter (Signed)
-----   Message from Alfredia Ferguson, PA-C sent at 06/21/2021  1:16 PM EST ----- Please let the patient know that her labs from yesterday all look good, glucose was 189

## 2021-06-21 NOTE — Progress Notes (Signed)
Addendum: Reviewed and agree with assessment and management plan. Ericia Moxley M, MD  

## 2021-06-21 NOTE — Telephone Encounter (Signed)
Patient returned your call regarding lab results.  Please call her back.  Thank you.

## 2021-06-21 NOTE — Telephone Encounter (Signed)
See result note.  

## 2021-06-23 DIAGNOSIS — D1724 Benign lipomatous neoplasm of skin and subcutaneous tissue of left leg: Secondary | ICD-10-CM | POA: Diagnosis not present

## 2021-06-23 DIAGNOSIS — M25562 Pain in left knee: Secondary | ICD-10-CM | POA: Diagnosis not present

## 2021-06-23 DIAGNOSIS — M1712 Unilateral primary osteoarthritis, left knee: Secondary | ICD-10-CM | POA: Diagnosis not present

## 2021-06-29 ENCOUNTER — Ambulatory Visit (HOSPITAL_COMMUNITY)
Admission: RE | Admit: 2021-06-29 | Discharge: 2021-06-29 | Disposition: A | Discharge status: Home / Self Care | Payer: Medicare Other | Source: Ambulatory Visit | Attending: Physician Assistant | Admitting: Physician Assistant

## 2021-06-29 ENCOUNTER — Encounter (HOSPITAL_COMMUNITY): Payer: Self-pay

## 2021-06-29 ENCOUNTER — Other Ambulatory Visit: Payer: Self-pay

## 2021-06-29 DIAGNOSIS — K921 Melena: Secondary | ICD-10-CM | POA: Diagnosis not present

## 2021-06-29 DIAGNOSIS — R109 Unspecified abdominal pain: Secondary | ICD-10-CM | POA: Insufficient documentation

## 2021-06-29 DIAGNOSIS — R194 Change in bowel habit: Secondary | ICD-10-CM | POA: Insufficient documentation

## 2021-06-29 DIAGNOSIS — Z8719 Personal history of other diseases of the digestive system: Secondary | ICD-10-CM | POA: Insufficient documentation

## 2021-06-29 DIAGNOSIS — R1110 Vomiting, unspecified: Secondary | ICD-10-CM | POA: Diagnosis not present

## 2021-06-29 DIAGNOSIS — K760 Fatty (change of) liver, not elsewhere classified: Secondary | ICD-10-CM | POA: Diagnosis not present

## 2021-06-29 MED ORDER — SODIUM CHLORIDE (PF) 0.9 % IJ SOLN
INTRAMUSCULAR | Status: AC
  Filled 2021-06-29: qty 50

## 2021-06-29 MED ORDER — IOHEXOL 300 MG/ML  SOLN
100.0000 mL | Freq: Once | INTRAMUSCULAR | Status: AC | PRN
  Administered 2021-06-29: 100 mL via INTRAVENOUS

## 2021-07-04 ENCOUNTER — Telehealth: Payer: Self-pay | Admitting: Physician Assistant

## 2021-07-04 NOTE — Telephone Encounter (Signed)
See result note.  

## 2021-07-04 NOTE — Telephone Encounter (Signed)
Patient called requesting results from CT done on 2/1. Seeking advice, please advise.

## 2021-07-05 ENCOUNTER — Other Ambulatory Visit: Payer: Self-pay

## 2021-07-05 MED ORDER — MESALAMINE 1.2 G PO TBEC: 2.4000 g | DELAYED_RELEASE_TABLET | Freq: Every day | ORAL | 3 refills | Status: DC

## 2021-07-07 ENCOUNTER — Telehealth: Payer: Self-pay | Admitting: Physician Assistant

## 2021-07-07 MED ORDER — MESALAMINE ER 0.375 G PO CP24: 1500.0000 mg | ORAL_CAPSULE | Freq: Every day | ORAL | 2 refills | Status: AC

## 2021-07-07 NOTE — Telephone Encounter (Signed)
Called and spoke with patient's pharmacy. Apriso $441. It is covered by her insurance, but may be an issue with her insurance deductible. The pharmacy did let me know that they have a discount card for patient's without insurance would bring it down to $280. She would like to see if we can send something else in that may be cheaper. I have advised I would check to see what we could send, but it may still cost just as much due to her insurance. Please advise.

## 2021-07-07 NOTE — Telephone Encounter (Signed)
Could try another form of mesalamine with Apriso or Asacol

## 2021-07-07 NOTE — Telephone Encounter (Signed)
Inbound call from patient states Lialda is $428 and too expensive. Is requesting a cheaper alternative

## 2021-07-07 NOTE — Telephone Encounter (Signed)
Apriso has been sent to patient's pharmacy. Patient has been contacted to let her know. She understands that if the co-pay is not reasonable to contact the office.

## 2021-07-07 NOTE — Telephone Encounter (Signed)
Please advise of possible alternative

## 2021-07-07 NOTE — Telephone Encounter (Signed)
Patient called states the Kathie Dike is too expensive.

## 2021-07-08 NOTE — Telephone Encounter (Signed)
Left message for patient to return call.

## 2021-07-12 NOTE — Telephone Encounter (Signed)
Patient has been informed to use her Dicyclomine 3 times a day per Nicoletta Ba, PA-C

## 2021-07-26 ENCOUNTER — Ambulatory Visit: Payer: Medicare Other | Admitting: Internal Medicine

## 2021-08-12 ENCOUNTER — Encounter: Payer: Self-pay | Admitting: *Deleted

## 2021-08-22 DIAGNOSIS — E1159 Type 2 diabetes mellitus with other circulatory complications: Secondary | ICD-10-CM | POA: Diagnosis not present

## 2021-08-24 ENCOUNTER — Ambulatory Visit: Payer: Medicare Other | Admitting: Internal Medicine

## 2021-09-08 DIAGNOSIS — K589 Irritable bowel syndrome without diarrhea: Secondary | ICD-10-CM | POA: Diagnosis not present

## 2021-09-08 DIAGNOSIS — E782 Mixed hyperlipidemia: Secondary | ICD-10-CM | POA: Diagnosis not present

## 2021-09-08 DIAGNOSIS — M79643 Pain in unspecified hand: Secondary | ICD-10-CM | POA: Diagnosis not present

## 2021-09-08 DIAGNOSIS — G8929 Other chronic pain: Secondary | ICD-10-CM | POA: Diagnosis not present

## 2021-09-08 DIAGNOSIS — I152 Hypertension secondary to endocrine disorders: Secondary | ICD-10-CM | POA: Diagnosis not present

## 2021-09-08 DIAGNOSIS — G43409 Hemiplegic migraine, not intractable, without status migrainosus: Secondary | ICD-10-CM | POA: Diagnosis not present

## 2021-09-08 DIAGNOSIS — E1159 Type 2 diabetes mellitus with other circulatory complications: Secondary | ICD-10-CM | POA: Diagnosis not present

## 2021-09-08 DIAGNOSIS — M1712 Unilateral primary osteoarthritis, left knee: Secondary | ICD-10-CM | POA: Diagnosis not present

## 2021-09-09 DIAGNOSIS — M79601 Pain in right arm: Secondary | ICD-10-CM | POA: Diagnosis not present

## 2021-09-09 DIAGNOSIS — M79602 Pain in left arm: Secondary | ICD-10-CM | POA: Diagnosis not present

## 2021-09-12 DIAGNOSIS — K580 Irritable bowel syndrome with diarrhea: Secondary | ICD-10-CM | POA: Diagnosis not present

## 2021-09-12 DIAGNOSIS — K219 Gastro-esophageal reflux disease without esophagitis: Secondary | ICD-10-CM | POA: Diagnosis not present

## 2021-10-19 NOTE — H&P (Signed)
TOTAL KNEE ADMISSION H&P  Patient is being admitted for left total knee arthroplasty.  Subjective:  Chief Complaint: Left knee pain.  HPI: Karen Scott, 66 y.o. female has a history of pain and functional disability in the left knee due to arthritis and has failed non-surgical conservative treatments for greater than 12 weeks to include NSAID's and/or analgesics, corticosteriod injections, and activity modification. Onset of symptoms was gradual, starting several years ago with gradually worsening course since that time. The patient noted no past surgery on the left knee.  Patient currently rates pain in the left knee at 7 out of 10 with activity. Patient has night pain, worsening of pain with activity and weight bearing, and pain with passive range of motion. Patient has evidence of  bone-on-bone arthritis in the medial and patellofemoral compartments of the left knee  by imaging studies. There is no active infection.  Additionally, she has a subcutaneous lipoma that she requests to be removed during the surgery.  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   HTN (hypertension) 10/12/2013   Type 2 diabetes mellitus with hyperlipidemia ([place]) 10/12/2013    Past Medical History:  Diagnosis Date   Abdominal wall hernia    Adenomatous colon polyp    Anxiety    CAD (coronary artery disease)    Carpal tunnel syndrome    Clotting disorder ([place])    Diabetes mellitus without complication ([place])    Diverticulosis    DVT (deep venous thrombosis) (H[facility])    Esophageal stricture    GERD (gastroesophageal reflux disease)    Hepatic steatosis    Hyperlipidemia    Hypertension    Internal hemorrhoids    Migraines    Ovarian cyst    Shingles    Tubular adenoma of colon    Ventral hernia     Past Surgical History:  Procedure Laterality Date   ANKLE SURGERY     left ankle ligament repair   BREAST SURGERY     biopsy    CHOLECYSTECTOMY     JOINT REPLACEMENT  2008    Prior to Admission medications   Medication Sig Start Date End Date Taking? Authorizing Provider  aspirin 81 MG chewable tablet Chew 81 mg by mouth every morning.    [provider]  atorvastatin (LIPITOR) 20 MG tablet Take 20 mg by mouth every evening.     [provider]  diclofenac Sodium (VOLTAREN) 1 % GEL Apply 4 g topically 4 (four) times daily. To areas of chest and back pain. 10/17/20   Ezekiel Slocumb, DO  dicyclomine (BENTYL) 20 MG tablet Take 20 mg by mouth daily as needed for spasms. 02/20/20   [provider]  FLUoxetine (PROZAC) 20 MG capsule Take 3 capsules (60 mg total) by mouth daily. Patient taking differently: Take 40 mg by mouth daily. 05/31/20   Lomax, Amy, NP  HYDROcodone-acetaminophen (NORCO/VICODIN) 5-325 MG tablet Take 1 tablet by mouth every 4 (four) hours as needed. 04/02/21   Tacy Learn, PA-C  ibuprofen (ADVIL) 200 MG tablet Take 2-3 tablets (400-600 mg total) by mouth every 6 (six) hours as needed for mild pain or moderate pain. Take with food. 10/17/20   Ezekiel Slocumb, DO  losartan-hydrochlorothiazide (HYZAAR) 100-25 MG per tablet Take 1 tablet by mouth every morning.     [provider]  mesalamine (APRISO) 0.375 g 24 hr capsule Take 4 capsules (1.5 g total) by  mouth daily. 07/07/21   Esterwood, Amy S, PA-C  metFORMIN (GLUCOPHAGE-XR) 500 MG 24 hr tablet Take 500 mg by mouth 2 (two) times daily.    [provider]  Omega-3 Fatty Acids (FISH OIL PO) Take 1 capsule by mouth daily.    [provider]  ondansetron (ZOFRAN-ODT) 4 MG disintegrating tablet Take 1 tablet by mouth as needed. 06/01/21   [provider]  pantoprazole (PROTONIX) 40 MG tablet Take 40 mg by mouth daily. 04/27/21   [provider]  rizatriptan (MAXALT-MLT) 10 MG disintegrating tablet Take 1 tablet (10 mg total) by mouth as needed for migraine. May repeat in 2 hours if  needed Patient taking differently: Take 10 mg by mouth daily as needed for migraine. May repeat in 2 hours if needed 05/31/20   Lomax, Amy, NP  therapeutic multivitamin-minerals [facility]) tablet Take 1 tablet by mouth daily.    [provider]    Allergies  Allergen Reactions   Penicillins Anaphylaxis    All "myocins" : unknown Has patient had a PCN reaction causing immediate rash, facial/tongue/throat swelling, SOB or lightheadedness with hypotension: n/a Has patient had a PCN reaction causing severe rash involving mucus membranes or skin necrosis: n/a Has patient had a PCN reaction that required hospitalization: n/a Has patient had a PCN reaction occurring within the last 10 years: n/a If all of the above answers are "NO", then may proceed with Cephalosporin use.    Erythromycin Other (See Comments)    All in this class, was a child   Tramadol Itching    Social History   Socioeconomic History   Marital status: Widowed    Spouse name: Not on file   Number of children: 2   Years of education: Not on file   Highest education level: Some college, no degree  Occupational History   Occupation: [employer]  Tobacco Use   Smoking status: Never   Smokeless tobacco: Never  Vaping Use   Vaping Use: Never used  Substance and Sexual Activity   Alcohol use: No    Alcohol/week: 0.0 standard drinks    Comment: socially   Drug use: No   Sexual activity: Not on file  Other Topics Concern   Not on file  Social History Narrative   Lives at home with her son   Right handed   Caffeine: 2-3 cups in the mornings   Social Determinants of Health   Financial Resource Strain: Not on file  Food Insecurity: Not on file  Transportation Needs: Not on file  Physical Activity: Not on file  Stress: Not on file  Social Connections: Not on file  Intimate Partner Violence: Not on file    Tobacco Use: Low Risk    Smoking Tobacco Use: Never   Smokeless Tobacco Use:  Never   Passive Exposure: Not on file   Social History   Substance and Sexual Activity  Alcohol Use No   Alcohol/week: 0.0 standard drinks   Comment: socially    Family History  Problem Relation Age of Onset   Pancreatic cancer Mother    Diabetes Mother    Hyperlipidemia Mother    Hypertension Mother    Stroke Mother    Lung cancer Father    Hearing loss Maternal Grandmother    Hearing loss Paternal Grandmother    Hearing loss Paternal Grandfather    Colon cancer Neg Hx    Colon polyps Neg Hx    Esophageal cancer Neg Hx  Rectal cancer Neg Hx    Stomach cancer Neg Hx     Review of Systems  Constitutional:  Negative for chills and fever.  HENT: Negative.    Eyes: Negative.   Respiratory:  Negative for cough and shortness of breath.   Cardiovascular:  Negative for chest pain and palpitations.  Gastrointestinal:  Negative for abdominal pain, nausea and vomiting.  Genitourinary:  Negative for dysuria, frequency and urgency.  Musculoskeletal:  Positive for joint pain.  Skin:  Negative for rash.   Objective:  Physical Exam: Well nourished and well developed.  General: Alert and oriented x3, cooperative and pleasant, no acute distress.  Head: normocephalic, atraumatic, neck supple.  Eyes: EOMI.  Abdomen: non-tender to palpation and soft, normoactive bowel sounds. Musculoskeletal: The patient has an antalgic gait pattern favoring the left side without the use of assistive devices.  ? Bilateral Hip Exam:   The range of motion: normal without discomfort.  ? Right Knee Exam:   Well-healed scar from previous arthroplasty.   No effusion present. No swelling present.   The range of motion is: 0 to 120 degrees.   No crepitus on range of motion of the knee.   No medial joint line tenderness.   No lateral joint line tenderness.   The knee is stable.  ? Left Knee Exam:   3 x 5 cm soft tissue mass in the distal medial thigh just adjacent to the medial knee. It is mobile,  nontender, and non-pulsatile.   Trace effusion present.   The range of motion is: 5 to 120 degrees.   Marked crepitus on range of motion of the knee.   Positive medial joint line tenderness.   Slight lateral joint line tenderness.   The knee is stable. ? Calves soft and nontender. Motor function intact in LE. Strength 5/5 LE bilaterally. Neuro: Distal pulses 2+. Sensation to light touch intact in LE.  Vital signs in last 24 hours: BP: ()/()  Arterial Line BP: ()/()   Imaging Review Plain radiographs demonstrate severe degenerative joint disease of the left knee. The overall alignment is neutral. The bone quality appears to be adequate for age and reported activity level.  -AP of bilateral knees and lateral of the left knee dated 06/01/2021 demonstrate bone-on-bone arthritis in the medial and patellofemoral compartments of the left knee. The right knee AP shows her revision prosthesis in good position with no periprosthetic abnormalities.  -MRI of the left knee demonstrates a 3 x 5 cm subcutaneous lipoma. She does not have any aggressive characteristics associated with that lesion. It looks like a simple lipoma. She has severe degenerative changes in the medial and patellofemoral compartments of the left knee.   Assessment/Plan:  End stage arthritis, left knee   The patient history, physical examination, clinical judgment of the provider and imaging studies are consistent with end stage degenerative joint disease of the left knee and total knee arthroplasty is deemed medically necessary. The treatment options including medical management, injection therapy arthroscopy and arthroplasty were discussed at length. The risks and benefits of total knee arthroplasty were presented and reviewed. The risks due to aseptic loosening, infection, stiffness, patella tracking problems, thromboembolic complications and other imponderables were discussed. The patient acknowledged the explanation, agreed to  proceed with the plan and consent was signed. Patient is being admitted for inpatient treatment for surgery, pain control, PT, OT, prophylactic antibiotics, VTE prophylaxis, progressive ambulation and ADLs and discharge planning. The patient is planning to be discharged  home .  Patient's anticipated LOS is less than 2 midnights, meeting these requirements: - Lives within 1 hour of care - Has a competent adult at home to recover with post-op - NO history of  - Coronary Artery Disease  - Heart failure  - Heart attack  - Stroke  - Cardiac arrhythmia  - Respiratory Failure/COPD  - Renal failure  - Anemia  - Advanced Liver disease  Therapy Plans: EO Disposition: Home with Son Planned DVT Prophylaxis: Xarelto 10 mg (hx DVT) DME Needed: None PCP: Cari Caraway, MD (patient contacting for clearance) TXA: Topical Allergies: PCN (unknown), erythromycin (hives) Anesthesia Concerns: None BMI: 28.5 Last HgbA1c: unsure - will obtain with pre-op labs  Pharmacy: [facility])  Other: -Patient given clearance form to take to PCP -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 - Follow-up visit in 2 weeks with Dr. Wynelle Link - Begin physical therapy following surgery - Pre-operative lab work as pre-surgical testing - Prescriptions will be provided in hospital at time of discharge  R. Jaynie Bream, PA-C Orthopedic Surgery [facility] [place]

## 2021-10-21 NOTE — Progress Notes (Addendum)
COVID Vaccine Completed:  Date of COVID positive in last 90 days:  PCP - Cari Caraway, MD Cardiologist - Eleonore Chiquito, MD  Chest x-ray -  EKG -  Stress Test -  ECHO - 10-17-2020 EPIC Cardiac Cath - greater than 2 years Pacemaker/ICD device last checked: Spinal Cord Stimulator:  Bowel Prep -   Sleep Study -  CPAP -   Fasting Blood Sugar -  Checks Blood Sugar _____ times a day  Blood Thinner Instructions: Aspirin Instructions: Aspirin '81mg'$  Last Dose:  Activity level:  Can go up a flight of stairs and perform activities of daily living without stopping and without symptoms of chest pain or shortness of breath.   Able to exercise without symptoms  Unable to go up a flight of stairs without symptoms of      Anesthesia review: CAD, Clotting Disorder, CVA, DOE  Patient denies shortness of breath, fever, cough and chest pain at PAT appointment   Patient verbalized understanding of instructions that were given to them at the PAT appointment. Patient was also instructed that they will need to review over the PAT instructions again at home before surgery.

## 2021-10-21 NOTE — Progress Notes (Signed)
Left a message for Karen Scott at Dr. Anne Fu office regarding lipoma removal not being on consent form for surgery.

## 2021-10-21 NOTE — Patient Instructions (Signed)
DUE TO COVID-19 ONLY TWO VISITORS  (aged 68 and older)  IS ALLOWED TO COME WITH YOU AND STAY IN THE WAITING ROOM ONLY DURING PRE OP AND PROCEDURE.   **NO VISITORS ARE ALLOWED IN THE SHORT STAY AREA OR RECOVERY ROOM!!**  IF YOU WILL BE ADMITTED INTO THE HOSPITAL YOU ARE ALLOWED ONLY FOUR SUPPORT PEOPLE DURING VISITATION HOURS ONLY (7 AM -8PM)   The support person(s) must pass our screening, gel in and out Visitors GUEST BADGE MUST BE WORN VISIBLY  One adult visitor may remain with you overnight and MUST be in the room by 8 P.M.   You are not required to [employer] often Do NOT share personal items Notify your provider if you are in close contact with someone who has COVID or you develop fever 100.4 or greater, new onset of sneezing, cough, sore throat, shortness of breath or body aches.        Your procedure is scheduled on:  11-07-2021    Report to [facility] Main Entrance    Report to admitting at  8:45 AM   Call this number if you have problems the morning of surgery [phone]   Do not eat food :After Midnight.   After Midnight you may have the following liquids until 9:30 AM DAY OF SURGERY  Clear Liquid Diet Water Black Coffee (sugar ok, NO MILK/CREAM OR CREAMERS)  Tea (sugar ok, NO MILK/CREAM OR CREAMERS) regular and decaf                             Plain Jell-O (NO RED)                                           Fruit ices (not with fruit pulp, NO RED)                                     Popsicles (NO RED)                                                                  Juice: apple, WHITE grape, WHITE cranberry Sports drinks like Gatorade (NO RED) Clear broth(vegetable,chicken,beef)                   The day of surgery:  Drink ONE (1)  G2 at  9:30 AM the morning of surgery. Drink in one sitting. Do not sip.  This drink was given to you during your hospital  pre-op appointment visit. Nothing else to drink after completing the G2.           If you have questions, please contact your surgeon's office.   FOLLOW ANY ADDITIONAL PRE OP INSTRUCTIONS YOU RECEIVED FROM YOUR SURGEON'S OFFICE!!!     Oral Hygiene is also important to reduce your risk of infection.                                    Remember -  BRUSH YOUR TEETH THE MORNING OF SURGERY WITH YOUR REGULAR TOOTHPASTE   Do NOT smoke after Midnight   Take these medicines the morning of surgery with A SIP OF WATER: Prozac, Protonix, Hydrocodone, Zofran, and Maxalt if needed.   How to Manage Your Diabetes Before and After Surgery  Why is it important to control my blood sugar before and after surgery? Improving blood sugar levels before and after surgery helps healing and can limit problems. A way of improving blood sugar control is eating a healthy diet by:  Eating less sugar and carbohydrates  Increasing activity/exercise  Talking with your doctor about reaching your blood sugar goals High blood sugars (greater than 180 mg/dL) can raise your risk of infections and slow your recovery, so you will need to focus on controlling your diabetes during the weeks before surgery. Make sure that the doctor who takes care of your diabetes knows about your planned surgery including the date and location.  How do I manage my blood sugar before surgery? Check your blood sugar at least 4 times a day, starting 2 days before surgery, to make sure that the level is not too high or low. Check your blood sugar the morning of your surgery when you wake up and every 2 hours until you get to the Short Stay unit. If your blood sugar is less than 70 mg/dL, you will need to treat for low blood sugar: Do not take insulin. Treat a low blood sugar (less than 70 mg/dL) with  cup of clear juice (cranberry or apple), 4 glucose tablets, OR glucose gel. Recheck blood sugar in 15 minutes after treatment (to make sure it is greater than 70 mg/dL). If your blood sugar is not greater than 70 mg/dL on recheck,  call [phone] for further instructions. Report your blood sugar to the short stay nurse when you get to Short Stay.  If you are admitted to the hospital after surgery: Your blood sugar will be checked by the staff and you will probably be given insulin after surgery (instead of oral diabetes medicines) to make sure you have good blood sugar levels. The goal for blood sugar control after surgery is 80-180 mg/dL.   WHAT DO I DO ABOUT MY DIABETES MEDICATION?  Do not take oral diabetes medicines (pills) the morning of surgery.  THE DAY BEFORE SURGERY, take   METFORMIN as prescribed.        THE MORNING OF SURGERY, DO NOT TAKE METFORMIN   Reviewed and Endorsed by [facility] Patient Education Committee, August 2015                            You may not have any metal on your body including hair pins, jewelry, and body piercing             Do not wear make-up, lotions, powders, perfumes or deodorant  Do not wear nail polish including gel and S&S, artificial/acrylic nails, or any other type of covering on natural nails including finger and toenails. If you have artificial nails, gel coating, etc. that needs to be removed by a nail salon please have this removed prior to surgery or surgery may need to be canceled/ delayed if the surgeon/ anesthesia feels like they are unable to be safely monitored.   Do not shave  48 hours prior to surgery.               Do not bring valuables  to the hospital. [place].   Contacts, dentures or bridgework may not be worn into surgery.   Bring small overnight bag day of surgery.   Special Instructions: Bring a copy of your healthcare power of attorney and living will documents the day of surgery if you haven't scanned them before.  Please read over the following fact sheets you were given: IF YOU HAVE QUESTIONS ABOUT YOUR PRE-OP INSTRUCTIONS PLEASE CALL [facility] - Preparing for Surgery Before  surgery, you can play an important role.  Because skin is not sterile, your skin needs to be as free of germs as possible.  You can reduce the number of germs on your skin by washing with CHG (chlorahexidine gluconate) soap before surgery.  CHG is an antiseptic cleaner which kills germs and bonds with the skin to continue killing germs even after washing. Please DO NOT use if you have an allergy to CHG or antibacterial soaps.  If your skin becomes reddened/irritated stop using the CHG and inform your nurse when you arrive at Short Stay. Do not shave (including legs and underarms) for at least 48 hours prior to the first CHG shower.  You may shave your face/neck.  Please follow these instructions carefully:  1.  Shower with CHG Soap the night before surgery and the  morning of surgery.  2.  If you choose to wash your hair, wash your hair first as usual with your normal  shampoo.  3.  After you shampoo, rinse your hair and body thoroughly to remove the shampoo.                             4.  Use CHG as you would any other liquid soap.  You can apply chg directly to the skin and wash.  Gently with a scrungie or clean washcloth.  5.  Apply the CHG Soap to your body ONLY FROM THE NECK DOWN.   Do   not use on face/ open                           Wound or open sores. Avoid contact with eyes, ears mouth and   genitals (private parts).                       Wash face,  Genitals (private parts) with your normal soap.             6.  Wash thoroughly, paying special attention to the area where your    surgery  will be performed.  7.  Thoroughly rinse your body with warm water from the neck down.  8.  DO NOT shower/wash with your normal soap after using and rinsing off the CHG Soap.                9.  Pat yourself dry with a clean towel.            10.  Wear clean pajamas.            11.  Place clean sheets on your bed the night of your first shower and do not  sleep with pets. Day of Surgery : Do not apply  any lotions/deodorants the morning of surgery.  Please wear clean clothes to the hospital/surgery center.  FAILURE TO FOLLOW THESE INSTRUCTIONS MAY RESULT IN THE  CANCELLATION OF YOUR SURGERY  PATIENT SIGNATURE_________________________________  NURSE SIGNATURE__________________________________  ________________________________________________________________________     Karen Scott  An incentive spirometer is a tool that can help keep your lungs clear and active. This tool measures how well you are filling your lungs with each breath. Taking long deep breaths may help reverse or decrease the chance of developing breathing (pulmonary) problems (especially infection) following: A long period of time when you are unable to move or be active. BEFORE THE PROCEDURE  If the spirometer includes an indicator to show your best effort, your nurse or respiratory therapist will set it to a desired goal. If possible, sit up straight or lean slightly forward. Try not to slouch. Hold the incentive spirometer in an upright position. INSTRUCTIONS FOR USE  Sit on the edge of your bed if possible, or sit up as far as you can in bed or on a chair. Hold the incentive spirometer in an upright position. Breathe out normally. Place the mouthpiece in your mouth and seal your lips tightly around it. Breathe in slowly and as deeply as possible, raising the piston or the ball toward the top of the column. Hold your breath for 3-5 seconds or for as long as possible. Allow the piston or ball to fall to the bottom of the column. Remove the mouthpiece from your mouth and breathe out normally. Rest for a few seconds and repeat Steps 1 through 7 at least 10 times every 1-2 hours when you are awake. Take your time and take a few normal breaths between deep breaths. The spirometer may include an indicator to show your best effort. Use the indicator as a goal to work toward during each repetition. After each set of  10 deep breaths, practice coughing to be sure your lungs are clear. If you have an incision (the cut made at the time of surgery), support your incision when coughing by placing a pillow or rolled up towels firmly against it. Once you are able to get out of bed, walk around indoors and cough well. You may stop using the incentive spirometer when instructed by your caregiver.  RISKS AND COMPLICATIONS Take your time so you do not get dizzy or light-headed. If you are in pain, you may need to take or ask for pain medication before doing incentive spirometry. It is harder to take a deep breath if you are having pain. AFTER USE Rest and breathe slowly and easily. It can be helpful to keep track of a log of your progress. Your caregiver can provide you with a simple table to help with this. If you are using the spirometer at home, follow these instructions: [place] IF:  You are having difficultly using the spirometer. You have trouble using the spirometer as often as instructed. Your pain medication is not giving enough relief while using the spirometer. You develop fever of 100.5 F (38.1 C) or higher. SEEK IMMEDIATE MEDICAL CARE IF:  You cough up bloody sputum that had not been present before. You develop fever of 102 F (38.9 C) or greater. You develop worsening pain at or near the incision site. MAKE SURE YOU:  Understand these instructions. Will watch your condition. Will get help right away if you are not doing well or get worse. Document Released: 09/25/2006 Document Revised: 08/07/2011 Document Reviewed: 11/26/2006 [employer] Patient Information 2014 [employer], [place].   ________________________________________________________________________     WHAT IS A BLOOD TRANSFUSION? Blood Transfusion Information  A transfusion is the replacement of blood  or some of its parts. Blood is made up of multiple cells which provide different functions. Red blood cells carry oxygen and are  used for blood loss replacement. White blood cells fight against infection. Platelets control bleeding. Plasma helps clot blood. Other blood products are available for specialized needs, such as hemophilia or other clotting disorders. BEFORE THE TRANSFUSION  Who gives blood for transfusions?  Healthy volunteers who are fully evaluated to make sure their blood is safe. This is blood bank blood. Transfusion therapy is the safest it has ever been in the practice of medicine. Before blood is taken from a donor, a complete history is taken to make sure that person has no history of diseases nor engages in risky social behavior (examples are intravenous drug use or sexual activity with multiple partners). The donor's travel history is screened to minimize risk of transmitting infections, such as malaria. The donated blood is tested for signs of infectious diseases, such as HIV and hepatitis. The blood is then tested to be sure it is compatible with you in order to minimize the chance of a transfusion reaction. If you or a relative donates blood, this is often done in anticipation of surgery and is not appropriate for emergency situations. It takes many days to process the donated blood. RISKS AND COMPLICATIONS Although transfusion therapy is very safe and saves many lives, the main dangers of transfusion include:  Getting an infectious disease. Developing a transfusion reaction. This is an allergic reaction to something in the blood you were given. Every precaution is taken to prevent this. The decision to have a blood transfusion has been considered carefully by your caregiver before blood is given. Blood is not given unless the benefits outweigh the risks. AFTER THE TRANSFUSION Right after receiving a blood transfusion, you will usually feel much better and more energetic. This is especially true if your red blood cells have gotten low (anemic). The transfusion raises the level of the red blood cells  which carry oxygen, and this usually causes an energy increase. The nurse administering the transfusion will monitor you carefully for complications. HOME CARE INSTRUCTIONS  No special instructions are needed after a transfusion. You may find your energy is better. Speak with your caregiver about any limitations on activity for underlying diseases you may have. SEEK MEDICAL CARE IF:  Your condition is not improving after your transfusion. You develop redness or irritation at the intravenous (IV) site. SEEK IMMEDIATE MEDICAL CARE IF:  Any of the following symptoms occur over the next 12 hours: Shaking chills. You have a temperature by mouth above 102 F (38.9 C), not controlled by medicine. Chest, back, or muscle pain. People around you feel you are not acting correctly or are confused. Shortness of breath or difficulty breathing. Dizziness and fainting. You get a rash or develop hives. You have a decrease in urine output. Your urine turns a dark color or changes to pink, red, or brown. Any of the following symptoms occur over the next 10 days: You have a temperature by mouth above 102 F (38.9 C), not controlled by medicine. Shortness of breath. Weakness after normal activity. The white part of the eye turns yellow (jaundice). You have a decrease in the amount of urine or are urinating less often. Your urine turns a dark color or changes to pink, red, or brown. Document Released: 05/12/2000 Document Revised: 08/07/2011 Document Reviewed: 12/30/2007 [facility] Patient Information 2014 [place], [place].  _______________________________________________________________________

## 2021-10-26 ENCOUNTER — Other Ambulatory Visit: Payer: Self-pay

## 2021-10-26 ENCOUNTER — Encounter (HOSPITAL_COMMUNITY): Payer: Self-pay

## 2021-10-26 ENCOUNTER — Encounter (HOSPITAL_COMMUNITY)
Admission: RE | Admit: 2021-10-26 | Discharge: 2021-10-26 | Disposition: A | Discharge status: Home / Self Care | Payer: Medicare Other | Source: Ambulatory Visit | Attending: Orthopedic Surgery | Admitting: Orthopedic Surgery

## 2021-10-26 VITALS — BP 129/90 | HR 87 | Temp 98.2°F | Resp 18 | Ht 63.5 in | Wt 150.0 lb

## 2021-10-26 DIAGNOSIS — M1712 Unilateral primary osteoarthritis, left knee: Secondary | ICD-10-CM

## 2021-10-26 DIAGNOSIS — I2510 Atherosclerotic heart disease of native coronary artery without angina pectoris: Secondary | ICD-10-CM

## 2021-10-26 DIAGNOSIS — Z01818 Encounter for other preprocedural examination: Secondary | ICD-10-CM

## 2021-10-26 DIAGNOSIS — E119 Type 2 diabetes mellitus without complications: Secondary | ICD-10-CM | POA: Diagnosis not present

## 2021-10-26 HISTORY — DX: Depression, unspecified: F32.A

## 2021-10-26 HISTORY — DX: Pneumonia, unspecified organism: J18.9

## 2021-10-26 HISTORY — DX: Unspecified osteoarthritis, unspecified site: M19.90

## 2021-10-26 LAB — SURGICAL PCR SCREEN
MRSA, PCR: NEGATIVE
Staphylococcus aureus: NEGATIVE

## 2021-10-26 LAB — COMPREHENSIVE METABOLIC PANEL
ALT: 13 U/L (ref 0–44)
AST: 19 U/L (ref 15–41)
Albumin: 3.9 g/dL (ref 3.5–5.0)
Alkaline Phosphatase: 68 U/L (ref 38–126)
Anion gap: 9 (ref 5–15)
BUN: 18 mg/dL (ref 8–23)
CO2: 26 mmol/L (ref 22–32)
Calcium: 9.6 mg/dL (ref 8.9–10.3)
Chloride: 105 mmol/L (ref 98–111)
Creatinine, Ser: 1.08 mg/dL — ABNORMAL HIGH (ref 0.44–1.00)
GFR, Estimated: 56 mL/min — ABNORMAL LOW (ref >60)
Glucose, Bld: 189 mg/dL — ABNORMAL HIGH (ref 70–99)
Potassium: 3.9 mmol/L (ref 3.5–5.1)
Sodium: 140 mmol/L (ref 135–145)
Total Bilirubin: 0.8 mg/dL (ref 0.3–1.2)
Total Protein: 7.2 g/dL (ref 6.5–8.1)

## 2021-10-26 LAB — CBC
HCT: 39.7 % (ref 36.0–46.0)
Hemoglobin: 13 g/dL (ref 12.0–15.0)
MCH: 28.1 pg (ref 26.0–34.0)
MCHC: 32.7 g/dL (ref 30.0–36.0)
MCV: 85.7 fL (ref 80.0–100.0)
Platelets: 250 10*3/uL (ref 150–400)
RBC: 4.63 MIL/uL (ref 3.87–5.11)
RDW: 13.3 % (ref 11.5–15.5)
WBC: 6.7 10*3/uL (ref 4.0–10.5)
nRBC: 0 % (ref 0.0–0.2)

## 2021-10-26 LAB — TYPE AND SCREEN
ABO/RH(D): A POS
Antibody Screen: NEGATIVE

## 2021-10-26 LAB — HEMOGLOBIN A1C
Hgb A1c MFr Bld: 7.6 % — ABNORMAL HIGH (ref 4.8–5.6)
Mean Plasma Glucose: 171.42 mg/dL

## 2021-10-26 LAB — GLUCOSE, CAPILLARY: Glucose-Capillary: 189 mg/dL — ABNORMAL HIGH (ref 70–99)

## 2021-10-28 DIAGNOSIS — I2510 Atherosclerotic heart disease of native coronary artery without angina pectoris: Secondary | ICD-10-CM | POA: Diagnosis not present

## 2021-10-28 DIAGNOSIS — E1159 Type 2 diabetes mellitus with other circulatory complications: Secondary | ICD-10-CM | POA: Diagnosis not present

## 2021-10-28 DIAGNOSIS — I152 Hypertension secondary to endocrine disorders: Secondary | ICD-10-CM | POA: Diagnosis not present

## 2021-10-28 DIAGNOSIS — M1712 Unilateral primary osteoarthritis, left knee: Secondary | ICD-10-CM | POA: Diagnosis not present

## 2021-10-28 DIAGNOSIS — Z01818 Encounter for other preprocedural examination: Secondary | ICD-10-CM | POA: Diagnosis not present

## 2021-10-31 DIAGNOSIS — Z1389 Encounter for screening for other disorder: Secondary | ICD-10-CM | POA: Diagnosis not present

## 2021-10-31 DIAGNOSIS — Z23 Encounter for immunization: Secondary | ICD-10-CM | POA: Diagnosis not present

## 2021-10-31 DIAGNOSIS — Z0000 Encounter for general adult medical examination without abnormal findings: Secondary | ICD-10-CM | POA: Diagnosis not present

## 2021-11-06 MED ORDER — TRANEXAMIC ACID 1000 MG/10ML IV SOLN
2000.0000 mg | INTRAVENOUS | Status: AC
  Filled 2021-11-06: qty 20

## 2021-11-06 NOTE — Anesthesia Preprocedure Evaluation (Signed)
Anesthesia Evaluation  Patient identified by MRN, date of birth, ID band Patient awake    Reviewed: Allergy & Precautions, NPO status , Patient's Chart, lab work & pertinent test results  Airway Mallampati: II  TM Distance: >3 FB Neck ROM: Full    Dental no notable dental hx. (+) Teeth Intact, Dental Advisory Given   Pulmonary    Pulmonary exam normal breath sounds clear to auscultation       Cardiovascular hypertension, Normal cardiovascular exam Rhythm:Regular Rate:Normal  09/2020 Echo 1. Left ventricular ejection fraction, by estimation, is 60 to 65%. The  left ventricle has normal function. The left ventricle has no regional  wall motion abnormalities. Left ventricular diastolic parameters were  normal.  2. Right ventricular systolic function is normal. The right ventricular  size is normal. There is normal pulmonary artery systolic pressure.  3. The mitral valve is grossly normal. trivial to mild mitral valve  regurgitation.  4. The aortic valve is tricuspid. Aortic valve regurgitation is not  visualized.  5. The inferior vena cava is normal in size with greater than 50%  respiratory variability, suggesting right atrial pressure of 3 mmHg.    Neuro/Psych  Headaches, PSYCHIATRIC DISORDERS Anxiety    GI/Hepatic Neg liver ROS, GERD  Medicated,  Endo/Other  diabetes, Type 2  Renal/GU      Musculoskeletal  (+) Arthritis ,   Abdominal   Peds  Hematology Lab Results      Component                Value               Date                           HGB                      13.0                10/26/2021                HCT                      39.7                10/26/2021                    PLT                      250                 10/26/2021             Anesthesia Other Findings All: PCN, Tramadol, Erytthromycin  Reproductive/Obstetrics                           Anesthesia  Physical Anesthesia Plan  ASA: 3  Anesthesia Plan: Spinal and Regional   Post-op Pain Management: Regional block*, Dilaudid IV and Ofirmev IV (intra-op)*   Induction:   PONV Risk Score and Plan: 2 and Treatment may vary due to age or medical condition, Midazolam and Ondansetron  Airway Management Planned: Nasal Cannula and Natural Airway  Additional Equipment: None  Intra-op Plan:   Post-operative Plan:   Informed Consent: I have reviewed the patients History and Physical, chart, labs and discussed the procedure including the risks, benefits  and alternatives for the proposed anesthesia with the patient or authorized representative who has indicated his/her understanding and acceptance.     Dental advisory given  Plan Discussed with: CRNA and Anesthesiologist  Anesthesia Plan Comments: (Sp w L adductor canal)       Anesthesia Quick Evaluation

## 2021-11-07 ENCOUNTER — Other Ambulatory Visit: Payer: Self-pay

## 2021-11-07 ENCOUNTER — Ambulatory Visit (HOSPITAL_COMMUNITY): Payer: Medicare Other | Admitting: Physician Assistant

## 2021-11-07 ENCOUNTER — Encounter (HOSPITAL_COMMUNITY)
Admission: RE | Disposition: A | Discharge status: Home / Self Care | Payer: Self-pay | Source: Ambulatory Visit | Attending: Orthopedic Surgery

## 2021-11-07 ENCOUNTER — Encounter (HOSPITAL_COMMUNITY): Payer: Self-pay | Admitting: Orthopedic Surgery

## 2021-11-07 ENCOUNTER — Ambulatory Visit (HOSPITAL_BASED_OUTPATIENT_CLINIC_OR_DEPARTMENT_OTHER): Payer: Medicare Other | Admitting: Anesthesiology

## 2021-11-07 ENCOUNTER — Observation Stay (HOSPITAL_COMMUNITY)
Admission: RE | Admit: 2021-11-07 | Discharge: 2021-11-09 | Disposition: A | Discharge status: Home / Self Care | Payer: Medicare Other | Source: Ambulatory Visit | Attending: Orthopedic Surgery | Admitting: Orthopedic Surgery

## 2021-11-07 DIAGNOSIS — E119 Type 2 diabetes mellitus without complications: Secondary | ICD-10-CM | POA: Insufficient documentation

## 2021-11-07 DIAGNOSIS — I10 Essential (primary) hypertension: Secondary | ICD-10-CM | POA: Insufficient documentation

## 2021-11-07 DIAGNOSIS — M1712 Unilateral primary osteoarthritis, left knee: Principal | ICD-10-CM | POA: Insufficient documentation

## 2021-11-07 DIAGNOSIS — E785 Hyperlipidemia, unspecified: Secondary | ICD-10-CM | POA: Insufficient documentation

## 2021-11-07 DIAGNOSIS — G8918 Other acute postprocedural pain: Secondary | ICD-10-CM | POA: Diagnosis not present

## 2021-11-07 DIAGNOSIS — I2510 Atherosclerotic heart disease of native coronary artery without angina pectoris: Secondary | ICD-10-CM | POA: Diagnosis not present

## 2021-11-07 DIAGNOSIS — Z7982 Long term (current) use of aspirin: Secondary | ICD-10-CM | POA: Diagnosis not present

## 2021-11-07 DIAGNOSIS — M7989 Other specified soft tissue disorders: Secondary | ICD-10-CM

## 2021-11-07 DIAGNOSIS — D1724 Benign lipomatous neoplasm of skin and subcutaneous tissue of left leg: Secondary | ICD-10-CM | POA: Diagnosis not present

## 2021-11-07 DIAGNOSIS — M179 Osteoarthritis of knee, unspecified: Secondary | ICD-10-CM | POA: Diagnosis present

## 2021-11-07 DIAGNOSIS — Z7984 Long term (current) use of oral hypoglycemic drugs: Secondary | ICD-10-CM | POA: Diagnosis not present

## 2021-11-07 DIAGNOSIS — Z9660 Presence of unspecified orthopedic joint implant: Secondary | ICD-10-CM | POA: Insufficient documentation

## 2021-11-07 DIAGNOSIS — Z79899 Other long term (current) drug therapy: Secondary | ICD-10-CM | POA: Insufficient documentation

## 2021-11-07 HISTORY — PX: TOTAL KNEE ARTHROPLASTY: SHX125

## 2021-11-07 LAB — GLUCOSE, CAPILLARY
Glucose-Capillary: 207 mg/dL — ABNORMAL HIGH (ref 70–99)
Glucose-Capillary: 156 mg/dL — ABNORMAL HIGH (ref 70–99)
Glucose-Capillary: 172 mg/dL — ABNORMAL HIGH (ref 70–99)
Glucose-Capillary: 299 mg/dL — ABNORMAL HIGH (ref 70–99)
Glucose-Capillary: 227 mg/dL — ABNORMAL HIGH (ref 70–99)

## 2021-11-07 LAB — ABO/RH: ABO/RH(D): A POS

## 2021-11-07 SURGERY — ARTHROPLASTY, KNEE, TOTAL
Anesthesia: Regional | Site: Knee | Laterality: Left

## 2021-11-07 MED ORDER — ORAL CARE MOUTH RINSE: 15.0000 mL | Freq: Once | OROMUCOSAL | Status: AC

## 2021-11-07 MED ORDER — VANCOMYCIN HCL IN DEXTROSE 1-5 GM/200ML-% IV SOLN
1000.0000 mg | Freq: Two times a day (BID) | INTRAVENOUS | Status: AC
  Administered 2021-11-07: 1000 mg via INTRAVENOUS
  Filled 2021-11-07: qty 200

## 2021-11-07 MED ORDER — ATORVASTATIN CALCIUM 20 MG PO TABS
20.0000 mg | ORAL_TABLET | Freq: Every evening | ORAL | Status: DC
  Administered 2021-11-08 – 2021-11-09 (×2): 20 mg via ORAL
  Filled 2021-11-07 (×2): qty 1

## 2021-11-07 MED ORDER — LACTATED RINGERS IV SOLN: INTRAVENOUS | Status: DC

## 2021-11-07 MED ORDER — POVIDONE-IODINE 10 % EX SWAB
2.0000 "application " | Freq: Once | CUTANEOUS | Status: AC
  Administered 2021-11-07: 2 via TOPICAL

## 2021-11-07 MED ORDER — SODIUM CHLORIDE 0.9 % IR SOLN
Status: DC | PRN
  Administered 2021-11-07: 1000 mL

## 2021-11-07 MED ORDER — INSULIN ASPART 100 UNIT/ML IJ SOLN
0.0000 [IU] | Freq: Every day | INTRAMUSCULAR | Status: DC
  Administered 2021-11-07 – 2021-11-08 (×2): 2 [IU] via SUBCUTANEOUS

## 2021-11-07 MED ORDER — ACETAMINOPHEN 500 MG PO TABS
1000.0000 mg | ORAL_TABLET | Freq: Four times a day (QID) | ORAL | Status: AC
  Administered 2021-11-07 – 2021-11-08 (×4): 1000 mg via ORAL
  Filled 2021-11-07 (×4): qty 2

## 2021-11-07 MED ORDER — SODIUM CHLORIDE 0.9 % IV SOLN: INTRAVENOUS | Status: DC

## 2021-11-07 MED ORDER — HYDROCHLOROTHIAZIDE 25 MG PO TABS
25.0000 mg | ORAL_TABLET | Freq: Every day | ORAL | Status: DC
  Administered 2021-11-09: 25 mg via ORAL
  Filled 2021-11-07 (×2): qty 1

## 2021-11-07 MED ORDER — DEXAMETHASONE SODIUM PHOSPHATE 10 MG/ML IJ SOLN
8.0000 mg | Freq: Once | INTRAMUSCULAR | Status: AC
  Administered 2021-11-07: 10 mg via INTRAVENOUS

## 2021-11-07 MED ORDER — FLEET ENEMA 7-19 GM/118ML RE ENEM: 1.0000 | ENEMA | Freq: Once | RECTAL | Status: DC | PRN

## 2021-11-07 MED ORDER — VANCOMYCIN HCL IN DEXTROSE 1-5 GM/200ML-% IV SOLN
1000.0000 mg | INTRAVENOUS | Status: AC
  Administered 2021-11-07: 1000 mg via INTRAVENOUS
  Filled 2021-11-07: qty 200

## 2021-11-07 MED ORDER — MIDAZOLAM HCL 2 MG/2ML IJ SOLN
INTRAMUSCULAR | Status: AC
  Filled 2021-11-07: qty 2

## 2021-11-07 MED ORDER — ONDANSETRON HCL 4 MG PO TABS: 4.0000 mg | ORAL_TABLET | Freq: Four times a day (QID) | ORAL | Status: DC | PRN

## 2021-11-07 MED ORDER — POLYETHYLENE GLYCOL 3350 17 G PO PACK: 17.0000 g | PACK | Freq: Every day | ORAL | Status: DC | PRN

## 2021-11-07 MED ORDER — DEXAMETHASONE SODIUM PHOSPHATE 10 MG/ML IJ SOLN
10.0000 mg | Freq: Once | INTRAMUSCULAR | Status: AC
  Administered 2021-11-08: 10 mg via INTRAVENOUS
  Filled 2021-11-07: qty 1

## 2021-11-07 MED ORDER — AMISULPRIDE (ANTIEMETIC) 5 MG/2ML IV SOLN: 10.0000 mg | Freq: Once | INTRAVENOUS | Status: DC | PRN

## 2021-11-07 MED ORDER — ACETAMINOPHEN 10 MG/ML IV SOLN: 1000.0000 mg | Freq: Once | INTRAVENOUS | Status: DC | PRN

## 2021-11-07 MED ORDER — OXYCODONE HCL 5 MG/5ML PO SOLN: 5.0000 mg | Freq: Once | ORAL | Status: DC | PRN

## 2021-11-07 MED ORDER — HYDROMORPHONE HCL 1 MG/ML IJ SOLN: 0.2500 mg | INTRAMUSCULAR | Status: DC | PRN

## 2021-11-07 MED ORDER — CHLORHEXIDINE GLUCONATE 0.12 % MT SOLN
15.0000 mL | Freq: Once | OROMUCOSAL | Status: AC
  Administered 2021-11-07: 15 mL via OROMUCOSAL

## 2021-11-07 MED ORDER — DOCUSATE SODIUM 100 MG PO CAPS
100.0000 mg | ORAL_CAPSULE | Freq: Two times a day (BID) | ORAL | Status: DC
  Administered 2021-11-07 – 2021-11-09 (×4): 100 mg via ORAL
  Filled 2021-11-07 (×4): qty 1

## 2021-11-07 MED ORDER — GABAPENTIN 300 MG PO CAPS
300.0000 mg | ORAL_CAPSULE | Freq: Three times a day (TID) | ORAL | Status: DC
  Administered 2021-11-07 – 2021-11-09 (×7): 300 mg via ORAL
  Filled 2021-11-07 (×7): qty 1

## 2021-11-07 MED ORDER — ACETAMINOPHEN 10 MG/ML IV SOLN
1000.0000 mg | Freq: Four times a day (QID) | INTRAVENOUS | Status: DC
  Administered 2021-11-07: 1000 mg via INTRAVENOUS
  Filled 2021-11-07: qty 100

## 2021-11-07 MED ORDER — MORPHINE SULFATE (PF) 2 MG/ML IV SOLN
1.0000 mg | INTRAVENOUS | Status: DC | PRN
  Administered 2021-11-07 – 2021-11-09 (×6): 2 mg via INTRAVENOUS
  Filled 2021-11-07 (×6): qty 1

## 2021-11-07 MED ORDER — ONDANSETRON HCL 4 MG/2ML IJ SOLN
INTRAMUSCULAR | Status: DC | PRN
  Administered 2021-11-07: 4 mg via INTRAVENOUS

## 2021-11-07 MED ORDER — METOCLOPRAMIDE HCL 5 MG/ML IJ SOLN
5.0000 mg | Freq: Three times a day (TID) | INTRAMUSCULAR | Status: DC | PRN
  Administered 2021-11-09: 5 mg via INTRAVENOUS
  Filled 2021-11-07: qty 2

## 2021-11-07 MED ORDER — FENTANYL CITRATE (PF) 100 MCG/2ML IJ SOLN
INTRAMUSCULAR | Status: DC | PRN
Start: 2021-11-07 — End: 2021-11-07
  Administered 2021-11-07: 50 ug via INTRAVENOUS

## 2021-11-07 MED ORDER — FENTANYL CITRATE (PF) 100 MCG/2ML IJ SOLN
INTRAMUSCULAR | Status: AC
  Filled 2021-11-07: qty 2

## 2021-11-07 MED ORDER — TRANEXAMIC ACID 1000 MG/10ML IV SOLN
INTRAVENOUS | Status: DC | PRN
  Administered 2021-11-07: 2000 mg via TOPICAL

## 2021-11-07 MED ORDER — ONDANSETRON HCL 4 MG/2ML IJ SOLN: 4.0000 mg | Freq: Once | INTRAMUSCULAR | Status: DC | PRN

## 2021-11-07 MED ORDER — DICYCLOMINE HCL 20 MG PO TABS: 20.0000 mg | ORAL_TABLET | Freq: Every day | ORAL | Status: DC | PRN

## 2021-11-07 MED ORDER — RIZATRIPTAN BENZOATE 10 MG PO TBDP: 10.0000 mg | ORAL_TABLET | Freq: Every day | ORAL | Status: DC | PRN

## 2021-11-07 MED ORDER — SODIUM CHLORIDE (PF) 0.9 % IJ SOLN
INTRAMUSCULAR | Status: DC | PRN
  Administered 2021-11-07: 60 mL

## 2021-11-07 MED ORDER — BUPIVACAINE IN DEXTROSE 0.75-8.25 % IT SOLN
INTRATHECAL | Status: DC | PRN
  Administered 2021-11-07: 11.25 mg via INTRATHECAL

## 2021-11-07 MED ORDER — METOCLOPRAMIDE HCL 5 MG PO TABS: 5.0000 mg | ORAL_TABLET | Freq: Three times a day (TID) | ORAL | Status: DC | PRN

## 2021-11-07 MED ORDER — PHENYLEPHRINE 80 MCG/ML (10ML) SYRINGE FOR IV PUSH (FOR BLOOD PRESSURE SUPPORT)
PREFILLED_SYRINGE | INTRAVENOUS | Status: AC
  Filled 2021-11-07: qty 10

## 2021-11-07 MED ORDER — RIVAROXABAN 10 MG PO TABS
10.0000 mg | ORAL_TABLET | Freq: Every day | ORAL | Status: DC
  Administered 2021-11-08 – 2021-11-09 (×2): 10 mg via ORAL
  Filled 2021-11-07 (×3): qty 1

## 2021-11-07 MED ORDER — BUPIVACAINE LIPOSOME 1.3 % IJ SUSP: 20.0000 mL | Freq: Once | INTRAMUSCULAR | Status: DC

## 2021-11-07 MED ORDER — BUPIVACAINE LIPOSOME 1.3 % IJ SUSP
INTRAMUSCULAR | Status: AC
  Filled 2021-11-07: qty 20

## 2021-11-07 MED ORDER — ONDANSETRON HCL 4 MG/2ML IJ SOLN: 4.0000 mg | Freq: Four times a day (QID) | INTRAMUSCULAR | Status: DC | PRN

## 2021-11-07 MED ORDER — METHOCARBAMOL 500 MG IVPB - SIMPLE MED: 500.0000 mg | Freq: Four times a day (QID) | INTRAVENOUS | Status: DC | PRN

## 2021-11-07 MED ORDER — BISACODYL 10 MG RE SUPP: 10.0000 mg | Freq: Every day | RECTAL | Status: DC | PRN

## 2021-11-07 MED ORDER — ONDANSETRON HCL 4 MG/2ML IJ SOLN
INTRAMUSCULAR | Status: AC
  Filled 2021-11-07: qty 2

## 2021-11-07 MED ORDER — METHOCARBAMOL 500 MG PO TABS
500.0000 mg | ORAL_TABLET | Freq: Four times a day (QID) | ORAL | Status: DC | PRN
  Administered 2021-11-08: 500 mg via ORAL
  Filled 2021-11-07: qty 1

## 2021-11-07 MED ORDER — POLYVINYL ALCOHOL 1.4 % OP SOLN
1.0000 [drp] | OPHTHALMIC | Status: DC | PRN
Start: 2021-11-07 — End: 2021-11-09
  Administered 2021-11-07: 1 [drp] via OPHTHALMIC
  Filled 2021-11-07: qty 15

## 2021-11-07 MED ORDER — LOSARTAN POTASSIUM 50 MG PO TABS
100.0000 mg | ORAL_TABLET | Freq: Every day | ORAL | Status: DC
  Administered 2021-11-09: 100 mg via ORAL
  Filled 2021-11-07 (×2): qty 2

## 2021-11-07 MED ORDER — PROPOFOL 1000 MG/100ML IV EMUL
INTRAVENOUS | Status: AC
  Filled 2021-11-07: qty 100

## 2021-11-07 MED ORDER — ROPIVACAINE HCL 7.5 MG/ML IJ SOLN
INTRAMUSCULAR | Status: DC | PRN
  Administered 2021-11-07: 20 mL via PERINEURAL

## 2021-11-07 MED ORDER — MESALAMINE ER 0.375 G PO CP24: 1.5000 g | ORAL_CAPSULE | Freq: Every day | ORAL | Status: DC | PRN

## 2021-11-07 MED ORDER — DEXAMETHASONE SODIUM PHOSPHATE 10 MG/ML IJ SOLN
INTRAMUSCULAR | Status: AC
  Filled 2021-11-07: qty 1

## 2021-11-07 MED ORDER — PHENYLEPHRINE HCL (PRESSORS) 10 MG/ML IV SOLN
INTRAVENOUS | Status: DC | PRN
  Administered 2021-11-07 (×4): 80 ug via INTRAVENOUS

## 2021-11-07 MED ORDER — PHENOL 1.4 % MT LIQD: 1.0000 | OROMUCOSAL | Status: DC | PRN

## 2021-11-07 MED ORDER — CLONIDINE HCL (ANALGESIA) 100 MCG/ML EP SOLN
EPIDURAL | Status: DC | PRN
  Administered 2021-11-07: 100 ug

## 2021-11-07 MED ORDER — INSULIN ASPART 100 UNIT/ML IJ SOLN
0.0000 [IU] | Freq: Three times a day (TID) | INTRAMUSCULAR | Status: DC
  Administered 2021-11-07: 3 [IU] via SUBCUTANEOUS
  Administered 2021-11-07: 5 [IU] via SUBCUTANEOUS
  Administered 2021-11-08 (×2): 3 [IU] via SUBCUTANEOUS
  Administered 2021-11-08: 8 [IU] via SUBCUTANEOUS
  Administered 2021-11-09 (×3): 3 [IU] via SUBCUTANEOUS

## 2021-11-07 MED ORDER — PANTOPRAZOLE SODIUM 40 MG PO TBEC
40.0000 mg | DELAYED_RELEASE_TABLET | Freq: Two times a day (BID) | ORAL | Status: DC
  Administered 2021-11-08 – 2021-11-09 (×3): 40 mg via ORAL
  Filled 2021-11-07 (×3): qty 1

## 2021-11-07 MED ORDER — OXYCODONE HCL 5 MG PO TABS
10.0000 mg | ORAL_TABLET | ORAL | Status: DC | PRN
  Administered 2021-11-07 – 2021-11-09 (×7): 10 mg via ORAL
  Filled 2021-11-07 (×7): qty 2

## 2021-11-07 MED ORDER — SODIUM CHLORIDE (PF) 0.9 % IJ SOLN
INTRAMUSCULAR | Status: AC
  Filled 2021-11-07: qty 10

## 2021-11-07 MED ORDER — MIDAZOLAM HCL 5 MG/5ML IJ SOLN
INTRAMUSCULAR | Status: DC | PRN
  Administered 2021-11-07: 2 mg via INTRAVENOUS

## 2021-11-07 MED ORDER — PROPOFOL 500 MG/50ML IV EMUL
INTRAVENOUS | Status: DC | PRN
  Administered 2021-11-07: 75 ug/kg/min via INTRAVENOUS

## 2021-11-07 MED ORDER — FLUOXETINE HCL 20 MG PO CAPS
40.0000 mg | ORAL_CAPSULE | Freq: Every day | ORAL | Status: DC
  Administered 2021-11-08 – 2021-11-09 (×2): 40 mg via ORAL
  Filled 2021-11-07 (×2): qty 2

## 2021-11-07 MED ORDER — PROPOFOL 10 MG/ML IV BOLUS
INTRAVENOUS | Status: AC
  Filled 2021-11-07: qty 20

## 2021-11-07 MED ORDER — LOSARTAN POTASSIUM-HCTZ 100-25 MG PO TABS: 1.0000 | ORAL_TABLET | ORAL | Status: DC

## 2021-11-07 MED ORDER — SODIUM CHLORIDE (PF) 0.9 % IJ SOLN
INTRAMUSCULAR | Status: AC
  Filled 2021-11-07: qty 50

## 2021-11-07 MED ORDER — MENTHOL 3 MG MT LOZG: 1.0000 | LOZENGE | OROMUCOSAL | Status: DC | PRN

## 2021-11-07 MED ORDER — OXYCODONE HCL 5 MG PO TABS: 5.0000 mg | ORAL_TABLET | ORAL | Status: DC | PRN

## 2021-11-07 MED ORDER — DIPHENHYDRAMINE HCL 12.5 MG/5ML PO ELIX: 12.5000 mg | ORAL_SOLUTION | ORAL | Status: DC | PRN

## 2021-11-07 MED ORDER — BUPIVACAINE LIPOSOME 1.3 % IJ SUSP
INTRAMUSCULAR | Status: DC | PRN
  Administered 2021-11-07: 20 mL

## 2021-11-07 MED ORDER — STERILE WATER FOR IRRIGATION IR SOLN
Status: DC | PRN
  Administered 2021-11-07: 2000 mL

## 2021-11-07 MED ORDER — OXYCODONE HCL 5 MG PO TABS: 5.0000 mg | ORAL_TABLET | Freq: Once | ORAL | Status: DC | PRN

## 2021-11-07 SURGICAL SUPPLY — 59 items
ATTUNE PS FEM LT SZ 4 CEM KNEE (Femur) ×1 IMPLANT
ATTUNE PSRP INSR SZ4 10 KNEE (Insert) ×1 IMPLANT
BAG COUNTER SPONGE SURGICOUNT (BAG) ×1 IMPLANT
BAG SPEC THK2 15X12 ZIP CLS (MISCELLANEOUS) ×1
BAG SPNG CNTER NS LX DISP (BAG) ×1
BAG ZIPLOCK 12X15 (MISCELLANEOUS) ×2 IMPLANT
BASE TIBIAL ROT PLAT SZ 3 KNEE (Knees) IMPLANT
BLADE SAG 18X100X1.27 (BLADE) ×2 IMPLANT
BLADE SAW SGTL 11.0X1.19X90.0M (BLADE) ×2 IMPLANT
BNDG ELASTIC 6X5.8 VLCR STR LF (GAUZE/BANDAGES/DRESSINGS) ×2 IMPLANT
BOWL SMART MIX CTS (DISPOSABLE) ×2 IMPLANT
BSPLAT TIB 3 CMNT ROT PLAT STR (Knees) ×1 IMPLANT
CEMENT HV SMART SET (Cement) ×4 IMPLANT
COVER SURGICAL LIGHT HANDLE (MISCELLANEOUS) ×2 IMPLANT
CUFF TOURN SGL QUICK 34 (TOURNIQUET CUFF) ×2
CUFF TRNQT CYL 34X4.125X (TOURNIQUET CUFF) ×1 IMPLANT
DRAPE INCISE IOBAN 66X45 STRL (DRAPES) ×2 IMPLANT
DRAPE U-SHAPE 47X51 STRL (DRAPES) ×2 IMPLANT
DRSG AQUACEL AG ADV 3.5X 4 (GAUZE/BANDAGES/DRESSINGS) ×1 IMPLANT
DRSG AQUACEL AG ADV 3.5X10 (GAUZE/BANDAGES/DRESSINGS) ×2 IMPLANT
DURAPREP 26ML APPLICATOR (WOUND CARE) ×2 IMPLANT
ELECT REM PT RETURN 15FT ADLT (MISCELLANEOUS) ×2 IMPLANT
GLOVE BIO SURGEON STRL SZ 6.5 (GLOVE) ×1 IMPLANT
GLOVE BIO SURGEON STRL SZ7.5 (GLOVE) IMPLANT
GLOVE BIO SURGEON STRL SZ8 (GLOVE) ×2 IMPLANT
GLOVE BIOGEL PI IND STRL 6.5 (GLOVE) IMPLANT
GLOVE BIOGEL PI IND STRL 7.0 (GLOVE) IMPLANT
GLOVE BIOGEL PI IND STRL 8 (GLOVE) ×1 IMPLANT
GLOVE BIOGEL PI INDICATOR 6.5 (GLOVE) ×1
GLOVE BIOGEL PI INDICATOR 7.0 (GLOVE)
GLOVE BIOGEL PI INDICATOR 8 (GLOVE) ×1
GOWN STRL REUS W/ TWL LRG LVL3 (GOWN DISPOSABLE) ×1 IMPLANT
GOWN STRL REUS W/ TWL XL LVL3 (GOWN DISPOSABLE) IMPLANT
GOWN STRL REUS W/TWL LRG LVL3 (GOWN DISPOSABLE) ×2
GOWN STRL REUS W/TWL XL LVL3 (GOWN DISPOSABLE)
HANDPIECE INTERPULSE COAX TIP (DISPOSABLE) ×2
HOLDER FOLEY CATH W/STRAP (MISCELLANEOUS) ×1 IMPLANT
IMMOBILIZER KNEE 20 (SOFTGOODS) ×2
IMMOBILIZER KNEE 20 THIGH 36 (SOFTGOODS) ×1 IMPLANT
KIT TURNOVER KIT A (KITS) ×1 IMPLANT
MANIFOLD NEPTUNE II (INSTRUMENTS) ×2 IMPLANT
NS IRRIG 1000ML POUR BTL (IV SOLUTION) ×2 IMPLANT
PACK TOTAL KNEE CUSTOM (KITS) ×2 IMPLANT
PADDING CAST COTTON 6X4 STRL (CAST SUPPLIES) ×3 IMPLANT
PATELLA MEDIAL ATTUN 35MM KNEE (Knees) ×1 IMPLANT
PROTECTOR NERVE ULNAR (MISCELLANEOUS) ×2 IMPLANT
SET HNDPC FAN SPRY TIP SCT (DISPOSABLE) ×1 IMPLANT
SPIKE FLUID TRANSFER (MISCELLANEOUS) ×2 IMPLANT
STRIP CLOSURE SKIN 1/2X4 (GAUZE/BANDAGES/DRESSINGS) ×4 IMPLANT
SUT MNCRL AB 4-0 PS2 18 (SUTURE) ×2 IMPLANT
SUT STRATAFIX 0 PDS 27 VIOLET (SUTURE) ×2
SUT VIC AB 2-0 CT1 27 (SUTURE) ×6
SUT VIC AB 2-0 CT1 TAPERPNT 27 (SUTURE) ×3 IMPLANT
SUTURE STRATFX 0 PDS 27 VIOLET (SUTURE) ×1 IMPLANT
TIBIAL BASE ROT PLAT SZ 3 KNEE (Knees) ×2 IMPLANT
TRAY FOLEY MTR SLVR 16FR STAT (SET/KITS/TRAYS/PACK) ×2 IMPLANT
TUBE SUCTION HIGH CAP CLEAR NV (SUCTIONS) ×2 IMPLANT
WATER STERILE IRR 1000ML POUR (IV SOLUTION) ×4 IMPLANT
WRAP KNEE MAXI GEL POST OP (GAUZE/BANDAGES/DRESSINGS) ×2 IMPLANT

## 2021-11-07 NOTE — Discharge Instructions (Addendum)
 Frank Aluisio, MD Total Joint Specialist [facility] [address] [place], [place] [address] [phone]  TOTAL KNEE REPLACEMENT POSTOPERATIVE DIRECTIONS    Knee Rehabilitation, Guidelines Following Surgery  Results after knee surgery are often greatly improved when you follow the exercise, range of motion and muscle strengthening exercises prescribed by your doctor. Safety measures are also important to protect the knee from further injury. If any of these exercises cause you to have increased pain or swelling in your knee joint, decrease the amount until you are comfortable again and slowly increase them. If you have problems or questions, call your caregiver or physical therapist for advice.   BLOOD CLOT PREVENTION Take a 10 mg Xarelto once a day for three weeks following surgery. Then resume an 81 mg Aspirin once a day. You may resume your vitamins/supplements once you have discontinued the Xarelto. Do not take any NSAIDs (Advil, Aleve, Ibuprofen, Meloxicam, etc.) until you have discontinued the Xarelto.   HOME CARE INSTRUCTIONS  Remove items at home which could result in a fall. This includes throw rugs or furniture in walking pathways.  ICE to the affected knee as much as tolerated. Icing helps control swelling. If the swelling is well controlled you will be more comfortable and rehab easier. Continue to use ice on the knee for pain and swelling from surgery. You may notice swelling that will progress down to the foot and ankle. This is normal after surgery. Elevate the leg when you are not up walking on it.    Continue to use the breathing machine which will help keep your temperature down. It is common for your temperature to cycle up and down following surgery, especially at night when you are not up moving around and exerting yourself. The breathing machine keeps your lungs expanded and your temperature down. Do not place pillow under the operative  knee, focus on keeping the knee straight while resting  DIET You may resume your previous home diet once you are discharged from the hospital.  DRESSING / WOUND CARE / SHOWERING Keep your bulky bandage on for 2 days. On the third post-operative day you may remove the Ace bandage and gauze. There is a waterproof adhesive bandage on your skin which will stay in place until your first follow-up appointment. Once you remove this you will not need to place another bandage You may begin showering 3 days following surgery, but do not submerge the incision under water.  ACTIVITY For the first 5 days, the key is rest and control of pain and swelling Do your home exercises twice a day starting on post-operative day 3. On the days you go to physical therapy, just do the home exercises once that day. You should rest, ice and elevate the leg for 50 minutes out of every hour. Get up and walk/stretch for 10 minutes per hour. After 5 days you can increase your activity slowly as tolerated. Walk with your walker as instructed. Use the walker until you are comfortable transitioning to a cane. Walk with the cane in the opposite hand of the operative leg. You may discontinue the cane once you are comfortable and walking steadily. Avoid periods of inactivity such as sitting longer than an hour when not asleep. This helps prevent blood clots.  You may discontinue the knee immobilizer once you are able to perform a straight leg raise while lying down. You may resume a sexual relationship in one month or when given the OK by your   doctor.  You may return to work once you are cleared by your doctor.  Do not drive a car for 6 weeks or until released by your surgeon.  Do not drive while taking narcotics.  TED HOSE STOCKINGS Wear the elastic stockings on both legs for three weeks following surgery during the day. You may remove them at night for sleeping.  WEIGHT BEARING Weight bearing as tolerated with assist device  (walker, cane, etc) as directed, use it as long as suggested by your surgeon or therapist, typically at least 4-6 weeks.  POSTOPERATIVE CONSTIPATION PROTOCOL Constipation - defined medically as fewer than three stools per week and severe constipation as less than one stool per week.  One of the most common issues patients have following surgery is constipation.  Even if you have a regular bowel pattern at home, your normal regimen is likely to be disrupted due to multiple reasons following surgery.  Combination of anesthesia, postoperative narcotics, change in appetite and fluid intake all can affect your bowels.  In order to avoid complications following surgery, here are some recommendations in order to help you during your recovery period.  Colace (docusate) - Pick up an over-the-counter form of Colace or another stool softener and take twice a day as long as you are requiring postoperative pain medications.  Take with a full glass of water daily.  If you experience loose stools or diarrhea, hold the colace until you stool forms back up. If your symptoms do not get better within 1 week or if they get worse, check with your doctor. Dulcolax (bisacodyl) - Pick up over-the-counter and take as directed by the product packaging as needed to assist with the movement of your bowels.  Take with a full glass of water.  Use this product as needed if not relieved by Colace only.  MiraLax (polyethylene glycol) - Pick up over-the-counter to have on hand. MiraLax is a solution that will increase the amount of water in your bowels to assist with bowel movements.  Take as directed and can mix with a glass of water, juice, soda, coffee, or tea. Take if you go more than two days without a movement. Do not use MiraLax more than once per day. Call your doctor if you are still constipated or irregular after using this medication for 7 days in a row.  If you continue to have problems with postoperative constipation, please  contact the office for further assistance and recommendations.  If you experience "the worst abdominal pain ever" or develop nausea or vomiting, please contact the office immediatly for further recommendations for treatment.  ITCHING If you experience itching with your medications, try taking only a single pain pill, or even half a pain pill at a time.  You can also use Benadryl over the counter for itching or also to help with sleep.   MEDICATIONS See your medication summary on the "After Visit Summary" that the nursing staff will review with you prior to discharge.  You may have some home medications which will be placed on hold until you complete the course of blood thinner medication.  It is important for you to complete the blood thinner medication as prescribed by your surgeon.  Continue your approved medications as instructed at time of discharge.  PRECAUTIONS If you experience chest pain or shortness of breath - call 911 immediately for transfer to the hospital emergency department.  If you develop a fever greater that 101 F, purulent drainage from wound, increased redness or   drainage from wound, foul odor from the wound/dressing, or calf pain - CONTACT YOUR SURGEON.                                                   FOLLOW-UP APPOINTMENTS Make sure you keep all of your appointments after your operation with your surgeon and caregivers. You should call the office at the above phone number and make an appointment for approximately two weeks after the date of your surgery or on the date instructed by your surgeon outlined in the "After Visit Summary".  RANGE OF MOTION AND STRENGTHENING EXERCISES  Rehabilitation of the knee is important following a knee injury or an operation. After just a few days of immobilization, the muscles of the thigh which control the knee become weakened and shrink (atrophy). Knee exercises are designed to build up the tone and strength of the thigh muscles and to  improve knee motion. Often times heat used for twenty to thirty minutes before working out will loosen up your tissues and help with improving the range of motion but do not use heat for the first two weeks following surgery. These exercises can be done on a training (exercise) mat, on the floor, on a table or on a bed. Use what ever works the best and is most comfortable for you Knee exercises include:  Leg Lifts - While your knee is still immobilized in a splint or cast, you can do straight leg raises. Lift the leg to 60 degrees, hold for 3 sec, and slowly lower the leg. Repeat 10-20 times 2-3 times daily. Perform this exercise against resistance later as your knee gets better.  Quad and Hamstring Sets - Tighten up the muscle on the front of the thigh (Quad) and hold for 5-10 sec. Repeat this 10-20 times hourly. Hamstring sets are done by pushing the foot backward against an object and holding for 5-10 sec. Repeat as with quad sets.  Leg Slides: Lying on your back, slowly slide your foot toward your buttocks, bending your knee up off the floor (only go as far as is comfortable). Then slowly slide your foot back down until your leg is flat on the floor again. Angel Wings: Lying on your back spread your legs to the side as far apart as you can without causing discomfort.  A rehabilitation program following serious knee injuries can speed recovery and prevent re-injury in the future due to weakened muscles. Contact your doctor or a physical therapist for more information on knee rehabilitation.   POST-OPERATIVE OPIOID TAPER INSTRUCTIONS: It is important to wean off of your opioid medication as soon as possible. If you do not need pain medication after your surgery it is ok to stop day one. Opioids include: Codeine, Hydrocodone(Norco, Vicodin), Oxycodone(Percocet, oxycontin) and hydromorphone amongst others.  Long term and even short term use of opiods can cause: Increased pain  response Dependence Constipation Depression Respiratory depression And more.  Withdrawal symptoms can include Flu like symptoms Nausea, vomiting And more Techniques to manage these symptoms Hydrate well Eat regular healthy meals Stay active Use relaxation techniques(deep breathing, meditating, yoga) Do Not substitute Alcohol to help with tapering If you have been on opioids for less than two weeks and do not have pain than it is ok to stop all together.  Plan to wean off of opioids This plan  should start within one week post op of your joint replacement. Maintain the same interval or time between taking each dose and first decrease the dose.  Cut the total daily intake of opioids by one tablet each day Next start to increase the time between doses. The last dose that should be eliminated is the evening dose.   IF YOU ARE TRANSFERRED TO A SKILLED REHAB FACILITY If the patient is transferred to a skilled rehab facility following release from the hospital, a list of the current medications will be sent to the facility for the patient to continue.  When discharged from the skilled rehab facility, please have the facility set up the patient's Home Health Physical Therapy prior to being released. Also, the skilled facility will be responsible for providing the patient with their medications at time of release from the facility to include their pain medication, the muscle relaxants, and their blood thinner medication. If the patient is still at the rehab facility at time of the two week follow up appointment, the skilled rehab facility will also need to assist the patient in arranging follow up appointment in our office and any transportation needs.  MAKE SURE YOU:  Understand these instructions.  Get help right away if you are not doing well or get worse.   DENTAL ANTIBIOTICS:  In most cases prophylactic antibiotics for Dental procdeures after total joint surgery are not  necessary.  Exceptions are as follows:  1. History of prior total joint infection  2. Severely immunocompromised (Organ Transplant, cancer chemotherapy, Rheumatoid biologic medications such as Humera)  3. Poorly controlled diabetes (A1C &gt; 8.0, blood glucose over 200)  If you have one of these conditions, contact your surgeon for an antibiotic prescription, prior to your dental procedure.    Pick up stool softner and laxative for home use following surgery while on pain medications. Do not submerge incision under water. Please use good hand washing techniques while changing dressing each day. May shower starting three days after surgery. Please use a clean towel to pat the incision dry following showers. Continue to use ice for pain and swelling after surgery. Do not use any lotions or creams on the incision until instructed by your surgeon.   Information on my medicine - XARELTO (Rivaroxaban)   Why was Xarelto prescribed for you? Xarelto was prescribed for you to reduce the risk of blood clots forming after orthopedic surgery. The medical term for these abnormal blood clots is venous thromboembolism (VTE).  What do you need to know about xarelto ? Take your Xarelto ONCE DAILY at the same time every day. You may take it either with or without food.  If you have difficulty swallowing the tablet whole, you may crush it and mix in applesauce just prior to taking your dose.  Take Xarelto exactly as prescribed by your doctor and DO NOT stop taking Xarelto without talking to the doctor who prescribed the medication.  Stopping without other VTE prevention medication to take the place of Xarelto may increase your risk of developing a clot.  After discharge, you should have regular check-up appointments with your healthcare provider that is prescribing your Xarelto.    What do you do if you miss a dose? If you miss a dose, take it as soon as you remember on the same day then  continue your regularly scheduled once daily regimen the next day. Do not take two doses of Xarelto on the same day.   Important Safety Information A possible   side effect of Xarelto is bleeding. You should call your healthcare provider right away if you experience any of the following: Bleeding from an injury or your nose that does not stop. Unusual colored urine (red or dark brown) or unusual colored stools (red or black). Unusual bruising for unknown reasons. A serious fall or if you hit your head (even if there is no bleeding).  Some medicines may interact with Xarelto and might increase your risk of bleeding while on Xarelto. To help avoid this, consult your healthcare provider or pharmacist prior to using any new prescription or non-prescription medications, including herbals, vitamins, non-steroidal anti-inflammatory drugs (NSAIDs) and supplements.  This website has more information on Xarelto: www.xarelto.com.    

## 2021-11-07 NOTE — Interval H&P Note (Signed)
History and Physical Interval Note:  11/07/2021 6:28 AM  Karen Scott  has presented today for surgery, with the diagnosis of left knee osteoarthritis; left thigh lipoma.  The various methods of treatment have been discussed with the patient and family. After consideration of risks, benefits and other options for treatment, the patient has consented to  Procedure(s): Left total knee arthroplasty; removal lipoma left thigh (Left) as a surgical intervention.  The patient's history has been reviewed, patient examined, no change in status, stable for surgery.  I have reviewed the patient's chart and labs.  Questions were answered to the patient's satisfaction.     Pilar Plate Haleema Vanderheyden

## 2021-11-07 NOTE — Anesthesia Procedure Notes (Signed)
Spinal  Patient location during procedure: OR Start time: 11/07/2021 7:21 AM End time: 11/07/2021 7:26 AM Reason for block: surgical anesthesia Staffing Performed: anesthesiologist  Anesthesiologist: Barnet Glasgow, MD Performed by: Barnet Glasgow, MD Authorized by: Barnet Glasgow, MD   Preanesthetic Checklist Completed: patient identified, IV checked, site marked, risks and benefits discussed, surgical consent, monitors and equipment checked, pre-op evaluation and timeout performed Spinal Block Patient position: sitting Prep: DuraPrep and site prepped and draped Patient monitoring: heart rate, cardiac monitor, continuous pulse ox and blood pressure Approach: midline Location: L3-4 Injection technique: single-shot Needle Needle type: Sprotte  Needle gauge: 24 G Needle length: 9 cm Assessment Sensory level: T4 Events: CSF return Additional Notes  Attempt (s). Pt tolerated procedure well.

## 2021-11-07 NOTE — Op Note (Signed)
OPERATIVE REPORT-TOTAL KNEE ARTHROPLASTY   Pre-operative diagnosis- 1. Osteoarthritis  Left knee(s)   2. Soft tissue mass left thigh  Post-operative diagnosis- 1. Osteoarthritis Left knee(s)    2. Soft tissue mass left thigh  Procedure-  1. Left  Total Knee Arthroplasty   2. Excision soft tissue mass left thigh  Surgeon- Dione Plover. Taraji Mungo, MD  Assistant- Jaynie Bream, PA-C   Anesthesia-   Adductor canal block and spinal  EBL- 25 ml   Drains None  Tourniquet time-  Total Tourniquet Time Documented: Thigh (Left) - 35 minutes Total: Thigh (Left) - 35 minutes     Complications- None  Condition-PACU - hemodynamically stable.   Brief Clinical Note  AKAILA RAMBO is a 66 y.o. year old female with end stage OA of her left knee with progressively worsening pain and dysfunction. She has constant pain, with activity and at rest and significant functional deficits with difficulties even with ADLs. She has had extensive non-op management including analgesics, injections of cortisone and viscosupplements, and home exercise program, but remains in significant pain with significant dysfunction. Radiographs show bone on bone arthritis medial and patellofemoral. She presents now for left Total Knee Arthroplasty.  She also has a soft tissue mass left thigh consistent with a lipoma and presents for removal of the mass  Procedure in detail---   The patient is brought into the operating room and positioned supine on the operating table. After successful administration of  Adductor canal block and spinal,   a tourniquet is placed high on the  Left thigh(s) and the lower extremity is prepped and draped in the usual sterile fashion. Time out is performed by the operating team and then the  Left lower extremity is wrapped in Esmarch, knee flexed and the tourniquet inflated to 300 mmHg.       A midline incision is made with a ten blade through the subcutaneous tissue to the level of the extensor  mechanism. A fresh blade is used to make a medial parapatellar arthrotomy. Soft tissue over the proximal medial tibia is subperiosteally elevated to the joint line with a knife and into the semimembranosus bursa with a Cobb elevator. Soft tissue over the proximal lateral tibia is elevated with attention being paid to avoiding the patellar tendon on the tibial tubercle. The patella is everted, knee flexed 90 degrees and the ACL and PCL are removed. Findings are bone on bone medial and patellofemoral with large global osteophytes        The drill is used to create a starting hole in the distal femur and the canal is thoroughly irrigated with sterile saline to remove the fatty contents. The 5 degree Left  valgus alignment guide is placed into the femoral canal and the distal femoral cutting block is pinned to remove 9 mm off the distal femur. Resection is made with an oscillating saw.      The tibia is subluxed forward and the menisci are removed. The extramedullary alignment guide is placed referencing proximally at the medial aspect of the tibial tubercle and distally along the second metatarsal axis and tibial crest. The block is pinned to remove 32m off the more deficient medial  side. Resection is made with an oscillating saw. Size 3is the most appropriate size for the tibia and the proximal tibia is prepared with the modular drill and keel punch for that size.      The femoral sizing guide is placed and size 4 is most appropriate. Rotation is marked  off the epicondylar axis and confirmed by creating a rectangular flexion gap at 90 degrees. The size 4 cutting block is pinned in this rotation and the anterior, posterior and chamfer cuts are made with the oscillating saw. The intercondylar block is then placed and that cut is made.      Trial size 3 tibial component, trial size 4 posterior stabilized femur and a 10  mm posterior stabilized rotating platform insert trial is placed. Full extension is achieved with  excellent varus/valgus and anterior/posterior balance throughout full range of motion. The patella is everted and thickness measured to be 21  mm. Free hand resection is taken to 12 mm, a 35 template is placed, lug holes are drilled, trial patella is placed, and it tracks normally. Osteophytes are removed off the posterior femur with the trial in place. All trials are removed and the cut bone surfaces prepared with pulsatile lavage. Cement is mixed and once ready for implantation, the size 3 tibial implant, size  4 posterior stabilized femoral component, and the size 35 patella are cemented in place and the patella is held with the clamp. The trial insert is placed and the knee held in full extension. The Exparel (20 ml mixed with 60 ml saline) is injected into the extensor mechanism, posterior capsule, medial and lateral gutters and subcutaneous tissues.  All extruded cement is removed and once the cement is hard the permanent 10 mm posterior stabilized rotating platform insert is placed into the tibial tray.      The wound is copiously irrigated with saline solution and the extensor mechanism closed with # 0 Stratofix suture. I then made a 2 inch incision medially over the palpable soft tissue mass. It was a fatty mass consistent with a lipoma. The mass is sharply dissected from the surrounding tissue and is removed. It was approximately 2 x 3 cmm and all within the subcutaneous space.The tourniquet is then released for a total tourniquet time of 35  minutes. All minor bleeding is stopped with electrocautery. Flexion against gravity is 140 degrees and the patella tracks normally. Subcutaneous tissue is closed with 2.0 vicryl and subcuticular with running 4.0 Monocryl for both incisions. The incisions are cleaned and dried and steri-strips and bulky sterile dressings are applied. The limb is placed into a knee immobilizer and the patient is awakened and transported to recovery in stable condition.      Please  note that a surgical assistant was a medical necessity for this procedure in order to perform it in a safe and expeditious manner. Surgical assistant was necessary to retract the ligaments and vital neurovascular structures to prevent injury to them and also necessary for proper positioning of the limb to allow for anatomic placement of the prosthesis.   Dione Plover Takai Chiaramonte, MD    11/07/2021, 8:40 AM

## 2021-11-07 NOTE — Anesthesia Procedure Notes (Signed)
Anesthesia Regional Block: Adductor canal block   Pre-Anesthetic Checklist: , timeout performed,  Correct Patient, Correct Site, Correct Laterality,  Correct Procedure, Correct Position, site marked,  Risks and benefits discussed,  Surgical consent,  Pre-op evaluation,  At surgeon's request and post-op pain management  Laterality: Lower and Left  Prep: chloraprep       Needles:  Injection technique: Single-shot  Needle Type: Echogenic Needle     Needle Length: 9cm  Needle Gauge: 22     Additional Needles:   Procedures:,,,, ultrasound used (permanent image in chart),,    Narrative:  Start time: 11/07/2021 6:59 AM End time: 11/07/2021 7:05 AM Injection made incrementally with aspirations every 5 mL.  Performed by: Personally  Anesthesiologist: Barnet Glasgow, MD  Additional Notes: Block assessed prior to surgery. Pt tolerated procedure well.

## 2021-11-07 NOTE — Progress Notes (Signed)
Orthopedic Tech Progress Note Patient Details:  Karen Scott 10/08/1955 [id number]  CPM Left Knee CPM Left Knee: On Left Knee Flexion (Degrees): 40 Left Knee Extension (Degrees): 10  Post Interventions Patient Tolerated: Well Instructions Provided: Care of device, Adjustment of device  [name] 11/07/2021, 8:56 AM

## 2021-11-07 NOTE — Anesthesia Postprocedure Evaluation (Signed)
Anesthesia Post Note  Patient: Karen Scott  Procedure(s) Performed: Left total knee arthroplasty; removal lipoma left thigh (Left: Knee)     Patient location during evaluation: Nursing Unit Anesthesia Type: Regional Level of consciousness: oriented and awake and alert Pain management: pain level controlled Vital Signs Assessment: post-procedure vital signs reviewed and stable Respiratory status: spontaneous breathing and respiratory function stable Cardiovascular status: blood pressure returned to baseline and stable Postop Assessment: no headache, no backache, no apparent nausea or vomiting and patient able to bend at knees Anesthetic complications: no   No notable events documented.  Last Vitals:  Vitals:   11/07/21 0930 11/07/21 0945  BP: 111/81 117/81  Pulse: 72 74  Resp: 12 12  Temp:    SpO2: 96% 95%    Last Pain:  Vitals:   11/07/21 0930  TempSrc:   PainSc: 0-No pain                 Barnet Glasgow

## 2021-11-07 NOTE — Transfer of Care (Signed)
Immediate Anesthesia Transfer of Care Note  Patient: Karen Scott  Procedure(s) Performed: Left total knee arthroplasty; removal lipoma left thigh (Left: Knee)  Patient Location: PACU  Anesthesia Type:Spinal  Level of Consciousness: awake  Airway & Oxygen Therapy: Patient Spontanous Breathing  Post-op Assessment: Report given to RN  Post vital signs: stable  Last Vitals:  Vitals Value Taken Time  BP 94/66 11/07/21 0900  Temp 36.6 C 11/07/21 0900  Pulse 74 11/07/21 0911  Resp 9 11/07/21 0911  SpO2 97 % 11/07/21 0911  Vitals shown include unvalidated device data.  Last Pain:  Vitals:   11/07/21 0900  TempSrc:   PainSc: 0-No pain      Patients Stated Pain Goal: 3 (32/54/98 2641)  Complications: No notable events documented.

## 2021-11-07 NOTE — Evaluation (Signed)
Physical Therapy Evaluation Patient Details Name: Karen Scott MRN: [id number] DOB: 07/29/1955 Today's Date: 11/07/2021  History of Present Illness  Patient is 66 y/o female s/p Lt TKA and lipoma removal of Lt thigh on 11/07/21. PMH significant for Arthritis,DVT, CAD, Carpal Tunnel syndrome, abdominal wall hernia, adenomatous colon polyp, clotting disorder, depression, DM, diverticulosis, esophageal stricture, GERD, hepatic steatosis, hyperlipidemia, HTN, pneumonia, ventral hernia  Clinical Impression  Pt presents with mobility and balance deficits due to above HPI. 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 She lives in a [place] with her son who can assist her the day of DC. Currently pt requires supervision for bed mobility, and Min assist for transfers and gait. Pt will benefit from skilled therapy and following MD's post surgical instructions. Recommend Outpatient PT. Pt will progress as able.      Recommendations for follow up therapy are one component of a multi-disciplinary discharge planning process, led by the attending physician.  Recommendations may be updated based on patient status, additional functional criteria and insurance authorization.  Follow Up Recommendations Follow physician's recommendations for discharge plan and follow up therapies    Assistance Recommended at Discharge Intermittent Supervision/Assistance  Patient can return home with the following  A little help with walking and/or transfers;A little help with bathing/dressing/bathroom;Assistance with cooking/housework;Assist for transportation;Help with stairs or ramp for entrance    Equipment Recommendations None recommended by PT  Recommendations for Other Services       Functional Status Assessment Patient has had a recent decline in their functional status and demonstrates the ability to make significant improvements in function in a reasonable and predictable amount of  time.     Precautions / Restrictions Precautions Precautions: Fall Restrictions Weight Bearing Restrictions: No Other Position/Activity Restrictions: WBAT      Mobility  Bed Mobility Overal bed mobility: Needs Assistance Bed Mobility: Supine to Sit     Supine to sit: HOB elevated, Supervision     General bed mobility comments: Pt able to sit up from supine using Bil UE to get to EOB.    Transfers Overall transfer level: Needs assistance Equipment used: Rolling walker (2 wheels) Transfers: Sit to/from Stand Sit to Stand: Min assist           General transfer comment: Pt relied on RW and  VC's "keep operative leg straight out while using other leg to stand up".    Ambulation/Gait Ambulation/Gait assistance: Min assist Gait Distance (Feet): 50 Feet Assistive device: Rolling walker (2 wheels) Gait Pattern/deviations: Step-to pattern, Antalgic, Decreased stride length Gait velocity: decr     General Gait Details: Pt given repeated verbal instructions to complete a 'step-to', not a 'step-through' during ambulating. Pt relied on RW for support, no buckling of Lt knee noted.  Stairs            Wheelchair Mobility    Modified Rankin (Stroke Patients Only)       Balance Overall balance assessment: Needs assistance Sitting-balance support: Feet supported Sitting balance-Leahy Scale: Fair     Standing balance support: Bilateral upper extremity supported, During functional activity, Reliant on assistive device for balance Standing balance-Leahy Scale: Fair                               Pertinent Vitals/Pain Pain Assessment Pain Assessment: 0-10 Pain Score: 6  Pain Location: Lt knee Pain Descriptors / Indicators: Discomfort, Aching Pain Intervention(s):  Monitored during session    Home Living Family/patient expects to be discharged to:: Private residence Living Arrangements: Children Available Help at Discharge: Family Type of Home:  House (Townhouse) Home Access: Stairs to enter Entrance Stairs-Rails: Can reach both Entrance Stairs-Number of Steps: 3   Home Layout: Two level;Full bath on main level;Able to live on main level with bedroom/bathroom Home Equipment: Shower seat;Cane - single point;Grab bars - toilet;Grab bars - tub/shower      Prior Function Prior Level of Function : Independent/Modified Independent;Driving             Mobility Comments: 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    Cervical / Trunk Assessment Cervical / Trunk Assessment: Normal  Communication   Communication: No difficulties  Cognition Arousal/Alertness: Awake/alert Behavior During Therapy: WFL for tasks assessed/performed Overall Cognitive Status: Within Functional Limits for tasks assessed                                          General Comments      Exercises Total Joint Exercises Ankle Circles/Pumps: AROM, 20 reps, Seated, Both Quad Sets: AROM, 5 reps, Seated, Left Heel Slides: AROM, 10 reps, Left, Seated   Assessment/Plan    PT Assessment Patient needs continued PT services  PT Problem List Decreased mobility;Decreased balance;Decreased range of motion;Decreased activity tolerance       PT Treatment Interventions Gait training;Stair training;Functional mobility training;Therapeutic activities;Therapeutic exercise;Balance training;Patient/family education    PT Goals (Current goals can be found in the Care Plan section)  Acute Rehab PT Goals Patient Stated Goal: Pt wants to go home and return back to PLOF. PT Goal Formulation: With patient/family Time  For Goal Achievement: 11/21/21 Potential to Achieve Goals: Good    Frequency 7X/week     Co-evaluation               AM-PAC PT "6 Clicks" Mobility  Outcome Measure Help needed turning from your back to your side while in a flat bed without using bedrails?: None Help needed moving from lying on your back to sitting on the side of a flat bed without using bedrails?: None Help needed moving to and from a bed to a chair (including a wheelchair)?: A Little Help needed standing up from a chair using your arms (e.g., wheelchair or bedside chair)?: A Little Help needed to walk in hospital room?: A Little Help needed climbing 3-5 steps with a railing? : A Little 6 Click Score: 20    End of Session Equipment Utilized During Treatment: Gait belt Activity Tolerance: Patient tolerated treatment well Patient left: in chair;with call bell/phone within reach;with chair alarm set;with family/visitor present Nurse Communication: Mobility status PT Visit Diagnosis: Unsteadiness on feet (R26.81);Other abnormalities of gait and mobility (R26.89);Pain Pain - Right/Left: Left Pain - part of body: Knee    Time: 1401-1430 PT Time Calculation (min) (ACUTE ONLY): 29 min   Charges:   PT Evaluation $PT Eval Low Complexity: 1 Low PT Treatments $Gait Training: 8-22 mins  [name], SPT Acute Rehab [facility] 11/07/2021, 4:30 PM

## 2021-11-07 NOTE — Plan of Care (Signed)
?  Problem: Activity: ?Goal: Risk for activity intolerance will decrease ?Outcome: Progressing ?  ?Problem: Safety: ?Goal: Ability to remain free from injury will improve ?Outcome: Progressing ?  ?Problem: Pain Managment: ?Goal: General experience of comfort will improve ?Outcome: Progressing ?  ?

## 2021-11-08 ENCOUNTER — Encounter (HOSPITAL_COMMUNITY): Payer: Self-pay | Admitting: Orthopedic Surgery

## 2021-11-08 DIAGNOSIS — I2510 Atherosclerotic heart disease of native coronary artery without angina pectoris: Secondary | ICD-10-CM | POA: Diagnosis not present

## 2021-11-08 DIAGNOSIS — Z7982 Long term (current) use of aspirin: Secondary | ICD-10-CM | POA: Diagnosis not present

## 2021-11-08 DIAGNOSIS — I10 Essential (primary) hypertension: Secondary | ICD-10-CM | POA: Diagnosis not present

## 2021-11-08 DIAGNOSIS — M1712 Unilateral primary osteoarthritis, left knee: Secondary | ICD-10-CM | POA: Diagnosis not present

## 2021-11-08 DIAGNOSIS — E785 Hyperlipidemia, unspecified: Secondary | ICD-10-CM | POA: Diagnosis not present

## 2021-11-08 DIAGNOSIS — Z9660 Presence of unspecified orthopedic joint implant: Secondary | ICD-10-CM | POA: Diagnosis not present

## 2021-11-08 LAB — BASIC METABOLIC PANEL
Anion gap: 7 (ref 5–15)
BUN: 18 mg/dL (ref 8–23)
CO2: 27 mmol/L (ref 22–32)
Calcium: 8.5 mg/dL — ABNORMAL LOW (ref 8.9–10.3)
Chloride: 104 mmol/L (ref 98–111)
Creatinine, Ser: 1.11 mg/dL — ABNORMAL HIGH (ref 0.44–1.00)
GFR, Estimated: 54 mL/min — ABNORMAL LOW (ref >60)
Glucose, Bld: 190 mg/dL — ABNORMAL HIGH (ref 70–99)
Potassium: 3.5 mmol/L (ref 3.5–5.1)
Sodium: 138 mmol/L (ref 135–145)

## 2021-11-08 LAB — GLUCOSE, CAPILLARY
Glucose-Capillary: 167 mg/dL — ABNORMAL HIGH (ref 70–99)
Glucose-Capillary: 189 mg/dL — ABNORMAL HIGH (ref 70–99)
Glucose-Capillary: 274 mg/dL — ABNORMAL HIGH (ref 70–99)
Glucose-Capillary: 247 mg/dL — ABNORMAL HIGH (ref 70–99)

## 2021-11-08 LAB — CBC
HCT: 31.3 % — ABNORMAL LOW (ref 36.0–46.0)
Hemoglobin: 10.4 g/dL — ABNORMAL LOW (ref 12.0–15.0)
MCH: 28.4 pg (ref 26.0–34.0)
MCHC: 33.2 g/dL (ref 30.0–36.0)
MCV: 85.5 fL (ref 80.0–100.0)
Platelets: 215 10*3/uL (ref 150–400)
RBC: 3.66 MIL/uL — ABNORMAL LOW (ref 3.87–5.11)
RDW: 12.9 % (ref 11.5–15.5)
WBC: 15.1 10*3/uL — ABNORMAL HIGH (ref 4.0–10.5)
nRBC: 0 % (ref 0.0–0.2)

## 2021-11-08 LAB — SURGICAL PATHOLOGY

## 2021-11-08 MED ORDER — GABAPENTIN 300 MG PO CAPS: ORAL_CAPSULE | ORAL | 0 refills | Status: AC

## 2021-11-08 MED ORDER — CYCLOBENZAPRINE HCL 10 MG PO TABS
10.0000 mg | ORAL_TABLET | Freq: Three times a day (TID) | ORAL | 0 refills | Status: DC | PRN
Start: 2021-11-08 — End: 2021-11-08

## 2021-11-08 MED ORDER — METHOCARBAMOL 500 MG PO TABS: 500.0000 mg | ORAL_TABLET | Freq: Four times a day (QID) | ORAL | 0 refills | Status: DC | PRN

## 2021-11-08 MED ORDER — OXYCODONE HCL 5 MG PO TABS: 5.0000 mg | ORAL_TABLET | Freq: Four times a day (QID) | ORAL | 0 refills | Status: DC | PRN

## 2021-11-08 MED ORDER — RIVAROXABAN 10 MG PO TABS: 10.0000 mg | ORAL_TABLET | Freq: Every day | ORAL | 0 refills | Status: AC

## 2021-11-08 MED ORDER — CYCLOBENZAPRINE HCL 10 MG PO TABS
10.0000 mg | ORAL_TABLET | Freq: Three times a day (TID) | ORAL | Status: DC | PRN
  Administered 2021-11-08 – 2021-11-09 (×3): 10 mg via ORAL
  Filled 2021-11-08 (×3): qty 1

## 2021-11-08 NOTE — Progress Notes (Signed)
Subjective: 1 Day Post-Op Procedure(s) (LRB): Left total knee arthroplasty; removal lipoma left thigh (Left) Patient reports pain as moderate.   Patient seen in rounds by Dr. Wynelle Link. Patient with increased pain last night/this AM. Block most likely wore off early. No issues overnight. Denies chest pain, SOB, or calf pain. Foley catheter removed this AM.  We will continue therapy today, ambulated 107' yesterday.   Objective: Vital signs in last 24 hours: Temp:  [97.3 F (36.3 C)-98.1 F (36.7 C)] 97.6 F (36.4 C) (06/13 0649) Pulse Rate:  [71-81] 74 (06/13 0649) Resp:  [12-20] 18 (06/13 0649) BP: (94-135)/(66-94) 111/74 (06/13 0649) SpO2:  [95 %-98 %] 97 % (06/13 0649)  Intake/Output from previous day:  Intake/Output Summary (Last 24 hours) at 11/08/2021 0722 Last data filed at 11/08/2021 0653 Gross per 24 hour  Intake 2600 ml  Output 1725 ml  Net 875 ml     Intake/Output this shift: No intake/output data recorded.  Labs: Recent Labs    11/08/21 0318  HGB 10.4*   Recent Labs    11/08/21 0318  WBC 15.1*  RBC 3.66*  HCT 31.3*  PLT 215   Recent Labs    11/08/21 0318  NA 138  K 3.5  CL 104  CO2 27  BUN 18  CREATININE 1.11*  GLUCOSE 190*  CALCIUM 8.5*   No results for input(s): "LABPT", "INR" in the last 72 hours.  Exam: General - Patient is Alert and Oriented Extremity - Neurologically intact Neurovascular intact Sensation intact distally Dorsiflexion/Plantar flexion intact Dressing - dressing C/D/I Motor Function - intact, moving foot and toes well on exam.   Past Medical History:  Diagnosis Date   Abdominal wall hernia    Adenomatous colon polyp    Anxiety    Arthritis    CAD (coronary artery disease)    Carpal tunnel syndrome    Clotting disorder (HCC)    Depression    Diabetes mellitus without complication (HCC)    Diverticulosis    DVT (deep venous thrombosis) (HCC)    DVT (deep venous thrombosis) (HCC)    L leg   Esophageal  stricture    GERD (gastroesophageal reflux disease)    Hepatic steatosis    Hyperlipidemia    Hypertension    Internal hemorrhoids    Migraines    Ovarian cyst    Pneumonia    Shingles    Tubular adenoma of colon    Ventral hernia     Assessment/Plan: 1 Day Post-Op Procedure(s) (LRB): Left total knee arthroplasty; removal lipoma left thigh (Left) Principal Problem:   OA (osteoarthritis) of knee Active Problems:   Osteoarthritis of left knee  Estimated body mass index is 26.15 kg/m as calculated from the following:   Height as of this encounter: 5' 3.5" (1.613 m).   Weight as of this encounter: 68 kg. Advance diet Up with therapy D/C IV fluids   Patient's anticipated LOS is less than 2 midnights, meeting these requirements: - Younger than 78 - Lives within 1 hour of care - Has a competent adult at home to recover with post-op recover - NO history of  - Coronary Artery Disease  - Heart failure  - Heart attack  - Stroke  - Cardiac arrhythmia  - Respiratory Failure/COPD  - Renal failure  - Anemia  - Advanced Liver disease  DVT Prophylaxis - Xarelto Weight bearing as tolerated. Continue therapy.  Plan is to go Home after hospital stay. Plan for discharge later today if  progresses with therapy and pain controlled. Scheduled for OPPT at [facility]. Follow-up in the office in 2 weeks.  The PDMP database was reviewed today prior to any opioid medications being prescribed to this patient.  Theresa Duty, PA-C Orthopedic Surgery [phone] 11/08/2021, 7:22 AM

## 2021-11-08 NOTE — Progress Notes (Signed)
Physical Therapy Treatment Patient Details Name: Karen Scott MRN: [id number] DOB: [birth year] Today's Date: 11/08/2021   History of Present Illness Patient is 66 y/o female s/p Lt TKA and lipoma removal of Lt thigh on 11/07/21. PMH significant for Arthritis,DVT, CAD, Carpal Tunnel syndrome, abdominal wall hernia, adenomatous colon polyp, clotting disorder, depression, DM, diverticulosis, esophageal stricture, GERD, hepatic steatosis, hyperlipidemia, HTN, pneumonia, ventral hernia    PT Comments    Pt is progressing well with therapy. She ambulated ~60 ft using RW with Min assist, chair follow for safety, and completed HEP. Currently pt requires supervision for bed mobility and Min assist for transfers. Pt is limited by pain and lack of ROM. She will benefit from skilled therapy and recommending outpatient PT upon DC from hospital.   Recommendations for follow up therapy are one component of a multi-disciplinary discharge planning process, led by the attending physician.  Recommendations may be updated based on patient status, additional functional criteria and insurance authorization.  Follow Up Recommendations  Outpatient PT     Assistance Recommended at Discharge Intermittent Supervision/Assistance  Patient can return home with the following A little help with walking and/or transfers;A little help with bathing/dressing/bathroom;Assistance with cooking/housework;Direct supervision/assist for medications management;Assist for transportation;Help with stairs or ramp for entrance   Equipment Recommendations  Rolling walker (2 wheels)    Recommendations for Other Services       Precautions / Restrictions Precautions Precautions: Fall;Knee Precaution Booklet Issued: No Restrictions Weight Bearing Restrictions: No Other Position/Activity Restrictions: WBAT     Mobility  Bed Mobility Overal bed mobility: Needs Assistance Bed Mobility: Supine to Sit     Supine to sit:  Supervision          Transfers Overall transfer level: Needs assistance Equipment used: Rolling walker (2 wheels) Transfers: Sit to/from Stand Sit to Stand: Min assist           General transfer comment: Pt relied on VC's for hand and foot placement. Pt needed Min assist to complete rise.    Ambulation/Gait Ambulation/Gait assistance: +2 safety/equipment, Min assist Gait Distance (Feet): 60 Feet Assistive device: Rolling walker (2 wheels) Gait Pattern/deviations: Step-to pattern, WFL(Within Functional Limits), Antalgic, Trunk flexed Gait velocity: decr     General Gait Details: Pt was limited by pain. She ambulated ~20 ft then requested a seated rest break.    Stairs             Wheelchair Mobility    Modified Rankin (Stroke Patients Only)       Balance Overall balance assessment: Needs assistance Sitting-balance support: No upper extremity supported Sitting balance-Leahy Scale: Good     Standing balance support: Bilateral upper extremity supported, During functional activity, Reliant on assistive device for balance Standing balance-Leahy Scale: Fair                              Cognition Arousal/Alertness: Awake/alert Behavior During Therapy: WFL for tasks assessed/performed Overall Cognitive Status: Within Functional Limits for tasks assessed                                          Exercises Total Joint Exercises Ankle Circles/Pumps: AROM, 20 reps, Both, Seated Quad Sets: AROM, Strengthening, Left, 10 reps, Seated Heel Slides: AROM, 10 reps, Seated    General Comments        Pertinent  Vitals/Pain Pain Assessment Pain Assessment: 0-10 Pain Score: 10-Worst pain ever Pain Location: Lt knee Pain Descriptors / Indicators: Discomfort, Throbbing, Stabbing, Spasm Pain Intervention(s): Monitored during session, Patient requesting pain meds-RN notified, RN gave pain meds during session, Ice applied    Home Living                           Prior Function            PT Goals (current goals can now be found in the care plan section) Acute Rehab PT Goals Patient Stated Goal: To go home, be able to play with her grandson. PT Goal Formulation: With patient Time For Goal Achievement: 11/21/21 Potential to Achieve Goals: Good Progress towards PT goals: Progressing toward goals    Frequency    7X/week      PT Plan Current plan remains appropriate    Co-evaluation              AM-PAC PT "6 Clicks" Mobility   Outcome Measure  Help needed turning from your back to your side while in a flat bed without using bedrails?: None Help needed moving from lying on your back to sitting on the side of a flat bed without using bedrails?: None Help needed moving to and from a bed to a chair (including a wheelchair)?: A Little Help needed standing up from a chair using your arms (e.g., wheelchair or bedside chair)?: A Little Help needed to walk in hospital room?: A Little Help needed climbing 3-5 steps with a railing? : A Little 6 Click Score: 20    End of Session Equipment Utilized During Treatment: Gait belt Activity Tolerance: Patient limited by pain Patient left: in chair;with call bell/phone within reach;with chair alarm set Nurse Communication: Mobility status PT Visit Diagnosis: Unsteadiness on feet (R26.81);Other abnormalities of gait and mobility (R26.89);Pain Pain - Right/Left: Left Pain - part of body: Knee     Time:  -     Charges:                       Karen Scott, [place] 11/08/2021, 12:56 PM

## 2021-11-08 NOTE — Progress Notes (Signed)
Physical Therapy Treatment Patient Details Name: Karen Scott MRN: [id number] DOB: 1956/03/03 Today's Date: 11/08/2021   History of Present Illness Patient is 66 y/o female s/p Lt TKA and lipoma removal of Lt thigh on 11/07/21. PMH significant for Arthritis,DVT, CAD, Carpal Tunnel syndrome, abdominal wall hernia, adenomatous colon polyp, clotting disorder, depression, DM, diverticulosis, esophageal stricture, GERD, hepatic steatosis, hyperlipidemia, HTN, pneumonia, ventral hernia    PT Comments    Pt is progressing well with therapy. She ambulated ~15 ft to bathroom with RW and Min assist. She currently requires Supervision for bed mobility and Min guard for transfers. She is limited by pain even after taking dose of morning pain meds and does of afternoon pain meds. Limited exercises due to pain. Unable to progress to stair training, will plan to progress further tomorrow when pain is more controlled. Recommending pt continue skilled therapy and outpatient PT upon DC from hospital.   Recommendations for follow up therapy are one component of a multi-disciplinary discharge planning process, led by the attending physician.  Recommendations may be updated based on patient status, additional functional criteria and insurance authorization.  Follow Up Recommendations  Outpatient PT     Assistance Recommended at Discharge Intermittent Supervision/Assistance  Patient can return home with the following A little help with walking and/or transfers;A little help with bathing/dressing/bathroom;Assistance with cooking/housework;Direct supervision/assist for medications management;Assist for transportation;Help with stairs or ramp for entrance   Equipment Recommendations  Rolling walker (2 wheels)    Recommendations for Other Services       Precautions / Restrictions Precautions Precautions: Fall;Knee Precaution Booklet Issued: No Restrictions Weight Bearing Restrictions: No Other  Position/Activity Restrictions: WBAT     Mobility  Bed Mobility Overal bed mobility: Needs Assistance Bed Mobility: Sit to Supine     Supine to sit: Supervision Sit to supine: Supervision   General bed mobility comments: Pt able to lift LE onto bed, no assist.    Transfers Overall transfer level: Needs assistance Equipment used: Rolling walker (2 wheels) Transfers: Sit to/from Stand, Bed to chair/wheelchair/BSC Sit to Stand: Min guard           General transfer comment: Patient demonstrated good carryover for technique to stand from recliner. pt using bil UE to power with min guard for satey. pt using grab bar in bathroom to control lowering and rising at toilet. no assist needed from low surface.    Ambulation/Gait Ambulation/Gait assistance: +2 safety/equipment, Min guard Gait Distance (Feet): 15 Feet (Simultaneous filing. User may not have seen previous data.) Assistive device: Rolling walker (2 wheels) Gait Pattern/deviations: Step-to pattern, WFL(Within Functional Limits), Antalgic, Trunk flexed Gait velocity: decr     General Gait Details: Pt demonstrated good carryover from morning session for hand and feet placement while ambulating to bathroom. Gait limited d/t pain and pt returned to bed.   Stairs             Wheelchair Mobility    Modified Rankin (Stroke Patients Only)       Balance Overall balance assessment: Needs assistance Sitting-balance support: No upper extremity supported Sitting balance-Leahy Scale: Good     Standing balance support: Bilateral upper extremity supported, During functional activity, Reliant on assistive device for balance (Pt reported dizziness while standing at sink, lasting 2-3 sec.) Standing balance-Leahy Scale: Fair                              Cognition Arousal/Alertness: Awake/alert Behavior During  Therapy: WFL for tasks assessed/performed Overall Cognitive Status: Within Functional Limits for  tasks assessed                                          Exercises Total Joint Exercises Ankle Circles/Pumps: 20 reps, Seated, AROM, Both Quad Sets: AROM, Strengthening, Left, 10 reps, Seated Heel Slides: AROM, 10 reps, Seated    General Comments        Pertinent Vitals/Pain Pain Assessment Pain Assessment: 0-10 Pain Score: 8  Pain Location: Lt knee Pain Descriptors / Indicators: Discomfort, Throbbing, Stabbing, Spasm Pain Intervention(s): Limited activity within patient's tolerance, Monitored during session, RN gave pain meds during session, Ice applied    Home Living                          Prior Function            PT Goals (current goals can now be found in the care plan section) Acute Rehab PT Goals Patient Stated Goal: To go home, be able to play with her grandson. PT Goal Formulation: With patient Time For Goal Achievement: 11/21/21 Potential to Achieve Goals: Good Progress towards PT goals: Progressing toward goals    Frequency    7X/week      PT Plan Current plan remains appropriate    Co-evaluation              AM-PAC PT "6 Clicks" Mobility   Outcome Measure  Help needed turning from your back to your side while in a flat bed without using bedrails?: None Help needed moving from lying on your back to sitting on the side of a flat bed without using bedrails?: None Help needed moving to and from a bed to a chair (including a wheelchair)?: A Little Help needed standing up from a chair using your arms (e.g., wheelchair or bedside chair)?: A Little Help needed to walk in hospital room?: A Little Help needed climbing 3-5 steps with a railing? : A Little 6 Click Score: 20    End of Session Equipment Utilized During Treatment: Gait belt Activity Tolerance: Patient limited by pain Patient left: in bed;with call bell/phone within reach;with bed alarm set Nurse Communication: Mobility status PT Visit Diagnosis:  Unsteadiness on feet (R26.81);Other abnormalities of gait and mobility (R26.89);Pain Pain - Right/Left: Left Pain - part of body: Knee     Time: 3291-9166 PT Time Calculation (min) (ACUTE ONLY): 18 min  Charges:  $Gait Training: 8-22 mins                    [name], SPT [place] 11/08/2021, 4:47 PM

## 2021-11-08 NOTE — Plan of Care (Signed)
  Problem: Education: Goal: Ability to describe self-care measures that may prevent or decrease complications (Diabetes Survival Skills Education) will improve Outcome: Progressing   Problem: Coping: Goal: Ability to adjust to condition or change in health will improve Outcome: Progressing   Problem: Health Behavior/Discharge Planning: Goal: Ability to identify and utilize available resources and services will improve Outcome: Progressing   Problem: Nutritional: Goal: Maintenance of adequate nutrition will improve Outcome: Progressing   Problem: Skin Integrity: Goal: Risk for impaired skin integrity will decrease Outcome: Progressing

## 2021-11-08 NOTE — TOC Transition Note (Signed)
Transition of Care [facility]) - CM/SW Discharge Note   Patient Details  Name: Karen Scott MRN: [id number] Date of Birth: [birth year]  [facility]) CM/SW Contact:  Lennart Pall, LCSW Phone Number: 11/08/2021, 9:32 AM   Clinical Narrative:    Met with pt and confirming need for rolling walker - no DME agency pref - order placed with [employer] for delivery to room.  OPPT already arranged with [facility].  No further TOC needs.   Final next level of care: OP Rehab Barriers to Discharge: No Barriers Identified   Patient Goals and CMS Choice Patient states their goals for this hospitalization and ongoing recovery are:: return home      Discharge Placement                       Discharge Plan and Services                DME Arranged: Walker rolling DME Agency: [employer] Date DME Agency Contacted: 11/08/21 Time DME Agency Contacted: [phone] Representative spoke with at DME Agency: [place] Determinants of Health ([place]) Interventions     Readmission Risk Interventions     No data to display

## 2021-11-09 DIAGNOSIS — I2510 Atherosclerotic heart disease of native coronary artery without angina pectoris: Secondary | ICD-10-CM | POA: Diagnosis not present

## 2021-11-09 DIAGNOSIS — I10 Essential (primary) hypertension: Secondary | ICD-10-CM | POA: Diagnosis not present

## 2021-11-09 DIAGNOSIS — E785 Hyperlipidemia, unspecified: Secondary | ICD-10-CM | POA: Diagnosis not present

## 2021-11-09 DIAGNOSIS — Z9660 Presence of unspecified orthopedic joint implant: Secondary | ICD-10-CM | POA: Diagnosis not present

## 2021-11-09 DIAGNOSIS — M1712 Unilateral primary osteoarthritis, left knee: Secondary | ICD-10-CM | POA: Diagnosis not present

## 2021-11-09 DIAGNOSIS — Z7982 Long term (current) use of aspirin: Secondary | ICD-10-CM | POA: Diagnosis not present

## 2021-11-09 LAB — CBC
HCT: 30.8 % — ABNORMAL LOW (ref 36.0–46.0)
Hemoglobin: 10.1 g/dL — ABNORMAL LOW (ref 12.0–15.0)
MCH: 28.6 pg (ref 26.0–34.0)
MCHC: 32.8 g/dL (ref 30.0–36.0)
MCV: 87.3 fL (ref 80.0–100.0)
Platelets: 206 10*3/uL (ref 150–400)
RBC: 3.53 MIL/uL — ABNORMAL LOW (ref 3.87–5.11)
RDW: 13.3 % (ref 11.5–15.5)
WBC: 11.7 10*3/uL — ABNORMAL HIGH (ref 4.0–10.5)
nRBC: 0 % (ref 0.0–0.2)

## 2021-11-09 LAB — GLUCOSE, CAPILLARY
Glucose-Capillary: 175 mg/dL — ABNORMAL HIGH (ref 70–99)
Glucose-Capillary: 195 mg/dL — ABNORMAL HIGH (ref 70–99)
Glucose-Capillary: 200 mg/dL — ABNORMAL HIGH (ref 70–99)

## 2021-11-09 MED ORDER — HYDROMORPHONE HCL 2 MG PO TABS: 2.0000 mg | ORAL_TABLET | Freq: Four times a day (QID) | ORAL | 0 refills | Status: AC | PRN

## 2021-11-09 MED ORDER — HYDROMORPHONE HCL 2 MG PO TABS
4.0000 mg | ORAL_TABLET | ORAL | Status: DC | PRN
  Administered 2021-11-09 (×3): 4 mg via ORAL
  Filled 2021-11-09 (×3): qty 2

## 2021-11-09 MED ORDER — HYDROMORPHONE HCL 2 MG PO TABS: 2.0000 mg | ORAL_TABLET | ORAL | Status: DC | PRN

## 2021-11-09 NOTE — Progress Notes (Signed)
Provided discharge education/instructions, all questions and concerns addressed. Pt not in acute distress, discharged home with belongings accompanied by son. 

## 2021-11-09 NOTE — Progress Notes (Signed)
Physical Therapy Treatment Patient Details Name: Karen Scott MRN: [id number] DOB: [birth year] Today's Date: 11/09/2021   History of Present Illness Patient is 66 y/o female s/p Lt TKA and lipoma removal of Lt thigh on 11/07/21. PMH significant for Arthritis,DVT, CAD, Carpal Tunnel syndrome, abdominal wall hernia, adenomatous colon polyp, clotting disorder, depression, DM, diverticulosis, esophageal stricture, GERD, hepatic steatosis, hyperlipidemia, HTN, pneumonia, ventral hernia    PT Comments    Pt is continuing well with therapy but further progression to ambulate longer distances and complete stairs are limited d/t pain. This morning she ambulated ~15 ft to the bathroom and completed exercises for ROM relying on AAROM. Currently pt requires Min guard for transfers and gait. As of this morning, pt's pain meds have changed, hoping to be more effective than previous meds. Pt c/o quad spasms and is currently taking muscle relaxer's to address the issue. She will benefit from continued skilled therapy.   Recommendations for follow up therapy are one component of a multi-disciplinary discharge planning process, led by the attending physician.  Recommendations may be updated based on patient status, additional functional criteria and insurance authorization.  Follow Up Recommendations  Outpatient PT     Assistance Recommended at Discharge Intermittent Supervision/Assistance  Patient can return home with the following A little help with walking and/or transfers;A little help with bathing/dressing/bathroom;Assistance with cooking/housework;Direct supervision/assist for medications management;Assist for transportation;Help with stairs or ramp for entrance   Equipment Recommendations  None recommended by PT    Recommendations for Other Services       Precautions / Restrictions Precautions Precautions: Fall;Knee Precaution Booklet Issued: No Restrictions Weight Bearing Restrictions: No LLE  Weight Bearing: Weight bearing as tolerated     Mobility  Bed Mobility               General bed mobility comments: Pt OOB in recliner.    Transfers Overall transfer level: Needs assistance Equipment used: Rolling walker (2 wheels) Transfers: Sit to/from Stand Sit to Stand: Min guard           General transfer comment: Pt demonstrated good carryover from yesterday's session with hand placement and positioning RW.    Ambulation/Gait Ambulation/Gait assistance: Min guard Gait Distance (Feet): 15 Feet Assistive device: Rolling walker (2 wheels) Gait Pattern/deviations: Step-to pattern, WFL(Within Functional Limits), Antalgic Gait velocity: decr     General Gait Details: Pt demonstrated proper foot placement while ambulating to bathroom, with decreased time.   Stairs             Wheelchair Mobility    Modified Rankin (Stroke Patients Only)       Balance Overall balance assessment: Needs assistance Sitting-balance support: No upper extremity supported Sitting balance-Leahy Scale: Good Sitting balance - Comments: Pt able to sit at edge of recliner without external support.   Standing balance support: Bilateral upper extremity supported, During functional activity, Reliant on assistive device for balance Standing balance-Leahy Scale: Fair Standing balance comment: Pt relies on Bil UE support.                            Cognition Arousal/Alertness: Awake/alert Behavior During Therapy: WFL for tasks assessed/performed Overall Cognitive Status: Within Functional Limits for tasks assessed                                          Exercises  Total Joint Exercises Ankle Circles/Pumps: AROM, AAROM, 20 reps, Seated (First set 10 reps AAROM, second set AROM) Quad Sets: Both, 5 reps, Seated, AROM, Strengthening Heel Slides: AAROM, Left, 5 reps, Seated    General Comments        Pertinent Vitals/Pain Pain Assessment Pain  Assessment: 0-10 Pain Score: 9  Pain Location: Lt knee Pain Descriptors / Indicators: Burning, Discomfort, Throbbing, Tender, Spasm Pain Intervention(s): Limited activity within patient's tolerance, Premedicated before session, Ice applied    Home Living                          Prior Function            PT Goals (current goals can now be found in the care plan section) Acute Rehab PT Goals Patient Stated Goal: To go home, be able to play with her grandson. PT Goal Formulation: With patient Time For Goal Achievement: 11/21/21 Potential to Achieve Goals: Good Progress towards PT goals: Progressing toward goals (Pt needs pain decrease so she can ambulate further than 15 ft.)    Frequency    7X/week      PT Plan Current plan remains appropriate    Co-evaluation              AM-PAC PT "6 Clicks" Mobility   Outcome Measure  Help needed turning from your back to your side while in a flat bed without using bedrails?: None Help needed moving from lying on your back to sitting on the side of a flat bed without using bedrails?: None Help needed moving to and from a bed to a chair (including a wheelchair)?: A Little Help needed standing up from a chair using your arms (e.g., wheelchair or bedside chair)?: A Little Help needed to walk in hospital room?: A Little Help needed climbing 3-5 steps with a railing? : A Little 6 Click Score: 20    End of Session Equipment Utilized During Treatment: Gait belt Activity Tolerance: Patient limited by pain Patient left: in chair;with call bell/phone within reach;with chair alarm set Nurse Communication: Mobility status PT Visit Diagnosis: Unsteadiness on feet (R26.81);Other abnormalities of gait and mobility (R26.89);Pain Pain - Right/Left: Left Pain - part of body: Knee     Time:  -     Charges:                        [name], SPT [place] 11/09/2021, 10:40  AM

## 2021-11-09 NOTE — Plan of Care (Signed)
  Problem: Education: Goal: Ability to describe self-care measures that may prevent or decrease complications (Diabetes Survival Skills Education) will improve Outcome: Progressing   Problem: Coping: Goal: Ability to adjust to condition or change in health will improve Outcome: Progressing   Problem: Health Behavior/Discharge Planning: Goal: Ability to identify and utilize available resources and services will improve Outcome: Progressing   Problem: Metabolic: Goal: Ability to maintain appropriate glucose levels will improve Outcome: Progressing   Problem: Nutritional: Goal: Maintenance of adequate nutrition will improve Outcome: Progressing   Problem: Skin Integrity: Goal: Risk for impaired skin integrity will decrease Outcome: Progressing   Problem: Tissue Perfusion: Goal: Adequacy of tissue perfusion will improve Outcome: Progressing

## 2021-11-09 NOTE — Progress Notes (Signed)
   Subjective: 2 Days Post-Op Procedure(s) (LRB): Left total knee arthroplasty; removal lipoma left thigh (Left) Patient seen in rounds by Dr. Wynelle Link. Patient is  well. No issues overnight. Denies SOB or chest pain. Voiding without difficulty. Patient reports pain as severe.  Worked with physical therapy yesterday and ambulated 15'. Limited by pain. Plan is to go Home after hospital stay.  Objective: Vital signs in last 24 hours: Temp:  [97.8 F (36.6 C)-98.7 F (37.1 C)] 98.7 F (37.1 C) (06/14 0620) Pulse Rate:  [70-87] 87 (06/14 0620) Resp:  [14-18] 18 (06/14 0620) BP: (107-148)/(67-92) 148/92 (06/14 0620) SpO2:  [94 %-99 %] 99 % (06/14 0620)  Intake/Output from previous day:  Intake/Output Summary (Last 24 hours) at 11/09/2021 0727 Last data filed at 11/09/2021 0350 Gross per 24 hour  Intake 1763.39 ml  Output 1650 ml  Net 113.39 ml    Intake/Output this shift: No intake/output data recorded.  Labs: Recent Labs    11/08/21 0318 11/09/21 0337  HGB 10.4* 10.1*   Recent Labs    11/08/21 0318 11/09/21 0337  WBC 15.1* 11.7*  RBC 3.66* 3.53*  HCT 31.3* 30.8*  PLT 215 206   Recent Labs    11/08/21 0318  NA 138  K 3.5  CL 104  CO2 27  BUN 18  CREATININE 1.11*  GLUCOSE 190*  CALCIUM 8.5*   No results for input(s): "LABPT", "INR" in the last 72 hours.  Exam: General - Patient is Alert and Oriented Extremity - Neurologically intact Neurovascular intact Sensation intact distally Dorsiflexion/Plantar flexion intact Dressing/Incision - clean, dry, no drainage Motor Function - intact, moving foot and toes well on exam.  Past Medical History:  Diagnosis Date   Abdominal wall hernia    Adenomatous colon polyp    Anxiety    Arthritis    CAD (coronary artery disease)    Carpal tunnel syndrome    Clotting disorder (HCC)    Depression    Diabetes mellitus without complication (HCC)    Diverticulosis    DVT (deep venous thrombosis) (HCC)    DVT (deep  venous thrombosis) (HCC)    L leg   Esophageal stricture    GERD (gastroesophageal reflux disease)    Hepatic steatosis    Hyperlipidemia    Hypertension    Internal hemorrhoids    Migraines    Ovarian cyst    Pneumonia    Shingles    Tubular adenoma of colon    Ventral hernia     Assessment/Plan: 2 Days Post-Op Procedure(s) (LRB): Left total knee arthroplasty; removal lipoma left thigh (Left) Principal Problem:   OA (osteoarthritis) of knee Active Problems:   Osteoarthritis of left knee  Estimated body mass index is 26.15 kg/m as calculated from the following:   Height as of this encounter: 5' 3.5" (1.613 m).   Weight as of this encounter: 68 kg. Advance diet Up with therapy D/C IV fluids  DVT Prophylaxis - Xarelto Weight-bearing as tolerated.  Pain not well managed with oxycodone. Will discontinue oxy and switch to dilaudid. Continue with physical therapy. Expected discharge home today if pain well managed and meeting patient goals with physical therapy. Scheduled for OPPT at [facility]. Follow-up in office in 2 weeks.  The PDMP database was reviewed today prior to any opioid medications being prescribed to this patient.  R. Jaynie Bream, PA-C Orthopedic Surgery [phone] 11/09/2021, 7:27 AM

## 2021-11-09 NOTE — Progress Notes (Signed)
Physical Therapy Treatment Patient Details Name: Karen Scott MRN: [id number] DOB: 1956-02-28 Today's Date: 11/09/2021   History of Present Illness Patient is 66 y/o female s/p Lt TKA and lipoma removal of Lt thigh on 11/07/21. PMH significant for Arthritis,DVT, CAD, Carpal Tunnel syndrome, abdominal wall hernia, adenomatous colon polyp, clotting disorder, depression, DM, diverticulosis, esophageal stricture, GERD, hepatic steatosis, hyperlipidemia, HTN, pneumonia, ventral hernia    PT Comments    Pt is progressing well with therapy. She ambulated ~120 ft today with Min guard and RW to the PT gym and completed stairs safely. Currently pt requires Min guard for bed mobility and Min assist for transfers. She is making progress with pain control and safe to return home.  Recommending continued skilled therapy with outpatient PT.   Recommendations for follow up therapy are one component of a multi-disciplinary discharge planning process, led by the attending physician.  Recommendations may be updated based on patient status, additional functional criteria and insurance authorization.  Follow Up Recommendations  Outpatient PT     Assistance Recommended at Discharge Intermittent Supervision/Assistance  Patient can return home with the following A little help with walking and/or transfers;A little help with bathing/dressing/bathroom;Assistance with cooking/housework;Direct supervision/assist for medications management;Assist for transportation;Help with stairs or ramp for entrance   Equipment Recommendations  None recommended by PT    Recommendations for Other Services       Precautions / Restrictions Precautions Precautions: Fall;Knee Precaution Booklet Issued: No Restrictions Weight Bearing Restrictions: No LLE Weight Bearing: Weight bearing as tolerated     Mobility  Bed Mobility Overal bed mobility: Needs Assistance Bed Mobility: Supine to Sit     Supine to sit: Min guard      General bed mobility comments: Pt relied on VC's from therapist to lasso gait belt onto Lt foot to move for transfer.    Transfers Overall transfer level: Needs assistance Equipment used: Rolling walker (2 wheels) Transfers: Sit to/from Stand Sit to Stand: Min assist           General transfer comment: Pt relied on min assist to complete rise.    Ambulation/Gait Ambulation/Gait assistance: Min guard Gait Distance (Feet): 120 Feet Assistive device: Rolling walker (2 wheels) Gait Pattern/deviations: Step-to pattern, WFL(Within Functional Limits), Decreased stride length, Antalgic Gait velocity: decr     General Gait Details: Pt ambulated 15 ft to bathroom, reported dizziness when moving to the sink. Pt ambulated 54 ft with chair follow for safety, needed a sit break. Pt continued ambulating 60 ft into the gym.   Stairs Stairs: Yes Stairs assistance: Min guard Stair Management: Two rails, Forwards, Step to pattern Number of Stairs: 3 General stair comments: Pt initiated stair training with VC's for sequencing, she used nonoperative leg for ascending and operative leg for descending. She completed stairs efficiently and safely.   Wheelchair Mobility    Modified Rankin (Stroke Patients Only)       Balance Overall balance assessment: Needs assistance Sitting-balance support: No upper extremity supported Sitting balance-Leahy Scale: Good Sitting balance - Comments:  (Pt able to sit to EOB needing no external support.)   Standing balance support: Bilateral upper extremity supported, During functional activity, Reliant on assistive device for balance Standing balance-Leahy Scale: Fair Standing balance comment: Pt reports slight dizziness while in standing. Relies on Bil UE support.                            Cognition Arousal/Alertness: Awake/alert Behavior  During Therapy: WFL for tasks assessed/performed Overall Cognitive Status: Within Functional Limits  for tasks assessed                                          Exercises Total Joint Exercises Ankle Circles/Pumps: AROM, 20 reps, Both, Seated Quad Sets: AROM, Strengthening, 10 reps, Seated Heel Slides: AAROM, 10 reps, Seated Hip ABduction/ADduction: AAROM, Left, 10 reps, Seated (Pt used gait belt to assist with sliding.)    General Comments        Pertinent Vitals/Pain Pain Assessment Pain Assessment: 0-10 Pain Score: 8  Pain Location: Lt knee Pain Descriptors / Indicators: Burning, Discomfort, Throbbing, Tender, Spasm Pain Intervention(s): Monitored during session, Premedicated before session, Ice applied    Home Living                          Prior Function            PT Goals (current goals can now be found in the care plan section) Acute Rehab PT Goals Patient Stated Goal: Have less pain and to go home. PT Goal Formulation: With patient Time For Goal Achievement: 11/21/21 Potential to Achieve Goals: Good Progress towards PT goals: Progressing toward goals    Frequency    7X/week      PT Plan Current plan remains appropriate    Co-evaluation              AM-PAC PT "6 Clicks" Mobility   Outcome Measure  Help needed turning from your back to your side while in a flat bed without using bedrails?: None Help needed moving from lying on your back to sitting on the side of a flat bed without using bedrails?: None Help needed moving to and from a bed to a chair (including a wheelchair)?: A Little Help needed standing up from a chair using your arms (e.g., wheelchair or bedside chair)?: A Little Help needed to walk in hospital room?: A Little Help needed climbing 3-5 steps with a railing? : A Little 6 Click Score: 20    End of Session Equipment Utilized During Treatment: Gait belt Activity Tolerance: Patient tolerated treatment well Patient left: in chair;with call bell/phone within reach;with chair alarm set Nurse  Communication: Mobility status PT Visit Diagnosis: Unsteadiness on feet (R26.81);Other abnormalities of gait and mobility (R26.89);Pain Pain - Right/Left: Left Pain - part of body: Knee     Time: 9179-1505 PT Time Calculation (min) (ACUTE ONLY): 36 min  Charges:  $Gait Training: 8-22 mins $Therapeutic Exercise: 8-22 mins                    [name], SPT [place] 11/09/2021, 3:59 PM

## 2021-11-09 NOTE — Progress Notes (Signed)
Patient continues to have complaints of pain level of 8-10. Patient ambulated to the bathroom overnight.

## 2021-11-10 DIAGNOSIS — M25562 Pain in left knee: Secondary | ICD-10-CM | POA: Diagnosis not present

## 2021-11-11 NOTE — Discharge Summary (Signed)
Physician Discharge Summary   Patient ID: Karen Scott MRN: [id number] DOB/AGE: [birth year] 69 y.o.  Admit date: 11/07/2021 Discharge date: 11/09/2021  Primary Diagnosis: Osteoarthritis of the left knee   Admission Diagnoses:  Past Medical History:  Diagnosis Date   Abdominal wall hernia    Adenomatous colon polyp    Anxiety    Arthritis    CAD (coronary artery disease)    Carpal tunnel syndrome    Clotting disorder ([place])    Depression    Diabetes mellitus without complication ([place])    Diverticulosis    DVT (deep venous thrombosis) (HCC)    DVT (deep venous thrombosis) (HCC)    L leg   Esophageal stricture    GERD (gastroesophageal reflux disease)    Hepatic steatosis    Hyperlipidemia    Hypertension    Internal hemorrhoids    Migraines    Ovarian cyst    Pneumonia    Shingles    Tubular adenoma of colon    Ventral hernia    Discharge Diagnoses:   Principal Problem:   OA (osteoarthritis) of knee Active Problems:   Osteoarthritis of left knee  Estimated body mass index is 26.15 kg/m as calculated from the following:   Height as of this encounter: 5' 3.5" (1.613 m).   Weight as of this encounter: 68 kg.  Procedure:  Procedure(s) (LRB): Left total knee arthroplasty; removal lipoma left thigh (Left)   Consults: None  HPI:  Karen Scott is a 66 y.o. year old female with end stage OA of her left knee with progressively worsening pain and dysfunction. She has constant pain, with activity and at rest and significant functional deficits with difficulties even with ADLs. She has had extensive non-op management including analgesics, injections of cortisone and viscosupplements, and home exercise program, but remains in significant pain with significant dysfunction. Radiographs show bone on bone arthritis medial and patellofemoral. She presents now for left Total Knee Arthroplasty.  She also has a soft tissue mass left thigh consistent with a lipoma and presents for  removal of the mass  Laboratory Data: Admission on 11/07/2021, Discharged on 11/09/2021  Component Date Value Ref Range Status   ABO/RH(D) 11/07/2021    Final                   Value:A POS Performed at [facility], [place] [address], [place], [place] [address]    Glucose-Capillary 11/07/2021 207 (H)  70 - 99 mg/dL Final   Glucose reference range applies only to samples taken after fasting for at least 8 hours.   SURGICAL PATHOLOGY 11/07/2021    Final-Edited                   Value:SURGICAL PATHOLOGY CASE: [id number] PATIENT: Karen Scott Surgical Pathology Report     Clinical History: left knee osteoarthritis, left thigh lipoma ([facility])     FINAL MICROSCOPIC DIAGNOSIS:  A.   SOFT TISSUE MASS, LEFT THIGH, EXCISION: -    Lipoma.    GROSS DESCRIPTION:  Received fresh is a 4.6 x 4.0 x 1.4 cm mass of well lobulated, yellow adipose.  Sectioning reveals homogenous, fatty cut surfaces.  A representative section is submitted in 1 block. Amparo Bristol, 11/07/2021)    Final Diagnosis performed by Maryan Puls, MD.   Electronically signed 11/08/2021 Technical component performed at [facility], [place] [address] [place], [place] [address].  Professional component performed at [employer]. [facility], [place]  [address], [place], [place] [address].  Immunohistochemistry Technical component (if applicable) was performed at [facility]. [address] [place], [place], [place] [address].   IMMUNOHISTOCHEMISTRY D                         ISCLAIMER (if applicable): Some of these immunohistochemical stains may have been developed and the performance characteristics determine by [facility]. Some may not have been cleared or approved by the U.S. Food and Drug Administration. The FDA has determined that such clearance or approval is not necessary. This test is used for clinical purposes. It should not be regarded as  investigational or for research. This laboratory is certified under the [place] (CLIA-88) as qualified to perform high complexity clinical laboratory testing.  The controls stained appropriately.    Glucose-Capillary 11/07/2021 156 (H)  70 - 99 mg/dL Final   Glucose reference range applies only to samples taken after fasting for at least 8 hours.   Comment 1 11/07/2021 Document in Chart   Final   Glucose-Capillary 11/07/2021 172 (H)  70 - 99 mg/dL Final   Glucose reference range applies only to samples taken after fasting for at least 8 hours.   WBC 11/08/2021 15.1 (H)  4.0 - 10.5 K/uL Final   RBC 11/08/2021 3.66 (L)  3.87 - 5.11 MIL/uL Final   Hemoglobin 11/08/2021 10.4 (L)  12.0 - 15.0 g/dL Final   HCT 11/08/2021 31.3 (L)  36.0 - 46.0 % Final   MCV 11/08/2021 85.5  80.0 - 100.0 fL Final   MCH 11/08/2021 28.4  26.0 - 34.0 pg Final   MCHC 11/08/2021 33.2  30.0 - 36.0 g/dL Final   RDW 11/08/2021 12.9  11.5 - 15.5 % Final   Platelets 11/08/2021 215  150 - 400 K/uL Final   nRBC 11/08/2021 0.0  0.0 - 0.2 % Final   Performed at [facility], [place] [address], [place], [place] [address]   Sodium 11/08/2021 138  135 - 145 mmol/L Final   Potassium 11/08/2021 3.5  3.5 - 5.1 mmol/L Final   Chloride 11/08/2021 104  98 - 111 mmol/L Final   CO2 11/08/2021 27  22 - 32 mmol/L Final   Glucose, Bld 11/08/2021 190 (H)  70 - 99 mg/dL Final   Glucose reference range applies only to samples taken after fasting for at least 8 hours.   BUN 11/08/2021 18  8 - 23 mg/dL Final   Creatinine, Ser 11/08/2021 1.11 (H)  0.44 - 1.00 mg/dL Final   Calcium 11/08/2021 8.5 (L)  8.9 - 10.3 mg/dL Final   GFR, Estimated 11/08/2021 54 (L)  >60 mL/min Final   Comment: (NOTE) Calculated using the CKD-EPI Creatinine Equation (2021)    Anion gap 11/08/2021 7  5 - 15 Final   Performed at [facility], [place] [address] [place], [place] [address]    Glucose-Capillary 11/07/2021 299 (H)  70 - 99 mg/dL Final   Glucose reference range applies only to samples taken after fasting for at least 8 hours.   Glucose-Capillary 11/07/2021 227 (H)  70 - 99 mg/dL Final   Glucose reference range applies only to samples taken after fasting for at least 8 hours.   Glucose-Capillary 11/08/2021 167 (H)  70 - 99 mg/dL Final   Glucose reference range applies only to samples taken after fasting for at least 8 hours.   Glucose-Capillary 11/08/2021 189 (H)  70 -  99 mg/dL Final   Glucose reference range applies only to samples taken after fasting for at least 8 hours.   Glucose-Capillary 11/08/2021 274 (H)  70 - 99 mg/dL Final   Glucose reference range applies only to samples taken after fasting for at least 8 hours.   WBC 11/09/2021 11.7 (H)  4.0 - 10.5 K/uL Final   RBC 11/09/2021 3.53 (L)  3.87 - 5.11 MIL/uL Final   Hemoglobin 11/09/2021 10.1 (L)  12.0 - 15.0 g/dL Final   HCT 11/09/2021 30.8 (L)  36.0 - 46.0 % Final   MCV 11/09/2021 87.3  80.0 - 100.0 fL Final   MCH 11/09/2021 28.6  26.0 - 34.0 pg Final   MCHC 11/09/2021 32.8  30.0 - 36.0 g/dL Final   RDW 11/09/2021 13.3  11.5 - 15.5 % Final   Platelets 11/09/2021 206  150 - 400 K/uL Final   nRBC 11/09/2021 0.0  0.0 - 0.2 % Final   Performed at [facility], [place] [address], [place], [place] [address]   Glucose-Capillary 11/08/2021 247 (H)  70 - 99 mg/dL Final   Glucose reference range applies only to samples taken after fasting for at least 8 hours.   Glucose-Capillary 11/09/2021 175 (H)  70 - 99 mg/dL Final   Glucose reference range applies only to samples taken after fasting for at least 8 hours.   Glucose-Capillary 11/09/2021 195 (H)  70 - 99 mg/dL Final   Glucose reference range applies only to samples taken after fasting for at least 8 hours.   Glucose-Capillary 11/09/2021 200 (H)  70 - 99 mg/dL Final   Glucose reference range applies only to samples taken after fasting for at least  8 hours.  Hospital Outpatient Visit on 10/26/2021  Component Date Value Ref Range Status   Hgb A1c MFr Bld 10/26/2021 7.6 (H)  4.8 - 5.6 % Final   Comment: (NOTE) Pre diabetes:          5.7%-6.4%  Diabetes:              >6.4%  Glycemic control for   <7.0% adults with diabetes    Mean Plasma Glucose 10/26/2021 171.42  mg/dL Final   Performed at [facility], [place] [address] [place], [place] [address]   WBC 10/26/2021 6.7  4.0 - 10.5 K/uL Final   RBC 10/26/2021 4.63  3.87 - 5.11 MIL/uL Final   Hemoglobin 10/26/2021 13.0  12.0 - 15.0 g/dL Final   HCT 10/26/2021 39.7  36.0 - 46.0 % Final   MCV 10/26/2021 85.7  80.0 - 100.0 fL Final   MCH 10/26/2021 28.1  26.0 - 34.0 pg Final   MCHC 10/26/2021 32.7  30.0 - 36.0 g/dL Final   RDW 10/26/2021 13.3  11.5 - 15.5 % Final   Platelets 10/26/2021 250  150 - 400 K/uL Final   nRBC 10/26/2021 0.0  0.0 - 0.2 % Final   Performed at [facility], [place] [address] [place], [place] [address]   Sodium 10/26/2021 140  135 - 145 mmol/L Final   Potassium 10/26/2021 3.9  3.5 - 5.1 mmol/L Final   Chloride 10/26/2021 105  98 - 111 mmol/L Final   CO2 10/26/2021 26  22 - 32 mmol/L Final   Glucose, Bld 10/26/2021 189 (H)  70 - 99 mg/dL Final   Glucose reference range applies only to samples taken after fasting for at least 8 hours.   BUN 10/26/2021 18  8 - 23 mg/dL  Final   Creatinine, Ser 10/26/2021 1.08 (H)  0.44 - 1.00 mg/dL Final   Calcium 10/26/2021 9.6  8.9 - 10.3 mg/dL Final   Total Protein 10/26/2021 7.2  6.5 - 8.1 g/dL Final   Albumin 10/26/2021 3.9  3.5 - 5.0 g/dL Final   AST 10/26/2021 19  15 - 41 U/L Final   ALT 10/26/2021 13  0 - 44 U/L Final   Alkaline Phosphatase 10/26/2021 68  38 - 126 U/L Final   Total Bilirubin 10/26/2021 0.8  0.3 - 1.2 mg/dL Final   GFR, Estimated 10/26/2021 56 (L)  >60 mL/min Final   Comment: (NOTE) Calculated using the CKD-EPI Creatinine Equation (2021)    Anion gap 10/26/2021 9  5 - 15 Final    Performed at [facility], [place] [address] [place], [place] [address]   ABO/RH(D) 10/26/2021 A POS   Final   Antibody Screen 10/26/2021 NEG   Final   Sample Expiration 10/26/2021 11/09/2021,2359   Final   Extend sample reason 10/26/2021    Final                   Value:NO TRANSFUSIONS OR PREGNANCY IN THE PAST 3 MONTHS Performed at [facility], [place] [address], [place], [place] [address]    MRSA, PCR 10/26/2021 NEGATIVE  NEGATIVE Final   Staphylococcus aureus 10/26/2021 NEGATIVE  NEGATIVE Final   Comment: (NOTE) The Xpert SA Assay (FDA approved for NASAL specimens in patients 73 years of age and older), is one component of a comprehensive surveillance program. It is not intended to diagnose infection nor to guide or monitor treatment. Performed at [facility], [place] [address] [place], [place] [address]    Glucose-Capillary 10/26/2021 189 (H)  70 - 99 mg/dL Final   Glucose reference range applies only to samples taken after fasting for at least 8 hours.     X-Rays:No results found.  EKG: Orders placed or performed during the hospital encounter of 10/26/21   EKG 12 lead per protocol   EKG 12 lead per protocol     Hospital Course: Karen Scott is a 66 y.o. who was admitted to [facility] They were brought to the operating room on 11/07/2021 and underwent Procedure(s): Left total knee arthroplasty; removal lipoma left thigh.  Patient tolerated the procedure well and was later transferred to the recovery room and then to the orthopaedic floor for postoperative care. They were given PO and IV analgesics for pain control following their surgery. They were given 24 hours of postoperative antibiotics of  Anti-infectives (From admission, onward)    Start     Dose/Rate Route Frequency Ordered Stop   11/07/21 1830  vancomycin (VANCOCIN) IVPB 1000 mg/200 mL premix        1,000 mg 200 mL/hr over 60 Minutes Intravenous Every 12  hours 11/07/21 1014 11/07/21 1848   11/07/21 0600  vancomycin (VANCOCIN) IVPB 1000 mg/200 mL premix        1,000 mg 200 mL/hr over 60 Minutes Intravenous On call to O.R. 11/07/21 0530 11/07/21 0730     and started on DVT prophylaxis in the form of Xarelto.   PT and OT were ordered for total joint protocol. Discharge planning consulted to help with postop disposition and equipment needs. Patient had a fair night on the evening of surgery. She started to get up OOB with therapy on POD #0. Continued to work with therapy into POD #2. Patient was seen during  rounds on day two and was ready to go home pending progress with therapy. Patient worked with therapy for 4 additional sessions and was meeting their goals. Dressing was changed and the incision was C/D/I. She was discharged to home later that day in stable condition.  Diet: Diabetic diet Activity: WBAT Follow-up: in 2 weeks Disposition: Home Discharged Condition: stable   Discharge Instructions     Call MD / Call 911   Complete by: As directed    If you experience chest pain or shortness of breath, CALL 911 and be transported to the hospital emergency room.  If you develope a fever above 101 F, pus (white drainage) or increased drainage or redness at the wound, or calf pain, call your surgeon's office.   Change dressing   Complete by: As directed    You may remove the bulky bandage (ACE wrap and gauze) two days after surgery. You will have an adhesive waterproof bandage underneath. Leave this in place until your first follow-up appointment.   Constipation Prevention   Complete by: As directed    Drink plenty of fluids.  Prune juice may be helpful.  You may use a stool softener, such as Colace (over the counter) 100 mg twice a day.  Use MiraLax (over the counter) for constipation as needed.   Diet - low sodium heart healthy   Complete by: As directed    Do not put a pillow under the knee. Place it under the heel.   Complete by: As  directed    Driving restrictions   Complete by: As directed    No driving for two weeks   Post-operative opioid taper instructions:   Complete by: As directed    POST-OPERATIVE OPIOID TAPER INSTRUCTIONS: It is important to wean off of your opioid medication as soon as possible. If you do not need pain medication after your surgery it is ok to stop day one. Opioids include: Codeine, Hydrocodone(Norco, Vicodin), Oxycodone(Percocet, oxycontin) and hydromorphone amongst others.  Long term and even short term use of opiods can cause: Increased pain response Dependence Constipation Depression Respiratory depression And more.  Withdrawal symptoms can include Flu like symptoms Nausea, vomiting And more Techniques to manage these symptoms Hydrate well Eat regular healthy meals Stay active Use relaxation techniques(deep breathing, meditating, yoga) Do Not substitute Alcohol to help with tapering If you have been on opioids for less than two weeks and do not have pain than it is ok to stop all together.  Plan to wean off of opioids This plan should start within one week post op of your joint replacement. Maintain the same interval or time between taking each dose and first decrease the dose.  Cut the total daily intake of opioids by one tablet each day Next start to increase the time between doses. The last dose that should be eliminated is the evening dose.      TED hose   Complete by: As directed    Use stockings (TED hose) for three weeks on both leg(s).  You may remove them at night for sleeping.   Weight bearing as tolerated   Complete by: As directed       Allergies as of 11/09/2021       Reactions   Penicillins Anaphylaxis   All "myocins" : unknown Has patient had a PCN reaction causing immediate rash, facial/tongue/throat swelling, SOB or lightheadedness with hypotension: n/a Has patient had a PCN reaction causing severe rash involving mucus membranes or skin necrosis:  n/a Has patient had a PCN reaction that required hospitalization: n/a Has patient had a PCN reaction occurring within the last 10 years: n/a If all of the above answers are "NO", then may proceed with Cephalosporin use.   Erythromycin Other (See Comments)   All in this class, was a child   Tramadol Itching        Medication List     STOP taking these medications    aspirin 81 MG chewable tablet   cyclobenzaprine 5 MG tablet Commonly known as: FLEXERIL   diclofenac Sodium 1 % Gel Commonly known as: Voltaren   FISH OIL PO   ibuprofen 200 MG tablet Commonly known as: ADVIL   therapeutic multivitamin-minerals tablet       TAKE these medications    acetaminophen 500 MG tablet Commonly known as: TYLENOL Take 1,000 mg by mouth every 6 (six) hours as needed for mild pain. Notes to patient: Last dose given 06/13 07:42am   atorvastatin 20 MG tablet Commonly known as: LIPITOR Take 20 mg by mouth every evening.   dicyclomine 20 MG tablet Commonly known as: BENTYL Take 20 mg by mouth daily as needed for spasms. Notes to patient: Resume home regimen   FLUoxetine 20 MG capsule Commonly known as: PROZAC Take 3 capsules (60 mg total) by mouth daily. What changed: how much to take   gabapentin 300 MG capsule Commonly known as: NEURONTIN Take a 300 mg capsule three times a day for two weeks following surgery.Then take a 300 mg capsule two times a day for two weeks. Then take a 300 mg capsule once a day for two weeks. Then discontinue.   HYDROmorphone 2 MG tablet Commonly known as: DILAUDID Take 1-2 tablets (2-4 mg total) by mouth every 6 (six) hours as needed for severe pain. Notes to patient: Last dose given 06/14 08:46am   losartan-hydrochlorothiazide 100-25 MG tablet Commonly known as: HYZAAR Take 1 tablet by mouth every morning.   mesalamine 0.375 g 24 hr capsule Commonly known as: Apriso Take 4 capsules (1.5 g total) by mouth daily. What changed:  when to  take this reasons to take this Notes to patient: Resume home regimen   metFORMIN 500 MG 24 hr tablet Commonly known as: GLUCOPHAGE-XR Take 500 mg by mouth 2 (two) times daily. Notes to patient: Resume home regimen   ondansetron 4 MG disintegrating tablet Commonly known as: ZOFRAN-ODT Take 4 mg by mouth every 8 (eight) hours as needed for nausea or vomiting. Notes to patient: Last dose given 06/12 08:45am   pantoprazole 40 MG tablet Commonly known as: PROTONIX Take 40 mg by mouth 2 (two) times daily.   rivaroxaban 10 MG Tabs tablet Commonly known as: XARELTO Take 1 tablet (10 mg total) by mouth daily with breakfast for 20 days. Then resume one 81 mg aspirin once a day.   rizatriptan 10 MG disintegrating tablet Commonly known as: MAXALT-MLT Take 1 tablet (10 mg total) by mouth as needed for migraine. May repeat in 2 hours if needed What changed: when to take this Notes to patient: Resume home regimen               Discharge Care Instructions  (From admission, onward)           Start     Ordered   11/08/21 0000  Weight bearing as tolerated        11/08/21 0727   11/08/21 0000  Change dressing       Comments: You  may remove the bulky bandage (ACE wrap and gauze) two days after surgery. You will have an adhesive waterproof bandage underneath. Leave this in place until your first follow-up appointment.   11/08/21 0727            Follow-up Information     Gaynelle Arabian, MD. Schedule an appointment as soon as possible for a visit in 2 week(s).   Specialty: Orthopedic Surgery Contact information: [address] [place] [address] [phone]                 Signed: R. Jaynie Bream, PA-C Orthopedic Surgery 11/11/2021, 5:44 PM

## 2021-11-14 ENCOUNTER — Other Ambulatory Visit (HOSPITAL_COMMUNITY): Payer: Self-pay | Admitting: Orthopedic Surgery

## 2021-11-14 ENCOUNTER — Ambulatory Visit (HOSPITAL_COMMUNITY)
Admission: RE | Admit: 2021-11-14 | Discharge: 2021-11-14 | Disposition: A | Discharge status: Home / Self Care | Payer: Medicare Other | Source: Ambulatory Visit | Attending: Orthopedic Surgery | Admitting: Orthopedic Surgery

## 2021-11-14 DIAGNOSIS — M7989 Other specified soft tissue disorders: Secondary | ICD-10-CM | POA: Diagnosis not present

## 2021-11-14 DIAGNOSIS — M79605 Pain in left leg: Secondary | ICD-10-CM

## 2021-11-14 DIAGNOSIS — M25562 Pain in left knee: Secondary | ICD-10-CM | POA: Diagnosis not present

## 2021-11-16 DIAGNOSIS — M25562 Pain in left knee: Secondary | ICD-10-CM | POA: Diagnosis not present

## 2021-11-21 DIAGNOSIS — M25562 Pain in left knee: Secondary | ICD-10-CM | POA: Diagnosis not present

## 2021-11-23 DIAGNOSIS — M25562 Pain in left knee: Secondary | ICD-10-CM | POA: Diagnosis not present

## 2021-11-25 DIAGNOSIS — M25562 Pain in left knee: Secondary | ICD-10-CM | POA: Diagnosis not present

## 2021-12-01 DIAGNOSIS — M25562 Pain in left knee: Secondary | ICD-10-CM | POA: Diagnosis not present

## 2021-12-05 DIAGNOSIS — M25562 Pain in left knee: Secondary | ICD-10-CM | POA: Diagnosis not present

## 2021-12-05 DIAGNOSIS — E1159 Type 2 diabetes mellitus with other circulatory complications: Secondary | ICD-10-CM | POA: Diagnosis not present

## 2021-12-08 ENCOUNTER — Ambulatory Visit: Payer: Medicare Other | Admitting: Neurology

## 2021-12-12 DIAGNOSIS — K219 Gastro-esophageal reflux disease without esophagitis: Secondary | ICD-10-CM | POA: Diagnosis not present

## 2021-12-12 DIAGNOSIS — R110 Nausea: Secondary | ICD-10-CM | POA: Diagnosis not present

## 2021-12-12 DIAGNOSIS — E782 Mixed hyperlipidemia: Secondary | ICD-10-CM | POA: Diagnosis not present

## 2021-12-12 DIAGNOSIS — K589 Irritable bowel syndrome without diarrhea: Secondary | ICD-10-CM | POA: Diagnosis not present

## 2021-12-12 DIAGNOSIS — I152 Hypertension secondary to endocrine disorders: Secondary | ICD-10-CM | POA: Diagnosis not present

## 2021-12-12 DIAGNOSIS — G43409 Hemiplegic migraine, not intractable, without status migrainosus: Secondary | ICD-10-CM | POA: Diagnosis not present

## 2021-12-12 DIAGNOSIS — M1712 Unilateral primary osteoarthritis, left knee: Secondary | ICD-10-CM | POA: Diagnosis not present

## 2021-12-12 DIAGNOSIS — E1159 Type 2 diabetes mellitus with other circulatory complications: Secondary | ICD-10-CM | POA: Diagnosis not present

## 2021-12-12 DIAGNOSIS — Z79899 Other long term (current) drug therapy: Secondary | ICD-10-CM | POA: Diagnosis not present

## 2021-12-12 DIAGNOSIS — M25562 Pain in left knee: Secondary | ICD-10-CM | POA: Diagnosis not present

## 2021-12-13 DIAGNOSIS — Z5189 Encounter for other specified aftercare: Secondary | ICD-10-CM | POA: Diagnosis not present

## 2022-01-18 DIAGNOSIS — Z5189 Encounter for other specified aftercare: Secondary | ICD-10-CM | POA: Diagnosis not present

## 2022-02-24 DIAGNOSIS — Z23 Encounter for immunization: Secondary | ICD-10-CM | POA: Diagnosis not present

## 2022-03-03 DIAGNOSIS — Z23 Encounter for immunization: Secondary | ICD-10-CM | POA: Diagnosis not present

## 2022-05-12 DIAGNOSIS — Z5189 Encounter for other specified aftercare: Secondary | ICD-10-CM | POA: Diagnosis not present

## 2022-05-18 DIAGNOSIS — Z6828 Body mass index (BMI) 28.0-28.9, adult: Secondary | ICD-10-CM | POA: Diagnosis not present

## 2022-05-18 DIAGNOSIS — R051 Acute cough: Secondary | ICD-10-CM | POA: Diagnosis not present

## 2022-05-29 IMAGING — CT CT ANGIO CHEST
3 of 7 series · 19 of 46 positions shown · IV contrast (APPLIED)
Comparison: None.

CLINICAL DATA: Chest pain.

EXAM:
CT ANGIOGRAPHY CHEST WITH CONTRAST
TECHNIQUE: Multidetector CT imaging of the chest was performed using the
standard protocol during bolus administration of intravenous
contrast. Multiplanar CT image reconstructions and MIPs were
obtained to evaluate the vascular anatomy.
CONTRAST:  100mL OMNIPAQUE IOHEXOL 350 MG/ML SOLN

[Series 6: thins · axial · 0.76mm/px · z∈[-329,-102]mm · 15 of 261 slices shown]
[im 17/261  lung]
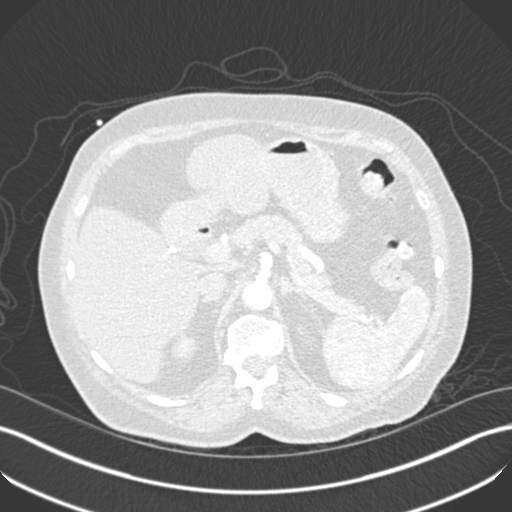
[im 33/261  soft-tissue]
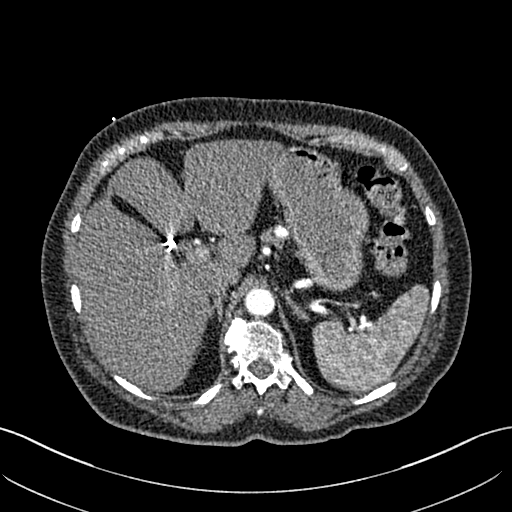
[im 49/261  lung]
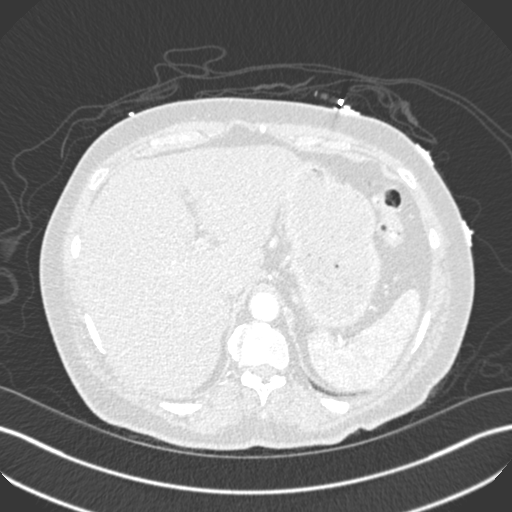
[im 66/261  soft-tissue]
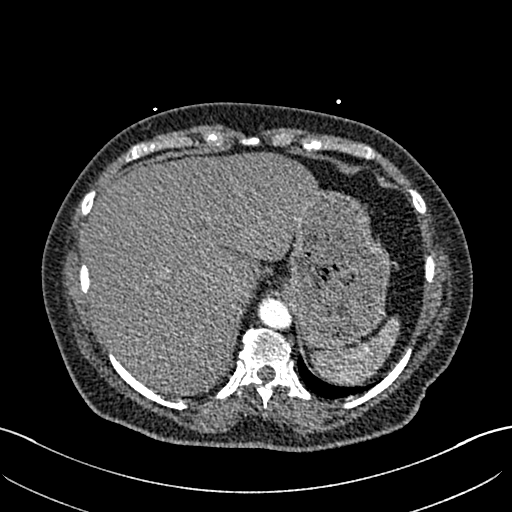
[im 82/261  lung]
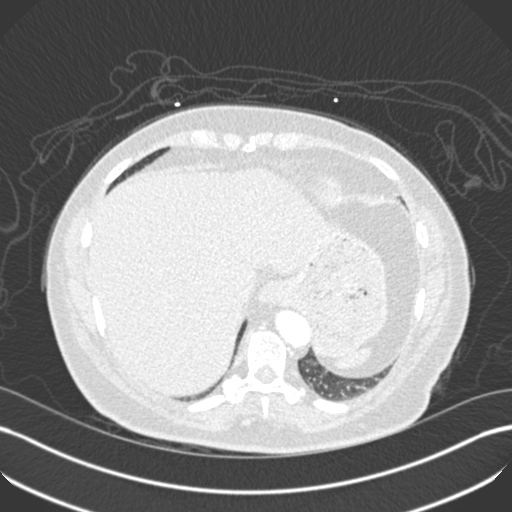
[im 98/261  soft-tissue]
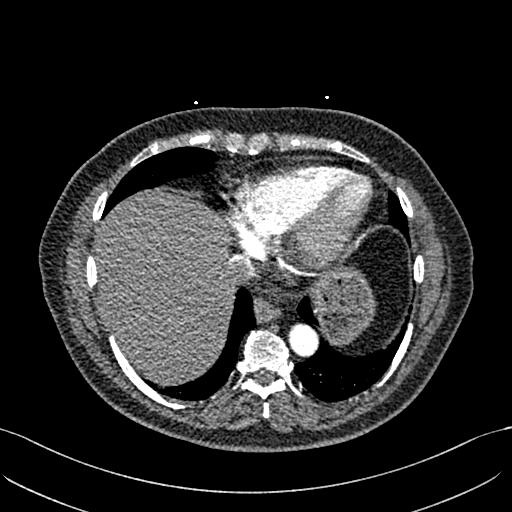
[im 114/261  lung]
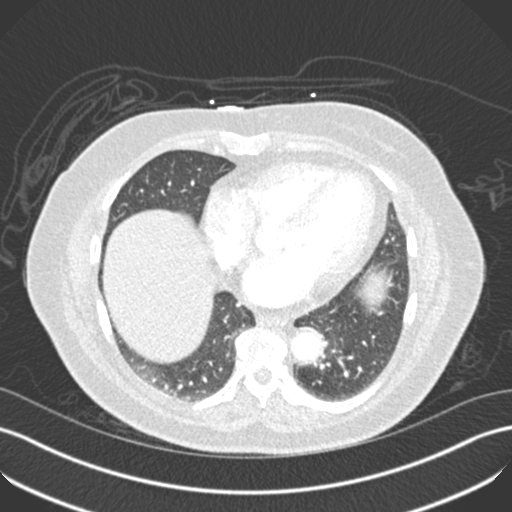
[im 131/261  soft-tissue]
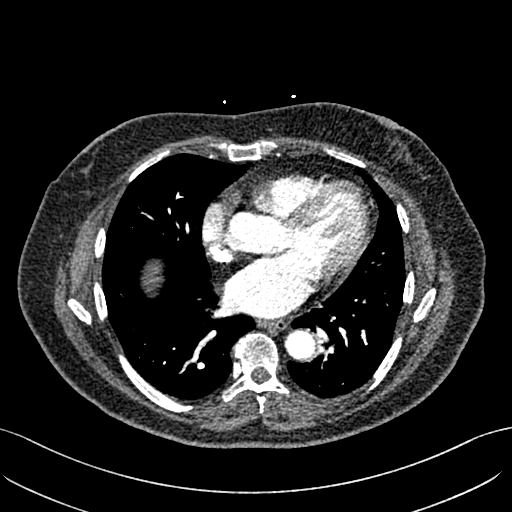
[im 147/261  lung]
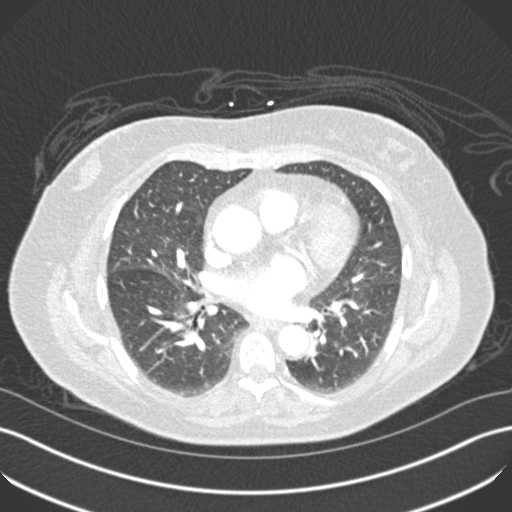
[im 163/261  soft-tissue]
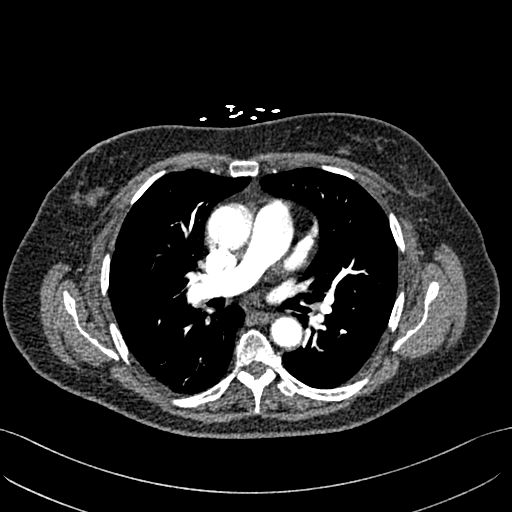
[im 179/261  lung]
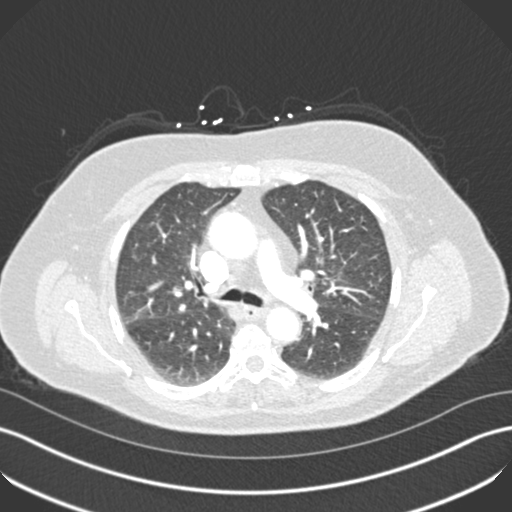
[im 196/261  soft-tissue]
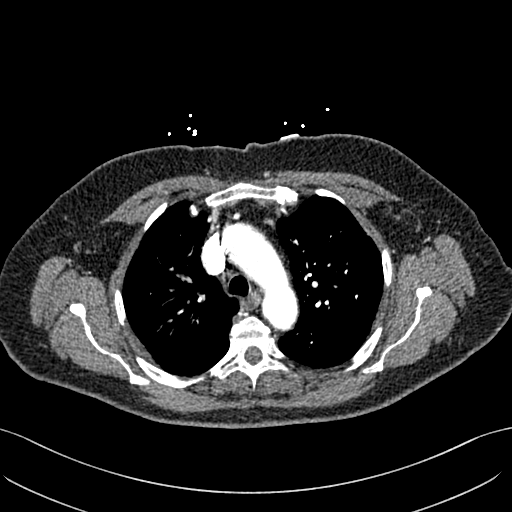
[im 212/261  lung]
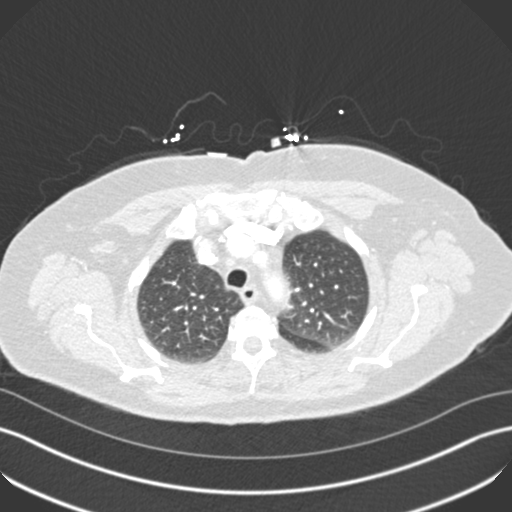
[im 228/261  soft-tissue]
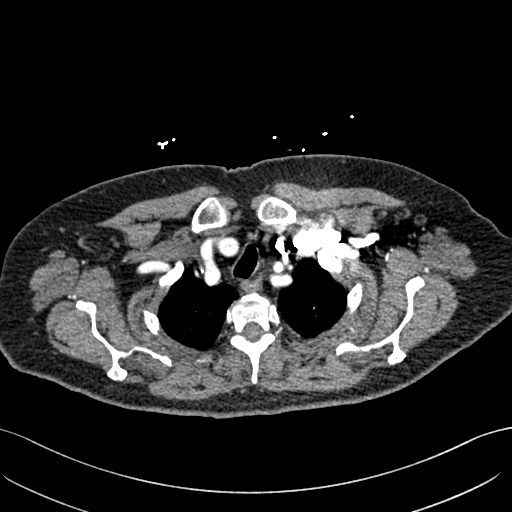
[im 244/261  lung]
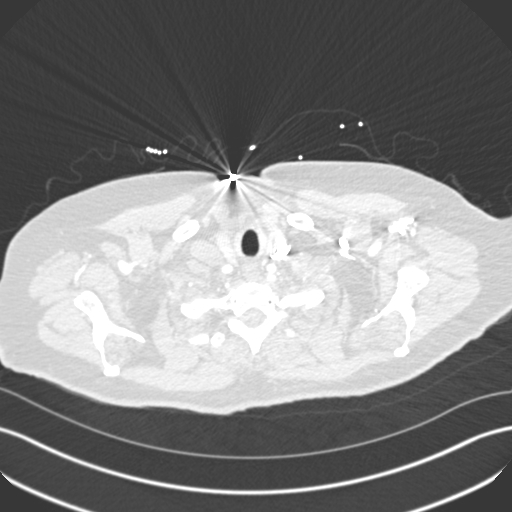

[Series 7: lung · axial · 0.76mm/px · z∈[-313,-281]mm · 2 of 131 slices shown]
[im 17/131  soft-tissue]
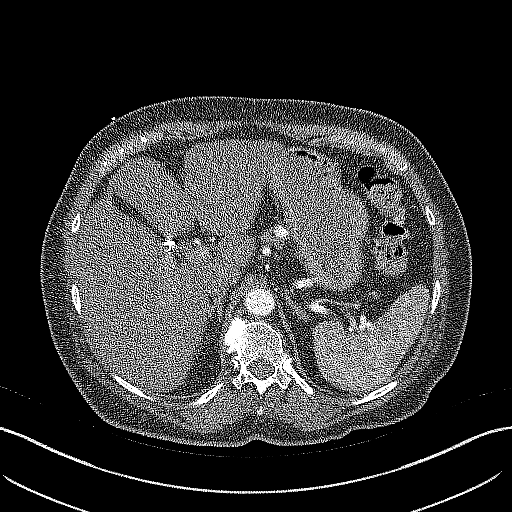
[im 33/131  soft-tissue]
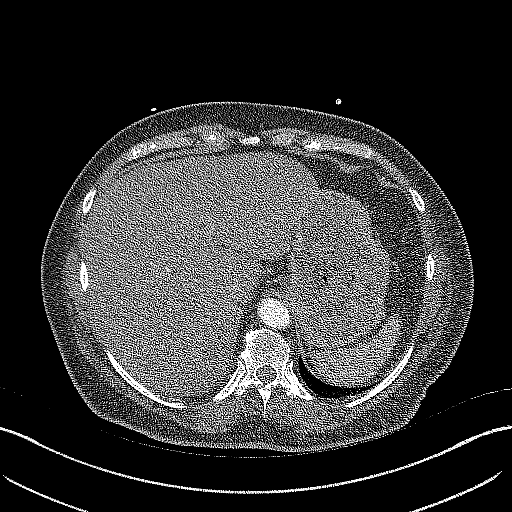

[Series 8: coronal mpr · coronal · 0.55mm/px · 2 of 100 slices shown]
[im 34/100  soft-tissue]
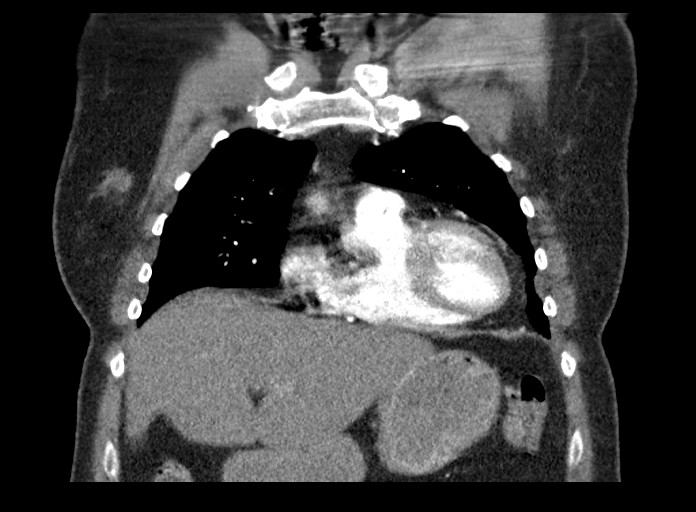
[im 67/100  soft-tissue]
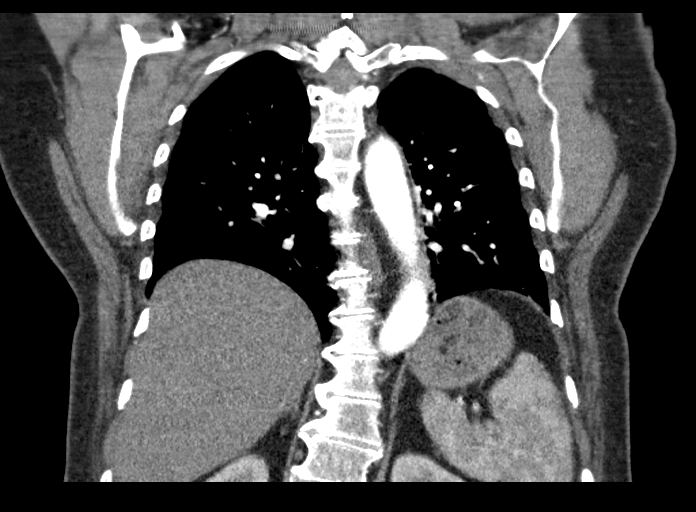

[19 of 46 positions shown; findings below may reference images not displayed]

FINDINGS: Cardiovascular: Satisfactory opacification of the pulmonary arteries
to the segmental level. No evidence of pulmonary embolism. Normal
heart size. No pericardial effusion.

Mediastinum/Nodes: No enlarged mediastinal, hilar, or axillary lymph
nodes. Thyroid gland, trachea, and esophagus demonstrate no
significant findings.

Lungs/Pleura: Lungs are clear. No pleural effusion or pneumothorax.

Upper Abdomen: No acute abnormality.

Musculoskeletal: No chest wall abnormality. No acute or significant
osseous findings.

Review of the MIP images confirms the above findings.
IMPRESSION: No definite evidence of pulmonary embolus. No definite acute
abnormality seen in the chest.

## 2022-05-29 IMAGING — DX DG CHEST 1V PORT
1 series · 1 of 1 positions shown · non-contrast
Comparison: 05/23/2008

CLINICAL DATA: Chest pain

EXAM:
PORTABLE CHEST 1 VIEW

[chest ap]
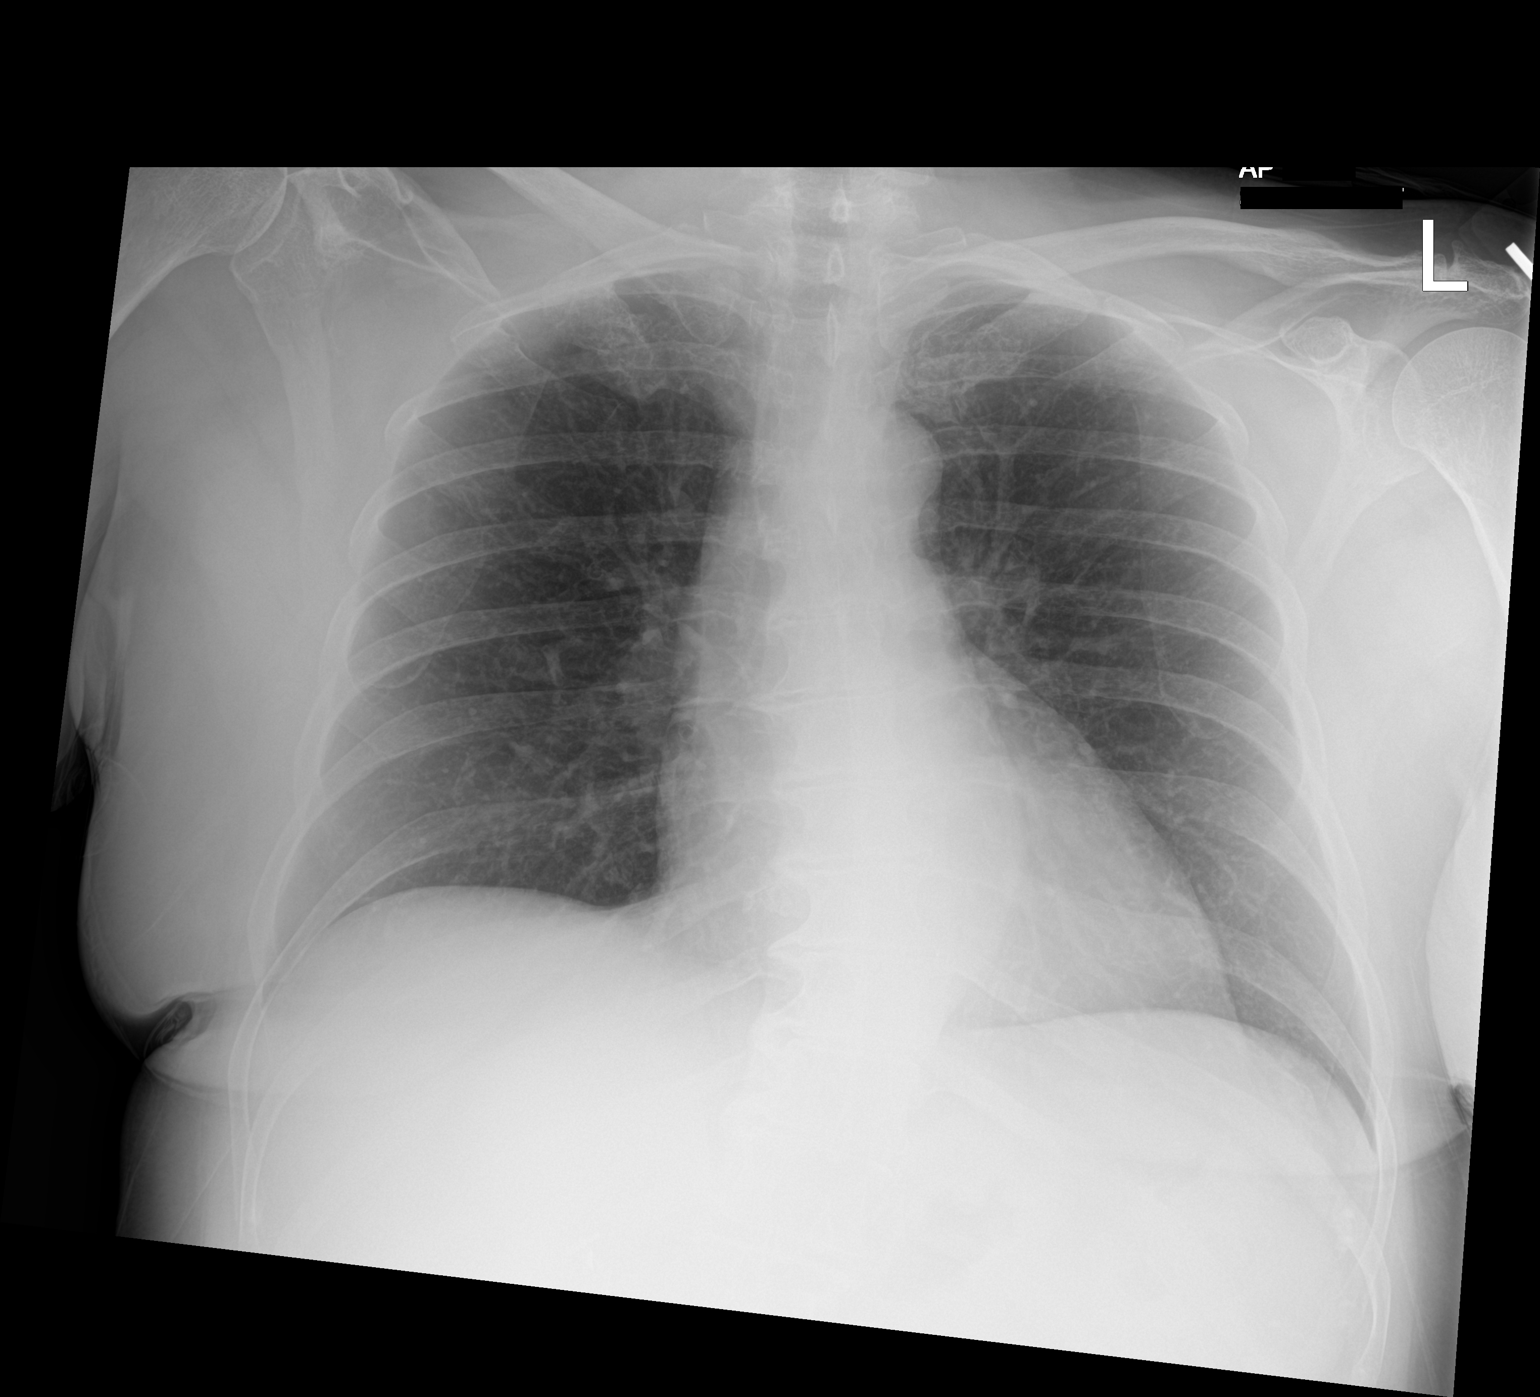

[1 of 1 positions shown; findings below may reference images not displayed]

FINDINGS: Heart and mediastinal contours are within normal limits. No focal
opacities or effusions. No acute bony abnormality.
IMPRESSION: No active disease.

## 2022-06-09 DIAGNOSIS — E782 Mixed hyperlipidemia: Secondary | ICD-10-CM | POA: Diagnosis not present

## 2022-06-09 DIAGNOSIS — E1159 Type 2 diabetes mellitus with other circulatory complications: Secondary | ICD-10-CM | POA: Diagnosis not present

## 2022-06-09 DIAGNOSIS — Z79899 Other long term (current) drug therapy: Secondary | ICD-10-CM | POA: Diagnosis not present

## 2022-06-22 DIAGNOSIS — R110 Nausea: Secondary | ICD-10-CM | POA: Diagnosis not present

## 2022-06-22 DIAGNOSIS — J029 Acute pharyngitis, unspecified: Secondary | ICD-10-CM | POA: Diagnosis not present

## 2022-06-22 DIAGNOSIS — Z03818 Encounter for observation for suspected exposure to other biological agents ruled out: Secondary | ICD-10-CM | POA: Diagnosis not present

## 2022-06-22 DIAGNOSIS — R051 Acute cough: Secondary | ICD-10-CM | POA: Diagnosis not present

## 2022-06-22 DIAGNOSIS — Z6828 Body mass index (BMI) 28.0-28.9, adult: Secondary | ICD-10-CM | POA: Diagnosis not present

## 2022-08-09 DIAGNOSIS — Z6829 Body mass index (BMI) 29.0-29.9, adult: Secondary | ICD-10-CM | POA: Diagnosis not present

## 2022-08-09 DIAGNOSIS — M5412 Radiculopathy, cervical region: Secondary | ICD-10-CM | POA: Diagnosis not present

## 2022-09-07 ENCOUNTER — Other Ambulatory Visit: Payer: Self-pay | Admitting: Family Medicine

## 2022-09-07 DIAGNOSIS — N6489 Other specified disorders of breast: Secondary | ICD-10-CM

## 2022-09-11 DIAGNOSIS — I2510 Atherosclerotic heart disease of native coronary artery without angina pectoris: Secondary | ICD-10-CM | POA: Diagnosis not present

## 2022-09-11 DIAGNOSIS — K589 Irritable bowel syndrome without diarrhea: Secondary | ICD-10-CM | POA: Diagnosis not present

## 2022-09-11 DIAGNOSIS — K219 Gastro-esophageal reflux disease without esophagitis: Secondary | ICD-10-CM | POA: Diagnosis not present

## 2022-09-11 DIAGNOSIS — E1165 Type 2 diabetes mellitus with hyperglycemia: Secondary | ICD-10-CM | POA: Diagnosis not present

## 2022-09-11 DIAGNOSIS — E782 Mixed hyperlipidemia: Secondary | ICD-10-CM | POA: Diagnosis not present

## 2022-09-11 DIAGNOSIS — G43409 Hemiplegic migraine, not intractable, without status migrainosus: Secondary | ICD-10-CM | POA: Diagnosis not present

## 2022-09-11 DIAGNOSIS — I152 Hypertension secondary to endocrine disorders: Secondary | ICD-10-CM | POA: Diagnosis not present

## 2022-09-11 DIAGNOSIS — Z6830 Body mass index (BMI) 30.0-30.9, adult: Secondary | ICD-10-CM | POA: Diagnosis not present

## 2022-09-11 DIAGNOSIS — E1159 Type 2 diabetes mellitus with other circulatory complications: Secondary | ICD-10-CM | POA: Diagnosis not present

## 2022-09-20 ENCOUNTER — Ambulatory Visit
Admission: RE | Admit: 2022-09-20 | Discharge: 2022-09-20 | Disposition: A | Discharge status: Home / Self Care | Payer: Medicare Other | Source: Ambulatory Visit | Attending: Family Medicine | Admitting: Family Medicine

## 2022-09-20 DIAGNOSIS — N6489 Other specified disorders of breast: Secondary | ICD-10-CM

## 2022-09-20 DIAGNOSIS — R92323 Mammographic fibroglandular density, bilateral breasts: Secondary | ICD-10-CM | POA: Diagnosis not present

## 2022-10-14 DIAGNOSIS — Z23 Encounter for immunization: Secondary | ICD-10-CM | POA: Diagnosis not present

## 2022-12-31 DIAGNOSIS — S43402A Unspecified sprain of left shoulder joint, initial encounter: Secondary | ICD-10-CM | POA: Diagnosis not present

## 2022-12-31 DIAGNOSIS — S339XXA Sprain of unspecified parts of lumbar spine and pelvis, initial encounter: Secondary | ICD-10-CM | POA: Diagnosis not present

## 2022-12-31 DIAGNOSIS — S0001XA Abrasion of scalp, initial encounter: Secondary | ICD-10-CM | POA: Diagnosis not present

## 2022-12-31 DIAGNOSIS — S161XXA Strain of muscle, fascia and tendon at neck level, initial encounter: Secondary | ICD-10-CM | POA: Diagnosis not present

## 2022-12-31 DIAGNOSIS — S7001XA Contusion of right hip, initial encounter: Secondary | ICD-10-CM | POA: Diagnosis not present

## 2023-01-10 DIAGNOSIS — S46092A Other injury of muscle(s) and tendon(s) of the rotator cuff of left shoulder, initial encounter: Secondary | ICD-10-CM | POA: Diagnosis not present

## 2023-01-10 DIAGNOSIS — M19012 Primary osteoarthritis, left shoulder: Secondary | ICD-10-CM | POA: Diagnosis not present

## 2023-01-12 DIAGNOSIS — S52135A Nondisplaced fracture of neck of left radius, initial encounter for closed fracture: Secondary | ICD-10-CM | POA: Diagnosis not present

## 2023-01-14 DIAGNOSIS — M25512 Pain in left shoulder: Secondary | ICD-10-CM | POA: Diagnosis not present

## 2023-01-16 DIAGNOSIS — M75122 Complete rotator cuff tear or rupture of left shoulder, not specified as traumatic: Secondary | ICD-10-CM | POA: Diagnosis not present

## 2023-01-18 DIAGNOSIS — Z85828 Personal history of other malignant neoplasm of skin: Secondary | ICD-10-CM | POA: Diagnosis not present

## 2023-01-18 DIAGNOSIS — L821 Other seborrheic keratosis: Secondary | ICD-10-CM | POA: Diagnosis not present

## 2023-01-18 DIAGNOSIS — D1801 Hemangioma of skin and subcutaneous tissue: Secondary | ICD-10-CM | POA: Diagnosis not present

## 2023-01-18 DIAGNOSIS — L570 Actinic keratosis: Secondary | ICD-10-CM | POA: Diagnosis not present

## 2023-01-18 DIAGNOSIS — C44729 Squamous cell carcinoma of skin of left lower limb, including hip: Secondary | ICD-10-CM | POA: Diagnosis not present

## 2023-01-18 DIAGNOSIS — D225 Melanocytic nevi of trunk: Secondary | ICD-10-CM | POA: Diagnosis not present

## 2023-01-18 DIAGNOSIS — C44329 Squamous cell carcinoma of skin of other parts of face: Secondary | ICD-10-CM | POA: Diagnosis not present

## 2023-01-18 DIAGNOSIS — C44719 Basal cell carcinoma of skin of left lower limb, including hip: Secondary | ICD-10-CM | POA: Diagnosis not present

## 2023-01-18 DIAGNOSIS — C44319 Basal cell carcinoma of skin of other parts of face: Secondary | ICD-10-CM | POA: Diagnosis not present

## 2023-01-25 DIAGNOSIS — S52135A Nondisplaced fracture of neck of left radius, initial encounter for closed fracture: Secondary | ICD-10-CM | POA: Diagnosis not present

## 2023-01-25 DIAGNOSIS — M25522 Pain in left elbow: Secondary | ICD-10-CM | POA: Diagnosis not present

## 2023-01-25 DIAGNOSIS — Z23 Encounter for immunization: Secondary | ICD-10-CM | POA: Diagnosis not present

## 2023-02-06 DIAGNOSIS — M25512 Pain in left shoulder: Secondary | ICD-10-CM | POA: Diagnosis not present

## 2023-02-15 DIAGNOSIS — M25512 Pain in left shoulder: Secondary | ICD-10-CM | POA: Diagnosis not present

## 2023-02-15 DIAGNOSIS — M25522 Pain in left elbow: Secondary | ICD-10-CM | POA: Diagnosis not present

## 2023-02-15 DIAGNOSIS — S52135D Nondisplaced fracture of neck of left radius, subsequent encounter for closed fracture with routine healing: Secondary | ICD-10-CM | POA: Diagnosis not present

## 2023-02-27 DIAGNOSIS — C44319 Basal cell carcinoma of skin of other parts of face: Secondary | ICD-10-CM | POA: Diagnosis not present

## 2023-02-27 DIAGNOSIS — Z85828 Personal history of other malignant neoplasm of skin: Secondary | ICD-10-CM | POA: Diagnosis not present

## 2023-03-01 DIAGNOSIS — M75122 Complete rotator cuff tear or rupture of left shoulder, not specified as traumatic: Secondary | ICD-10-CM | POA: Diagnosis not present

## 2023-03-15 DIAGNOSIS — M25552 Pain in left hip: Secondary | ICD-10-CM | POA: Diagnosis not present

## 2023-03-15 DIAGNOSIS — M25551 Pain in right hip: Secondary | ICD-10-CM | POA: Diagnosis not present

## 2023-03-15 DIAGNOSIS — M545 Low back pain, unspecified: Secondary | ICD-10-CM | POA: Diagnosis not present

## 2023-03-20 ENCOUNTER — Other Ambulatory Visit (HOSPITAL_COMMUNITY): Payer: Self-pay | Admitting: Pain Medicine

## 2023-03-20 DIAGNOSIS — E559 Vitamin D deficiency, unspecified: Secondary | ICD-10-CM | POA: Diagnosis not present

## 2023-03-20 DIAGNOSIS — E1159 Type 2 diabetes mellitus with other circulatory complications: Secondary | ICD-10-CM | POA: Diagnosis not present

## 2023-03-20 DIAGNOSIS — E782 Mixed hyperlipidemia: Secondary | ICD-10-CM | POA: Diagnosis not present

## 2023-03-20 DIAGNOSIS — Z6829 Body mass index (BMI) 29.0-29.9, adult: Secondary | ICD-10-CM | POA: Diagnosis not present

## 2023-03-20 DIAGNOSIS — M1712 Unilateral primary osteoarthritis, left knee: Secondary | ICD-10-CM | POA: Diagnosis not present

## 2023-03-20 DIAGNOSIS — I10 Essential (primary) hypertension: Secondary | ICD-10-CM | POA: Diagnosis not present

## 2023-03-20 DIAGNOSIS — K219 Gastro-esophageal reflux disease without esophagitis: Secondary | ICD-10-CM | POA: Diagnosis not present

## 2023-03-20 DIAGNOSIS — Z23 Encounter for immunization: Secondary | ICD-10-CM | POA: Diagnosis not present

## 2023-03-24 DIAGNOSIS — M533 Sacrococcygeal disorders, not elsewhere classified: Secondary | ICD-10-CM | POA: Diagnosis not present

## 2023-03-29 ENCOUNTER — Ambulatory Visit (HOSPITAL_COMMUNITY)
Admission: RE | Admit: 2023-03-29 | Discharge: 2023-03-29 | Disposition: A | Discharge status: Home / Self Care | Payer: Medicare Other | Source: Ambulatory Visit | Attending: Pain Medicine | Admitting: Pain Medicine

## 2023-03-29 DIAGNOSIS — E1159 Type 2 diabetes mellitus with other circulatory complications: Secondary | ICD-10-CM | POA: Insufficient documentation

## 2023-03-29 DIAGNOSIS — E782 Mixed hyperlipidemia: Secondary | ICD-10-CM | POA: Insufficient documentation

## 2023-04-17 DIAGNOSIS — E119 Type 2 diabetes mellitus without complications: Secondary | ICD-10-CM | POA: Diagnosis not present

## 2023-05-01 DIAGNOSIS — Z20818 Contact with and (suspected) exposure to other bacterial communicable diseases: Secondary | ICD-10-CM | POA: Diagnosis not present

## 2023-05-01 DIAGNOSIS — Z20822 Contact with and (suspected) exposure to covid-19: Secondary | ICD-10-CM | POA: Diagnosis not present

## 2023-05-01 DIAGNOSIS — Z03818 Encounter for observation for suspected exposure to other biological agents ruled out: Secondary | ICD-10-CM | POA: Diagnosis not present

## 2023-05-01 DIAGNOSIS — R059 Cough, unspecified: Secondary | ICD-10-CM | POA: Diagnosis not present

## 2023-05-01 DIAGNOSIS — J029 Acute pharyngitis, unspecified: Secondary | ICD-10-CM | POA: Diagnosis not present

## 2023-05-10 NOTE — H&P (Signed)
Patient's anticipated LOS is less than 2 midnights, meeting these requirements: - Younger than 70 - Lives within 1 hour of care - Has a competent adult at home to recover with post-op recover - NO history of  - Chronic pain requiring opiods  - Diabetes  - Coronary Artery Disease  - Heart failure  - Heart attack  - Stroke  - DVT/VTE  - Cardiac arrhythmia  - Respiratory Failure/COPD  - Renal failure  - Anemia  - Advanced Liver disease     Karen Scott is an 67 y.o. female.    Chief Complaint: left shoulder pain  HPI: Pt is a 67 y.o. female complaining of left shoulder pain for multiple years. Pain had continually increased since the beginning. X-rays in the clinic show end-stage arthritic changes of the left shoulder. Pt has tried various conservative treatments which have failed to alleviate their symptoms, including injections and therapy. Various options are discussed with the patient. Risks, benefits and expectations were discussed with the patient. Patient understand the risks, benefits and expectations and wishes to proceed with surgery.   PCP:  Gweneth Dimitri, MD  D/C Plans: Home  PMH: Past Medical History:  Diagnosis Date   Abdominal wall hernia    Adenomatous colon polyp    Anxiety    Arthritis    CAD (coronary artery disease)    Carpal tunnel syndrome    Clotting disorder (HCC)    Depression    Diabetes mellitus without complication (HCC)    Diverticulosis    DVT (deep venous thrombosis) (HCC)    DVT (deep venous thrombosis) (HCC)    L leg   Esophageal stricture    GERD (gastroesophageal reflux disease)    Hepatic steatosis    Hyperlipidemia    Hypertension    Internal hemorrhoids    Migraines    Ovarian cyst    Pneumonia    Shingles    Tubular adenoma of colon    Ventral hernia     PSH: Past Surgical History:  Procedure Laterality Date   ANKLE SURGERY Left    Ligament repair   BREAST SURGERY     biopsy   CARDIAC CATHETERIZATION      CARPAL TUNNEL RELEASE Bilateral    CHOLECYSTECTOMY     JOINT REPLACEMENT  05/29/2006   ROTATOR CUFF REPAIR Right    TOTAL KNEE ARTHROPLASTY Left 11/07/2021   Procedure: Left total knee arthroplasty; removal lipoma left thigh;  Surgeon: Ollen Gross, MD;  Location: [facility];  Service: Orthopedics;  Laterality: Left;   TRIGGER FINGER RELEASE      Social History:  reports that she has never smoked. She has never used smokeless tobacco. She reports that she does not drink alcohol and does not use drugs. BMI: Estimated body mass index is 26.15 kg/m as calculated from the following:   Height as of 11/07/21: 5' 3.5" (1.613 m).   Weight as of 11/07/21: 68 kg.  Lab Results  Component Value Date   ALBUMIN 3.9 10/26/2021   Diabetes:   Patient has a diagnosis of diabetes,  Lab Results  Component Value Date   HGBA1C 7.6 (H) 10/26/2021   Smoking Status:   reports that she has never smoked. She has never used smokeless tobacco.    Allergies:  Allergies  Allergen Reactions   Penicillins Anaphylaxis    All "myocins" : unknown Has patient had a PCN reaction causing immediate rash, facial/tongue/throat swelling, SOB or lightheadedness with hypotension: n/a Has patient had a PCN  reaction causing severe rash involving mucus membranes or skin necrosis: n/a Has patient had a PCN reaction that required hospitalization: n/a Has patient had a PCN reaction occurring within the last 10 years: n/a If all of the above answers are "NO", then may proceed with Cephalosporin use.    Erythromycin Other (See Comments)    All in this class, was a child   Tramadol Itching    Medications: No current facility-administered medications for this encounter.   Current Outpatient Medications  Medication Sig Dispense Refill   acetaminophen (TYLENOL) 500 MG tablet Take 1,000 mg by mouth every 6 (six) hours as needed for mild pain.     atorvastatin (LIPITOR) 20 MG tablet Take 20 mg by mouth every evening.       dicyclomine (BENTYL) 20 MG tablet Take 20 mg by mouth daily as needed for spasms.     FLUoxetine (PROZAC) 20 MG capsule Take 3 capsules (60 mg total) by mouth daily. (Patient taking differently: Take 40 mg by mouth daily.) 270 capsule 3   gabapentin (NEURONTIN) 300 MG capsule Take a 300 mg capsule three times a day for two weeks following surgery.Then take a 300 mg capsule two times a day for two weeks. Then take a 300 mg capsule once a day for two weeks. Then discontinue. 84 capsule 0   HYDROmorphone (DILAUDID) 2 MG tablet Take 1-2 tablets (2-4 mg total) by mouth every 6 (six) hours as needed for severe pain. 42 tablet 0   losartan-hydrochlorothiazide (HYZAAR) 100-25 MG per tablet Take 1 tablet by mouth every morning.      mesalamine (APRISO) 0.375 g 24 hr capsule Take 4 capsules (1.5 g total) by mouth daily. (Patient taking differently: Take 1,500 mg by mouth daily as needed (IBS).) 120 capsule 2   metFORMIN (GLUCOPHAGE-XR) 500 MG 24 hr tablet Take 500 mg by mouth 2 (two) times daily.     ondansetron (ZOFRAN-ODT) 4 MG disintegrating tablet Take 4 mg by mouth every 8 (eight) hours as needed for nausea or vomiting.     pantoprazole (PROTONIX) 40 MG tablet Take 40 mg by mouth 2 (two) times daily.     rizatriptan (MAXALT-MLT) 10 MG disintegrating tablet Take 1 tablet (10 mg total) by mouth as needed for migraine. May repeat in 2 hours if needed (Patient taking differently: Take 10 mg by mouth daily as needed for migraine. May repeat in 2 hours if needed) 9 tablet 11    No results found for this or any previous visit (from the past 48 hours). No results found.  ROS: Pain with rom of the left upper extremity  Physical Exam: Alert and oriented 67 y.o. female in no acute distress Cranial nerves 2-12 intact Cervical spine: full rom with no tenderness, nv intact distally Chest: active breath sounds bilaterally, no wheeze rhonchi or rales Heart: regular rate and rhythm, no murmur Abd: non tender non  distended with active bowel sounds Hip is stable with rom  Left shoulder painful and weak rom Nv intact distally No rashes or edema distally   Assessment/Plan Assessment: left shoulder cuff arthropathy  Plan:  Patient will undergo a left reverse total shoulder by Dr. Ranell Patrick at [place] Risks benefits and expectations were discussed with the patient. Patient understand risks, benefits and expectations and wishes to proceed. Preoperative templating of the joint replacement has been completed, documented, and submitted to the Operating Room personnel in order to optimize intra-operative equipment management.   Brad Amyiah Gaba PA-C, MPAS [employer] is now  [facility]  [place] [address], [address], [place], [place] [address] Phone: [phone] [email or web address] Facebook  [employer]

## 2023-05-15 NOTE — Progress Notes (Addendum)
Anesthesia Review:  PCP: Gweneth Dimitri  Cardiologist : none  Chest x-ray : EKG : 05/21/23 Echo : 2022  CT Card- 04/21/23  Stress test: Cardiac Cath :  Activity level: can do a flight of stairs without difficulty  Sleep Study/ CPAP : none Fasting Blood Sugar :      / Checks Blood Sugar -- times a day:   Blood Thinner/ Instructions /Last Dose: ASA / Instructions/ Last Dose :    DM- 2- checks gluocse 3-4 times per week  Hgba1c- 05/21/2023 - 9.2-routed to Dr Dietrich Pates on 05/25/23.  Metformin- none day of surgery    05/01/23- OV for Sore throat- No other problems per pt at time of preop viist on 05/21/23.

## 2023-05-16 NOTE — Patient Instructions (Signed)
SURGICAL WAITING ROOM VISITATION  Patients having surgery or a procedure may have no more than 2 support people in the waiting area - these visitors may rotate.    Children under the age of 56 must have an adult with them who is not the patient.  Due to an increase in RSV and influenza rates and associated hospitalizations, children ages 96 and under may not visit patients in [facility] hospitals.  If the patient needs to stay at the hospital during part of their recovery, the visitor guidelines for inpatient rooms apply. Pre-op nurse will coordinate an appropriate time for 1 support person to accompany patient in pre-op.  This support person may not rotate.    Please refer to the [facility] website for the visitor guidelines for Inpatients (after your surgery is over and you are in a regular room).       Your procedure is scheduled on:  06/01/2023    Report to [facility] Main Entrance    Report to admitting at   [phone]   Call this number if you have problems the morning of surgery [phone]   Do not eat food :After Midnight.   After Midnight you may have the following liquids until __ 0415____ AM DAY OF SURGERY  Water Non-Citrus Juices (without pulp, NO RED-Apple, White grape, White cranberry) Black Coffee (NO MILK/CREAM OR CREAMERS, sugar ok)  Clear Tea (NO MILK/CREAM OR CREAMERS, sugar ok) regular and decaf                             Plain Jell-O (NO RED)                                           Fruit ices (not with fruit pulp, NO RED)                                     Popsicles (NO RED)                                                               Sports drinks like Gatorade (NO RED)              The day of surgery:  Drink ONE (1) Pre-Surgery Clear Ensure or G2 at  0415AM ( have completed by ) the morning of surgery. Drink in one sitting. Do not sip.  This drink was given to you during your hospital  pre-op appointment visit. Nothing else to drink after  completing the  Pre-Surgery Clear Ensure or G2.          If you have questions, please contact your surgeon's office.      Oral Hygiene is also important to reduce your risk of infection.                                    Remember - BRUSH YOUR TEETH THE MORNING OF SURGERY WITH YOUR REGULAR TOOTHPASTE  DENTURES WILL BE REMOVED PRIOR  TO SURGERY PLEASE DO NOT APPLY "Poly grip" OR ADHESIVES!!!   Do NOT smoke after Midnight   Stop all vitamins and herbal supplements 7 days before surgery.   Take these medicines the morning of surgery with A SIP OF WATER:  prozac, protonix   DO NOT TAKE ANY ORAL DIABETIC MEDICATIONS DAY OF YOUR SURGERY  Bring CPAP mask and tubing day of surgery.                              You may not have any metal on your body including hair pins, jewelry, and body piercing             Do not wear make-up, lotions, powders, perfumes/cologne, or deodorant  Do not wear nail polish including gel and S&S, artificial/acrylic nails, or any other type of covering on natural nails including finger and toenails. If you have artificial nails, gel coating, etc. that needs to be removed by a nail salon please have this removed prior to surgery or surgery may need to be canceled/ delayed if the surgeon/ anesthesia feels like they are unable to be safely monitored.   Do not shave  48 hours prior to surgery.               Men may shave face and neck.   Do not bring valuables to the hospital.  IS NOT             RESPONSIBLE   FOR VALUABLES.   Contacts, glasses, dentures or bridgework may not be worn into surgery.   Bring small overnight bag day of surgery.   DO NOT BRING YOUR HOME MEDICATIONS TO THE HOSPITAL. PHARMACY WILL DISPENSE MEDICATIONS LISTED ON YOUR MEDICATION LIST TO YOU DURING YOUR ADMISSION IN THE HOSPITAL!    Patients discharged on the day of surgery will not be allowed to drive home.  Someone NEEDS to stay with you for the first 24 hours after  anesthesia.   Special Instructions: Bring a copy of your healthcare power of attorney and living will documents the day of surgery if you haven't scanned them before.              Please read over the following fact sheets you were given: IF YOU HAVE QUESTIONS ABOUT YOUR PRE-OP INSTRUCTIONS PLEASE CALL [phone]   If you received a COVID test during your pre-op visit  it is requested that you wear a mask when out in public, stay away from anyone that may not be feeling well and notify your surgeon if you develop symptoms. If you test positive for Covid or have been in contact with anyone that has tested positive in the last 10 days please notify you surgeon.      Pre-operative 5 CHG Bath Instructions   You can play a key role in reducing the risk of infection after surgery. Your skin needs to be as free of germs as possible. You can reduce the number of germs on your skin by washing with CHG (chlorhexidine gluconate) soap before surgery. CHG is an antiseptic soap that kills germs and continues to kill germs even after washing.   DO NOT use if you have an allergy to chlorhexidine/CHG or antibacterial soaps. If your skin becomes reddened or irritated, stop using the CHG and notify one of our RNs at [phone].   Please shower with the CHG soap starting 4 days before surgery using the following schedule:  Please keep in mind the following:  DO NOT shave, including legs and underarms, starting the day of your first shower.   You may shave your face at any point before/day of surgery.  Place clean sheets on your bed the day you start using CHG soap. Use a clean washcloth (not used since being washed) for each shower. DO NOT sleep with pets once you start using the CHG.   CHG Shower Instructions:  If you choose to wash your hair and private area, wash first with your normal shampoo/soap.  After you use shampoo/soap, rinse your hair and body thoroughly to remove shampoo/soap residue.   Turn the water OFF and apply about 3 tablespoons (45 ml) of CHG soap to a CLEAN washcloth.  Apply CHG soap ONLY FROM YOUR NECK DOWN TO YOUR TOES (washing for 3-5 minutes)  DO NOT use CHG soap on face, private areas, open wounds, or sores.  Pay special attention to the area where your surgery is being performed.  If you are having back surgery, having someone wash your back for you may be helpful. Wait 2 minutes after CHG soap is applied, then you may rinse off the CHG soap.  Pat dry with a clean towel  Put on clean clothes/pajamas   If you choose to wear lotion, please use ONLY the CHG-compatible lotions on the back of this paper.     Additional instructions for the day of surgery: DO NOT APPLY any lotions, deodorants, cologne, or perfumes.   Put on clean/comfortable clothes.  Brush your teeth.  Ask your nurse before applying any prescription medications to the skin.      CHG Compatible Lotions   Aveeno Moisturizing lotion  Cetaphil Moisturizing Cream  Cetaphil Moisturizing Lotion  Clairol Herbal Essence Moisturizing Lotion, Dry Skin  Clairol Herbal Essence Moisturizing Lotion, Extra Dry Skin  Clairol Herbal Essence Moisturizing Lotion, Normal Skin  Curel Age Defying Therapeutic Moisturizing Lotion with Alpha Hydroxy  Curel Extreme Care Body Lotion  Curel Soothing Hands Moisturizing Hand Lotion  Curel Therapeutic Moisturizing Cream, Fragrance-Free  Curel Therapeutic Moisturizing Lotion, Fragrance-Free  Curel Therapeutic Moisturizing Lotion, Original Formula  Eucerin Daily Replenishing Lotion  Eucerin Dry Skin Therapy Plus Alpha Hydroxy Crme  Eucerin Dry Skin Therapy Plus Alpha Hydroxy Lotion  Eucerin Original Crme  Eucerin Original Lotion  Eucerin Plus Crme Eucerin Plus Lotion  Eucerin TriLipid Replenishing Lotion  Keri Anti-Bacterial Hand Lotion  Keri Deep Conditioning Original Lotion Dry Skin Formula Softly Scented  Keri Deep Conditioning Original Lotion, Fragrance  Free Sensitive Skin Formula  Keri Lotion Fast Absorbing Fragrance Free Sensitive Skin Formula  Keri Lotion Fast Absorbing Softly Scented Dry Skin Formula  Keri Original Lotion  Keri Skin Renewal Lotion Keri Silky Smooth Lotion  Keri Silky Smooth Sensitive Skin Lotion  Nivea Body Creamy Conditioning Oil  Nivea Body Extra Enriched Lotion  Nivea Body Original Lotion  Nivea Body Sheer Moisturizing Lotion Nivea Crme  Nivea Skin Firming Lotion  NutraDerm 30 Skin Lotion  NutraDerm Skin Lotion  NutraDerm Therapeutic Skin Cream  NutraDerm Therapeutic Skin Lotion  ProShield Protective Hand Cream  Provon moisturizing lotion   [place]- Preparing for Total Shoulder Arthroplasty    Before surgery, you can play an important role. Because skin is not sterile, your skin needs to be as free of germs as possible. You can reduce the number of germs on your skin by using the following products. Benzoyl Peroxide Gel Reduces the number of germs present on the skin Applied twice  a day to shoulder area starting two days before surgery    ==================================================================  Please follow these instructions carefully:  BENZOYL PEROXIDE 5% GEL  Please do not use if you have an allergy to benzoyl peroxide.   If your skin becomes reddened/irritated stop using the benzoyl peroxide.  Starting two days before surgery, apply as follows: Apply benzoyl peroxide in the morning and at night. Apply after taking a shower. If you are not taking a shower clean entire shoulder front, back, and side along with the armpit with a clean wet washcloth.  Place a quarter-sized dollop on your shoulder and rub in thoroughly, making sure to cover the front, back, and side of your shoulder, along with the armpit.   2 days before ____ AM   ____ PM              1 day before ____ AM   ____ PM                         Do this twice a day for two days.  (Last application is the night before surgery,  AFTER using the CHG soap as described below).  Do NOT apply benzoyl peroxide gel on the day of surgery.

## 2023-05-21 ENCOUNTER — Other Ambulatory Visit: Payer: Self-pay

## 2023-05-21 ENCOUNTER — Encounter (HOSPITAL_COMMUNITY)
Admission: RE | Admit: 2023-05-21 | Discharge: 2023-05-21 | Disposition: A | Discharge status: Home / Self Care | Payer: Medicare Other | Source: Ambulatory Visit | Attending: Orthopedic Surgery | Admitting: Orthopedic Surgery

## 2023-05-21 ENCOUNTER — Encounter (HOSPITAL_COMMUNITY): Payer: Self-pay

## 2023-05-21 VITALS — BP 140/99 | HR 90 | Temp 98.2°F | Resp 16 | Ht 62.0 in | Wt 152.0 lb

## 2023-05-21 DIAGNOSIS — Z01818 Encounter for other preprocedural examination: Secondary | ICD-10-CM | POA: Insufficient documentation

## 2023-05-21 DIAGNOSIS — E139 Other specified diabetes mellitus without complications: Secondary | ICD-10-CM | POA: Diagnosis not present

## 2023-05-21 HISTORY — DX: Other specified postprocedural states: Z98.890

## 2023-05-21 LAB — BASIC METABOLIC PANEL
Anion gap: 11 (ref 5–15)
BUN: 16 mg/dL (ref 8–23)
CO2: 26 mmol/L (ref 22–32)
Calcium: 9.2 mg/dL (ref 8.9–10.3)
Chloride: 97 mmol/L — ABNORMAL LOW (ref 98–111)
Creatinine, Ser: 0.84 mg/dL (ref 0.44–1.00)
GFR, Estimated: >60 mL/min (ref >60)
Glucose, Bld: 245 mg/dL — ABNORMAL HIGH (ref 70–99)
Potassium: 3.5 mmol/L (ref 3.5–5.1)
Sodium: 134 mmol/L — ABNORMAL LOW (ref 135–145)

## 2023-05-21 LAB — CBC
HCT: 36.7 % (ref 36.0–46.0)
Hemoglobin: 12.1 g/dL (ref 12.0–15.0)
MCH: 26.8 pg (ref 26.0–34.0)
MCHC: 33 g/dL (ref 30.0–36.0)
MCV: 81.2 fL (ref 80.0–100.0)
Platelets: 241 10*3/uL (ref 150–400)
RBC: 4.52 MIL/uL (ref 3.87–5.11)
RDW: 15.2 % (ref 11.5–15.5)
WBC: 8.7 10*3/uL (ref 4.0–10.5)
nRBC: 0 % (ref 0.0–0.2)

## 2023-05-21 LAB — SURGICAL PCR SCREEN
MRSA, PCR: NEGATIVE
Staphylococcus aureus: NEGATIVE

## 2023-05-21 LAB — HEMOGLOBIN A1C
Hgb A1c MFr Bld: 9.2 % — ABNORMAL HIGH (ref 4.8–5.6)
Mean Plasma Glucose: 217.34 mg/dL

## 2023-05-21 LAB — GLUCOSE, CAPILLARY: Glucose-Capillary: 233 mg/dL — ABNORMAL HIGH (ref 70–99)

## 2023-05-24 NOTE — Progress Notes (Signed)
DISCUSSION: Karen Scott is a 67 yo female who presents to PAT prior to left REVERSE SHOULDER ARTHROPLASTY on 06/01/2023 with Dr. Ranell Patrick. PMH of HTN, CAD (by cath in 2010), migraines, GERD, DM,   Prior anesthesia complications include PONV  Patient had a LHC in 2010 due to chest pain. Cath at that time showed mild CAD and medical therapy recommended. No PCI documented. She did not follow up with Cardiology after that. She last saw Cardiology in 2022 for atypical sounding chest pain after an ED visit for the same. Her ED w/u was unremarkable but Cardiology was consulted and they advised outpatient f/u. She was seen on 11/09/2020 and advised to continue risk factor modification and f/u in 1 year.   Patient follows with PCP. Last seen on 03/20/23. BP controlled. DM not well controlled (A1c 9.2 on PAT labs). Dr. Dietrich Pates office made aware  VS: BP (!) 140/99   Pulse 90   Temp 36.8 C (Oral)   Resp 16   Ht 5\' 2"  (1.575 m)   Wt 68.9 kg   BMI 27.80 kg/m   PROVIDERS: Gweneth Dimitri, MD   LABS:  A1c reviewed with Dr. Dietrich Pates office (all labs ordered are listed, but only abnormal results are displayed)  Labs Reviewed  BASIC METABOLIC PANEL - Abnormal; Notable for the following components:      Result Value   Sodium 134 (*)    Chloride 97 (*)    Glucose, Bld 245 (*)    All other components within normal limits  HEMOGLOBIN A1C - Abnormal; Notable for the following components:   Hgb A1c MFr Bld 9.2 (*)    All other components within normal limits  GLUCOSE, CAPILLARY - Abnormal; Notable for the following components:   Glucose-Capillary 233 (*)    All other components within normal limits  SURGICAL PCR SCREEN  CBC     IMAGES:   EKG:   CV:  Echo 10/17/2020:  IMPRESSIONS     1. Left ventricular ejection fraction, by estimation, is 60 to 65%. The  left ventricle has normal function. The left ventricle has no regional  wall motion abnormalities. Left ventricular diastolic parameters  were  normal.   2. Right ventricular systolic function is normal. The right ventricular  size is normal. There is normal pulmonary artery systolic pressure.   3. The mitral valve is grossly normal. trivial to mild mitral valve  regurgitation.   4. The aortic valve is tricuspid. Aortic valve regurgitation is not  visualized.   5. The inferior vena cava is normal in size with greater than 50%  respiratory variability, suggesting right atrial pressure of 3 mmHg.   Comparison(s): No prior Echocardiogram.   Conclusion(s)/Recommendation(s): Otherwise normal echocardiogram, with  minor abnormalities described in the report.  LHC 05/06/2009:  IMPRESSION:  1. Normal left ventricular function.  2. A 50% ostial narrowing of the first diagonal branch of the left      anterior descending with evidence for 50-80% mid left anterior      descending narrowing with spasm/muscle bridging, which normalized      in this mid left anterior descending segment following IC      nitroglycerin administration without change in the diagonal ostial      lesion.  3. Normal circumflex.  4. Normal right coronary artery.    RECOMMENDATIONS:  Medical therapy.  Due to the patient's hypertensive  history and spasm, amlodipine will be added to her medical regimen and  she will be given a prescription  for sublingual nitroglycerin.  Aggressive lipid intervention will be necessary with increasing her HDL  cholesterol.  Past Medical History:  Diagnosis Date   Abdominal wall hernia    Adenomatous colon polyp    Arthritis    Carpal tunnel syndrome    Clotting disorder (HCC)    Diabetes mellitus without complication (HCC)    Diverticulosis    DVT (deep venous thrombosis) (HCC)    DVT (deep venous thrombosis) (HCC)    L leg   Esophageal stricture    GERD (gastroesophageal reflux disease)    Hepatic steatosis    Hyperlipidemia    Hypertension    Internal hemorrhoids    Ovarian cyst    PONV (postoperative  nausea and vomiting)    Shingles    Tubular adenoma of colon    Ventral hernia     Past Surgical History:  Procedure Laterality Date   ANKLE SURGERY Left    Ligament repair   BREAST SURGERY     biopsy   CARDIAC CATHETERIZATION     CARPAL TUNNEL RELEASE Bilateral    CHOLECYSTECTOMY     JOINT REPLACEMENT  05/29/2006   right knee replacement      ROTATOR CUFF REPAIR Right    TOTAL KNEE ARTHROPLASTY Left 11/07/2021   Procedure: Left total knee arthroplasty; removal lipoma left thigh;  Surgeon: Ollen Gross, MD;  Location: [facility];  Service: Orthopedics;  Laterality: Left;   TRIGGER FINGER RELEASE      MEDICATIONS:  acetaminophen (TYLENOL) 500 MG tablet   atorvastatin (LIPITOR) 20 MG tablet   dicyclomine (BENTYL) 20 MG tablet   FLUoxetine (PROZAC) 20 MG capsule   gabapentin (NEURONTIN) 300 MG capsule   HYDROmorphone (DILAUDID) 2 MG tablet   losartan-hydrochlorothiazide (HYZAAR) 100-25 MG per tablet   mesalamine (APRISO) 0.375 g 24 hr capsule   metFORMIN (GLUCOPHAGE-XR) 500 MG 24 hr tablet   ondansetron (ZOFRAN-ODT) 4 MG disintegrating tablet   pantoprazole (PROTONIX) 40 MG tablet   rizatriptan (MAXALT-MLT) 10 MG disintegrating tablet   No current facility-administered medications for this encounter.   Marcille Blanco [facility] Surgical Short Stay/Anesthesiology [facility] Phone [phone] 05/28/2023 8:49 AM

## 2023-06-01 ENCOUNTER — Ambulatory Visit (HOSPITAL_COMMUNITY): Admission: RE | Admit: 2023-06-01 | Payer: Medicare Other | Source: Home / Self Care | Admitting: Orthopedic Surgery

## 2023-06-01 ENCOUNTER — Encounter (HOSPITAL_COMMUNITY): Admission: RE | Payer: Self-pay | Source: Home / Self Care

## 2023-06-01 DIAGNOSIS — E785 Hyperlipidemia, unspecified: Secondary | ICD-10-CM

## 2023-06-01 DIAGNOSIS — E1169 Type 2 diabetes mellitus with other specified complication: Secondary | ICD-10-CM

## 2023-06-01 SURGERY — ARTHROPLASTY, SHOULDER, TOTAL, REVERSE
Anesthesia: General | Site: Shoulder | Laterality: Left

## 2023-06-12 DIAGNOSIS — H25811 Combined forms of age-related cataract, right eye: Secondary | ICD-10-CM | POA: Diagnosis not present

## 2023-06-12 DIAGNOSIS — E119 Type 2 diabetes mellitus without complications: Secondary | ICD-10-CM | POA: Diagnosis not present

## 2023-06-15 DIAGNOSIS — H25811 Combined forms of age-related cataract, right eye: Secondary | ICD-10-CM | POA: Diagnosis not present

## 2023-06-27 DIAGNOSIS — H2511 Age-related nuclear cataract, right eye: Secondary | ICD-10-CM | POA: Diagnosis not present

## 2023-06-29 DIAGNOSIS — H52222 Regular astigmatism, left eye: Secondary | ICD-10-CM | POA: Diagnosis not present

## 2023-06-29 DIAGNOSIS — H25812 Combined forms of age-related cataract, left eye: Secondary | ICD-10-CM | POA: Diagnosis not present

## 2023-07-06 DIAGNOSIS — Z6827 Body mass index (BMI) 27.0-27.9, adult: Secondary | ICD-10-CM | POA: Diagnosis not present

## 2023-07-06 DIAGNOSIS — Z01818 Encounter for other preprocedural examination: Secondary | ICD-10-CM | POA: Diagnosis not present

## 2023-07-06 DIAGNOSIS — E1159 Type 2 diabetes mellitus with other circulatory complications: Secondary | ICD-10-CM | POA: Diagnosis not present

## 2023-08-14 DIAGNOSIS — B3731 Acute candidiasis of vulva and vagina: Secondary | ICD-10-CM | POA: Diagnosis not present

## 2023-08-14 DIAGNOSIS — B370 Candidal stomatitis: Secondary | ICD-10-CM | POA: Diagnosis not present

## 2023-08-14 DIAGNOSIS — Z6827 Body mass index (BMI) 27.0-27.9, adult: Secondary | ICD-10-CM | POA: Diagnosis not present

## 2023-08-14 DIAGNOSIS — E1165 Type 2 diabetes mellitus with hyperglycemia: Secondary | ICD-10-CM | POA: Diagnosis not present

## 2023-08-27 DIAGNOSIS — E782 Mixed hyperlipidemia: Secondary | ICD-10-CM | POA: Diagnosis not present

## 2023-08-27 DIAGNOSIS — I2510 Atherosclerotic heart disease of native coronary artery without angina pectoris: Secondary | ICD-10-CM | POA: Diagnosis not present

## 2023-08-27 DIAGNOSIS — I10 Essential (primary) hypertension: Secondary | ICD-10-CM | POA: Diagnosis not present

## 2023-08-27 DIAGNOSIS — E1159 Type 2 diabetes mellitus with other circulatory complications: Secondary | ICD-10-CM | POA: Diagnosis not present

## 2023-09-26 DIAGNOSIS — E782 Mixed hyperlipidemia: Secondary | ICD-10-CM | POA: Diagnosis not present

## 2023-09-26 DIAGNOSIS — I2510 Atherosclerotic heart disease of native coronary artery without angina pectoris: Secondary | ICD-10-CM | POA: Diagnosis not present

## 2023-09-26 DIAGNOSIS — I10 Essential (primary) hypertension: Secondary | ICD-10-CM | POA: Diagnosis not present

## 2023-09-26 DIAGNOSIS — E1159 Type 2 diabetes mellitus with other circulatory complications: Secondary | ICD-10-CM | POA: Diagnosis not present

## 2023-10-24 DIAGNOSIS — H35352 Cystoid macular degeneration, left eye: Secondary | ICD-10-CM | POA: Diagnosis not present

## 2023-10-24 DIAGNOSIS — Z961 Presence of intraocular lens: Secondary | ICD-10-CM | POA: Diagnosis not present

## 2023-10-27 DIAGNOSIS — I2510 Atherosclerotic heart disease of native coronary artery without angina pectoris: Secondary | ICD-10-CM | POA: Diagnosis not present

## 2023-10-27 DIAGNOSIS — E1159 Type 2 diabetes mellitus with other circulatory complications: Secondary | ICD-10-CM | POA: Diagnosis not present

## 2023-10-27 DIAGNOSIS — E782 Mixed hyperlipidemia: Secondary | ICD-10-CM | POA: Diagnosis not present

## 2023-10-27 DIAGNOSIS — I10 Essential (primary) hypertension: Secondary | ICD-10-CM | POA: Diagnosis not present

## 2023-11-26 DIAGNOSIS — E1159 Type 2 diabetes mellitus with other circulatory complications: Secondary | ICD-10-CM | POA: Diagnosis not present

## 2023-11-26 DIAGNOSIS — E782 Mixed hyperlipidemia: Secondary | ICD-10-CM | POA: Diagnosis not present

## 2023-11-26 DIAGNOSIS — I10 Essential (primary) hypertension: Secondary | ICD-10-CM | POA: Diagnosis not present

## 2023-11-26 DIAGNOSIS — I2510 Atherosclerotic heart disease of native coronary artery without angina pectoris: Secondary | ICD-10-CM | POA: Diagnosis not present

## 2023-12-11 DIAGNOSIS — H35352 Cystoid macular degeneration, left eye: Secondary | ICD-10-CM | POA: Diagnosis not present

## 2023-12-11 DIAGNOSIS — Z961 Presence of intraocular lens: Secondary | ICD-10-CM | POA: Diagnosis not present

## 2023-12-27 DIAGNOSIS — E1159 Type 2 diabetes mellitus with other circulatory complications: Secondary | ICD-10-CM | POA: Diagnosis not present

## 2023-12-27 DIAGNOSIS — E782 Mixed hyperlipidemia: Secondary | ICD-10-CM | POA: Diagnosis not present

## 2023-12-27 DIAGNOSIS — I10 Essential (primary) hypertension: Secondary | ICD-10-CM | POA: Diagnosis not present

## 2023-12-27 DIAGNOSIS — I2510 Atherosclerotic heart disease of native coronary artery without angina pectoris: Secondary | ICD-10-CM | POA: Diagnosis not present

## 2024-01-09 DIAGNOSIS — H35352 Cystoid macular degeneration, left eye: Secondary | ICD-10-CM | POA: Diagnosis not present

## 2024-01-09 DIAGNOSIS — Z961 Presence of intraocular lens: Secondary | ICD-10-CM | POA: Diagnosis not present

## 2024-02-26 DIAGNOSIS — E782 Mixed hyperlipidemia: Secondary | ICD-10-CM | POA: Diagnosis not present

## 2024-02-26 DIAGNOSIS — I10 Essential (primary) hypertension: Secondary | ICD-10-CM | POA: Diagnosis not present

## 2024-02-26 DIAGNOSIS — K589 Irritable bowel syndrome without diarrhea: Secondary | ICD-10-CM | POA: Diagnosis not present

## 2024-02-26 DIAGNOSIS — G459 Transient cerebral ischemic attack, unspecified: Secondary | ICD-10-CM | POA: Diagnosis not present

## 2024-02-26 DIAGNOSIS — K219 Gastro-esophageal reflux disease without esophagitis: Secondary | ICD-10-CM | POA: Diagnosis not present

## 2024-02-26 DIAGNOSIS — I152 Hypertension secondary to endocrine disorders: Secondary | ICD-10-CM | POA: Diagnosis not present

## 2024-02-26 DIAGNOSIS — E1159 Type 2 diabetes mellitus with other circulatory complications: Secondary | ICD-10-CM | POA: Diagnosis not present

## 2024-02-26 DIAGNOSIS — M25562 Pain in left knee: Secondary | ICD-10-CM | POA: Diagnosis not present

## 2024-02-26 DIAGNOSIS — M19012 Primary osteoarthritis, left shoulder: Secondary | ICD-10-CM | POA: Diagnosis not present

## 2024-02-26 DIAGNOSIS — I2510 Atherosclerotic heart disease of native coronary artery without angina pectoris: Secondary | ICD-10-CM | POA: Diagnosis not present

## 2024-02-26 DIAGNOSIS — Z23 Encounter for immunization: Secondary | ICD-10-CM | POA: Diagnosis not present

## 2024-02-26 DIAGNOSIS — Z79899 Other long term (current) drug therapy: Secondary | ICD-10-CM | POA: Diagnosis not present

## 2024-02-26 DIAGNOSIS — E11649 Type 2 diabetes mellitus with hypoglycemia without coma: Secondary | ICD-10-CM | POA: Diagnosis not present

## 2024-02-26 DIAGNOSIS — E1165 Type 2 diabetes mellitus with hyperglycemia: Secondary | ICD-10-CM | POA: Diagnosis not present

## 2024-02-26 DIAGNOSIS — Z201 Contact with and (suspected) exposure to tuberculosis: Secondary | ICD-10-CM | POA: Diagnosis not present

## 2024-02-27 ENCOUNTER — Other Ambulatory Visit (HOSPITAL_COMMUNITY): Payer: Self-pay | Admitting: Family Medicine

## 2024-02-27 DIAGNOSIS — G459 Transient cerebral ischemic attack, unspecified: Secondary | ICD-10-CM

## 2024-03-06 DIAGNOSIS — Z1331 Encounter for screening for depression: Secondary | ICD-10-CM | POA: Diagnosis not present

## 2024-03-06 DIAGNOSIS — Z0000 Encounter for general adult medical examination without abnormal findings: Secondary | ICD-10-CM | POA: Diagnosis not present

## 2024-03-07 ENCOUNTER — Ambulatory Visit (HOSPITAL_COMMUNITY)
Admission: RE | Admit: 2024-03-07 | Discharge: 2024-03-07 | Disposition: A | Discharge status: Home / Self Care | Source: Ambulatory Visit | Attending: Vascular Surgery | Admitting: Vascular Surgery

## 2024-03-07 DIAGNOSIS — G459 Transient cerebral ischemic attack, unspecified: Secondary | ICD-10-CM | POA: Diagnosis not present

## 2024-03-08 ENCOUNTER — Other Ambulatory Visit (HOSPITAL_BASED_OUTPATIENT_CLINIC_OR_DEPARTMENT_OTHER): Payer: Self-pay | Admitting: Family Medicine

## 2024-03-08 DIAGNOSIS — Z780 Asymptomatic menopausal state: Secondary | ICD-10-CM

## 2024-03-11 DIAGNOSIS — Z1231 Encounter for screening mammogram for malignant neoplasm of breast: Secondary | ICD-10-CM | POA: Diagnosis not present

## 2024-03-27 DIAGNOSIS — H35352 Cystoid macular degeneration, left eye: Secondary | ICD-10-CM | POA: Diagnosis not present

## 2024-03-27 DIAGNOSIS — Z961 Presence of intraocular lens: Secondary | ICD-10-CM | POA: Diagnosis not present
